# Patient Record
Sex: Female | Born: 1975 | Race: Black or African American | Hispanic: No | Marital: Married | Smoking: Never smoker
Health system: Southern US, Community
[De-identification: ages and names within clinical notes are randomized; demographics above are authoritative.]

## PROBLEM LIST (undated history)

## (undated) DIAGNOSIS — G609 Hereditary and idiopathic neuropathy, unspecified: Secondary | ICD-10-CM

## (undated) DIAGNOSIS — M2241 Chondromalacia patellae, right knee: Secondary | ICD-10-CM

## (undated) DIAGNOSIS — L299 Pruritus, unspecified: Secondary | ICD-10-CM

## (undated) DIAGNOSIS — M2240 Chondromalacia patellae, unspecified knee: Secondary | ICD-10-CM

## (undated) DIAGNOSIS — R600 Localized edema: Secondary | ICD-10-CM

## (undated) DIAGNOSIS — F329 Major depressive disorder, single episode, unspecified: Secondary | ICD-10-CM

## (undated) DIAGNOSIS — R112 Nausea with vomiting, unspecified: Secondary | ICD-10-CM

## (undated) DIAGNOSIS — R252 Cramp and spasm: Secondary | ICD-10-CM

## (undated) DIAGNOSIS — K219 Gastro-esophageal reflux disease without esophagitis: Secondary | ICD-10-CM

## (undated) DIAGNOSIS — D509 Iron deficiency anemia, unspecified: Secondary | ICD-10-CM

## (undated) DIAGNOSIS — D649 Anemia, unspecified: Secondary | ICD-10-CM

## (undated) DIAGNOSIS — M1990 Unspecified osteoarthritis, unspecified site: Secondary | ICD-10-CM

## (undated) DIAGNOSIS — R200 Anesthesia of skin: Secondary | ICD-10-CM

## (undated) DIAGNOSIS — G4733 Obstructive sleep apnea (adult) (pediatric): Secondary | ICD-10-CM

## (undated) DIAGNOSIS — J309 Allergic rhinitis, unspecified: Secondary | ICD-10-CM

## (undated) DIAGNOSIS — IMO0001 Reserved for inherently not codable concepts without codable children: Secondary | ICD-10-CM

## (undated) DIAGNOSIS — R109 Unspecified abdominal pain: Secondary | ICD-10-CM

## (undated) DIAGNOSIS — L2089 Other atopic dermatitis: Secondary | ICD-10-CM

## (undated) DIAGNOSIS — F4321 Adjustment disorder with depressed mood: Secondary | ICD-10-CM

## (undated) DIAGNOSIS — M25569 Pain in unspecified knee: Secondary | ICD-10-CM

## (undated) DIAGNOSIS — R202 Paresthesia of skin: Secondary | ICD-10-CM

## (undated) DIAGNOSIS — F502 Bulimia nervosa: Secondary | ICD-10-CM

## (undated) DIAGNOSIS — Z8719 Personal history of other diseases of the digestive system: Secondary | ICD-10-CM

## (undated) DIAGNOSIS — M722 Plantar fascial fibromatosis: Secondary | ICD-10-CM

## (undated) DIAGNOSIS — R011 Cardiac murmur, unspecified: Secondary | ICD-10-CM

## (undated) DIAGNOSIS — R231 Pallor: Secondary | ICD-10-CM

## (undated) DIAGNOSIS — Z9889 Other specified postprocedural states: Secondary | ICD-10-CM

## (undated) DIAGNOSIS — L281 Prurigo nodularis: Secondary | ICD-10-CM

## (undated) DIAGNOSIS — M75100 Unspecified rotator cuff tear or rupture of unspecified shoulder, not specified as traumatic: Secondary | ICD-10-CM

## (undated) HISTORY — DX: Unspecified rotator cuff tear or rupture of unspecified shoulder, not specified as traumatic: M75.100

## (undated) HISTORY — DX: Pallor: R23.1

## (undated) HISTORY — DX: Anemia, unspecified: D64.9

## (undated) HISTORY — DX: Hereditary and idiopathic neuropathy, unspecified: G60.9

## (undated) HISTORY — DX: Pruritus, unspecified: L29.9

## (undated) HISTORY — DX: Cramp and spasm: R25.2

## (undated) HISTORY — DX: Allergic rhinitis, unspecified: J30.9

## (undated) HISTORY — DX: Bulimia nervosa: F50.2

## (undated) HISTORY — DX: Paresthesia of skin: R20.2

## (undated) HISTORY — DX: Other atopic dermatitis: L20.89

## (undated) HISTORY — DX: Pain in unspecified knee: M25.569

## (undated) HISTORY — DX: Prurigo nodularis: L28.1

## (undated) HISTORY — DX: Major depressive disorder, single episode, unspecified: F32.9

## (undated) HISTORY — DX: Reserved for inherently not codable concepts without codable children: IMO0001

## (undated) HISTORY — DX: Unspecified abdominal pain: R10.9

## (undated) HISTORY — DX: Adjustment disorder with depressed mood: F43.21

## (undated) HISTORY — DX: Plantar fascial fibromatosis: M72.2

---

## 1997-05-03 ENCOUNTER — Inpatient Hospital Stay (HOSPITAL_COMMUNITY): Admission: AD | Admit: 1997-05-03 | Discharge: 1997-05-03 | Payer: Self-pay | Admitting: Obstetrics & Gynecology

## 1997-05-09 ENCOUNTER — Encounter (HOSPITAL_COMMUNITY): Admission: RE | Admit: 1997-05-09 | Discharge: 1997-05-12 | Payer: Self-pay | Admitting: Obstetrics & Gynecology

## 1997-05-12 ENCOUNTER — Inpatient Hospital Stay (HOSPITAL_COMMUNITY): Admission: AD | Admit: 1997-05-12 | Discharge: 1997-05-15 | Payer: Self-pay | Admitting: *Deleted

## 1997-07-20 ENCOUNTER — Encounter: Admission: RE | Admit: 1997-07-20 | Discharge: 1997-07-20 | Payer: Self-pay | Admitting: Family Medicine

## 1997-10-02 ENCOUNTER — Emergency Department (HOSPITAL_COMMUNITY): Admission: EM | Admit: 1997-10-02 | Discharge: 1997-10-02 | Payer: Self-pay | Admitting: Emergency Medicine

## 1998-01-30 ENCOUNTER — Encounter: Admission: RE | Admit: 1998-01-30 | Discharge: 1998-01-30 | Payer: Self-pay | Admitting: Family Medicine

## 1998-02-20 ENCOUNTER — Encounter: Payer: Self-pay | Admitting: Emergency Medicine

## 1998-02-20 ENCOUNTER — Emergency Department (HOSPITAL_COMMUNITY): Admission: EM | Admit: 1998-02-20 | Discharge: 1998-02-20 | Payer: Self-pay | Admitting: Emergency Medicine

## 1998-02-24 ENCOUNTER — Encounter: Admission: RE | Admit: 1998-02-24 | Discharge: 1998-02-24 | Payer: Self-pay | Admitting: Family Medicine

## 1998-04-17 ENCOUNTER — Encounter: Admission: RE | Admit: 1998-04-17 | Discharge: 1998-04-17 | Payer: Self-pay | Admitting: Family Medicine

## 1999-02-06 ENCOUNTER — Emergency Department (HOSPITAL_COMMUNITY): Admission: EM | Admit: 1999-02-06 | Discharge: 1999-02-06 | Payer: Self-pay | Admitting: Emergency Medicine

## 1999-03-02 ENCOUNTER — Emergency Department (HOSPITAL_COMMUNITY): Admission: EM | Admit: 1999-03-02 | Discharge: 1999-03-02 | Payer: Self-pay | Admitting: Emergency Medicine

## 1999-03-02 ENCOUNTER — Encounter: Payer: Self-pay | Admitting: Emergency Medicine

## 1999-03-14 ENCOUNTER — Encounter: Admission: RE | Admit: 1999-03-14 | Discharge: 1999-03-14 | Payer: Self-pay | Admitting: Family Medicine

## 1999-07-20 ENCOUNTER — Emergency Department (HOSPITAL_COMMUNITY): Admission: EM | Admit: 1999-07-20 | Discharge: 1999-07-20 | Payer: Self-pay | Admitting: Emergency Medicine

## 1999-09-10 ENCOUNTER — Encounter: Admission: RE | Admit: 1999-09-10 | Discharge: 1999-09-10 | Payer: Self-pay | Admitting: Family Medicine

## 2001-02-03 ENCOUNTER — Encounter: Admission: RE | Admit: 2001-02-03 | Discharge: 2001-02-03 | Payer: Self-pay | Admitting: Family Medicine

## 2001-05-21 ENCOUNTER — Encounter: Admission: RE | Admit: 2001-05-21 | Discharge: 2001-05-21 | Payer: Self-pay | Admitting: Family Medicine

## 2001-08-17 ENCOUNTER — Encounter: Admission: RE | Admit: 2001-08-17 | Discharge: 2001-08-17 | Payer: Self-pay | Admitting: Sports Medicine

## 2001-08-26 ENCOUNTER — Encounter: Admission: RE | Admit: 2001-08-26 | Discharge: 2001-08-26 | Payer: Self-pay | Admitting: Family Medicine

## 2001-09-23 ENCOUNTER — Encounter: Admission: RE | Admit: 2001-09-23 | Discharge: 2001-09-23 | Payer: Self-pay | Admitting: Family Medicine

## 2002-02-03 ENCOUNTER — Encounter: Admission: RE | Admit: 2002-02-03 | Discharge: 2002-02-03 | Payer: Self-pay | Admitting: Family Medicine

## 2002-03-24 ENCOUNTER — Emergency Department (HOSPITAL_COMMUNITY): Admission: EM | Admit: 2002-03-24 | Discharge: 2002-03-24 | Payer: Self-pay | Admitting: Emergency Medicine

## 2002-06-23 ENCOUNTER — Encounter: Admission: RE | Admit: 2002-06-23 | Discharge: 2002-06-23 | Payer: Self-pay | Admitting: Family Medicine

## 2002-06-24 ENCOUNTER — Encounter: Admission: RE | Admit: 2002-06-24 | Discharge: 2002-06-24 | Payer: Self-pay | Admitting: Family Medicine

## 2003-01-31 ENCOUNTER — Encounter: Admission: RE | Admit: 2003-01-31 | Discharge: 2003-01-31 | Payer: Self-pay | Admitting: Family Medicine

## 2003-02-09 ENCOUNTER — Inpatient Hospital Stay (HOSPITAL_COMMUNITY): Admission: AD | Admit: 2003-02-09 | Discharge: 2003-02-09 | Payer: Self-pay | Admitting: Obstetrics and Gynecology

## 2003-02-16 ENCOUNTER — Other Ambulatory Visit: Admission: RE | Admit: 2003-02-16 | Discharge: 2003-02-16 | Payer: Self-pay | Admitting: Family Medicine

## 2003-02-16 ENCOUNTER — Encounter: Admission: RE | Admit: 2003-02-16 | Discharge: 2003-02-16 | Payer: Self-pay | Admitting: Family Medicine

## 2003-03-22 ENCOUNTER — Encounter: Admission: RE | Admit: 2003-03-22 | Discharge: 2003-03-22 | Payer: Self-pay | Admitting: Family Medicine

## 2003-04-15 ENCOUNTER — Inpatient Hospital Stay (HOSPITAL_COMMUNITY): Admission: AD | Admit: 2003-04-15 | Discharge: 2003-04-15 | Payer: Self-pay | Admitting: *Deleted

## 2003-04-22 ENCOUNTER — Encounter: Admission: RE | Admit: 2003-04-22 | Discharge: 2003-04-22 | Payer: Self-pay | Admitting: Family Medicine

## 2003-04-28 ENCOUNTER — Ambulatory Visit (HOSPITAL_COMMUNITY): Admission: RE | Admit: 2003-04-28 | Discharge: 2003-04-28 | Payer: Self-pay | Admitting: Sports Medicine

## 2003-05-23 ENCOUNTER — Encounter: Admission: RE | Admit: 2003-05-23 | Discharge: 2003-05-23 | Payer: Self-pay | Admitting: Family Medicine

## 2003-06-06 ENCOUNTER — Encounter: Admission: RE | Admit: 2003-06-06 | Discharge: 2003-06-06 | Payer: Self-pay | Admitting: Family Medicine

## 2003-07-04 ENCOUNTER — Encounter: Admission: RE | Admit: 2003-07-04 | Discharge: 2003-07-04 | Payer: Self-pay | Admitting: Family Medicine

## 2003-07-18 ENCOUNTER — Encounter: Admission: RE | Admit: 2003-07-18 | Discharge: 2003-07-18 | Payer: Self-pay | Admitting: Family Medicine

## 2003-07-22 ENCOUNTER — Inpatient Hospital Stay (HOSPITAL_COMMUNITY): Admission: AD | Admit: 2003-07-22 | Discharge: 2003-07-22 | Payer: Self-pay | Admitting: Obstetrics and Gynecology

## 2003-08-03 ENCOUNTER — Encounter: Admission: RE | Admit: 2003-08-03 | Discharge: 2003-08-03 | Payer: Self-pay | Admitting: Family Medicine

## 2003-08-15 ENCOUNTER — Encounter: Admission: RE | Admit: 2003-08-15 | Discharge: 2003-08-15 | Payer: Self-pay | Admitting: Family Medicine

## 2003-08-23 ENCOUNTER — Encounter: Admission: RE | Admit: 2003-08-23 | Discharge: 2003-08-23 | Payer: Self-pay | Admitting: Family Medicine

## 2003-08-23 ENCOUNTER — Inpatient Hospital Stay (HOSPITAL_COMMUNITY): Admission: AD | Admit: 2003-08-23 | Discharge: 2003-08-24 | Payer: Self-pay | Admitting: *Deleted

## 2003-08-25 ENCOUNTER — Encounter: Admission: RE | Admit: 2003-08-25 | Discharge: 2003-08-25 | Payer: Self-pay | Admitting: Family Medicine

## 2003-08-29 ENCOUNTER — Encounter: Admission: RE | Admit: 2003-08-29 | Discharge: 2003-08-29 | Payer: Self-pay | Admitting: *Deleted

## 2003-09-01 ENCOUNTER — Encounter: Admission: RE | Admit: 2003-09-01 | Discharge: 2003-09-01 | Payer: Self-pay | Admitting: Family Medicine

## 2003-09-01 ENCOUNTER — Inpatient Hospital Stay (HOSPITAL_COMMUNITY): Admission: AD | Admit: 2003-09-01 | Discharge: 2003-09-01 | Payer: Self-pay | Admitting: Family Medicine

## 2003-09-05 ENCOUNTER — Encounter: Admission: RE | Admit: 2003-09-05 | Discharge: 2003-09-05 | Payer: Self-pay | Admitting: Obstetrics & Gynecology

## 2003-09-08 ENCOUNTER — Encounter: Admission: RE | Admit: 2003-09-08 | Discharge: 2003-09-08 | Payer: Self-pay | Admitting: Family Medicine

## 2003-09-12 ENCOUNTER — Inpatient Hospital Stay (HOSPITAL_COMMUNITY): Admission: AD | Admit: 2003-09-12 | Discharge: 2003-09-19 | Payer: Self-pay | Admitting: Family Medicine

## 2003-09-12 ENCOUNTER — Encounter: Admission: RE | Admit: 2003-09-12 | Discharge: 2003-09-12 | Payer: Self-pay | Admitting: *Deleted

## 2003-09-16 ENCOUNTER — Encounter (INDEPENDENT_AMBULATORY_CARE_PROVIDER_SITE_OTHER): Payer: Self-pay | Admitting: Specialist

## 2003-09-16 HISTORY — PX: TUBAL LIGATION: SHX77

## 2003-09-22 ENCOUNTER — Encounter: Admission: RE | Admit: 2003-09-22 | Discharge: 2003-09-22 | Payer: Self-pay | Admitting: Family Medicine

## 2003-10-21 ENCOUNTER — Encounter: Admission: RE | Admit: 2003-10-21 | Discharge: 2003-10-21 | Payer: Self-pay | Admitting: Family Medicine

## 2006-05-08 ENCOUNTER — Emergency Department (HOSPITAL_COMMUNITY): Admission: EM | Admit: 2006-05-08 | Discharge: 2006-05-08 | Payer: Self-pay | Admitting: Family Medicine

## 2006-05-13 ENCOUNTER — Ambulatory Visit: Payer: Self-pay | Admitting: Family Medicine

## 2006-06-09 ENCOUNTER — Emergency Department (HOSPITAL_COMMUNITY): Admission: EM | Admit: 2006-06-09 | Discharge: 2006-06-09 | Payer: Self-pay | Admitting: Emergency Medicine

## 2006-06-24 ENCOUNTER — Emergency Department (HOSPITAL_COMMUNITY): Admission: EM | Admit: 2006-06-24 | Discharge: 2006-06-25 | Payer: Self-pay | Admitting: Emergency Medicine

## 2007-03-11 ENCOUNTER — Emergency Department (HOSPITAL_COMMUNITY): Admission: EM | Admit: 2007-03-11 | Discharge: 2007-03-11 | Payer: Self-pay | Admitting: Family Medicine

## 2007-06-24 ENCOUNTER — Emergency Department (HOSPITAL_COMMUNITY): Admission: EM | Admit: 2007-06-24 | Discharge: 2007-06-24 | Payer: Self-pay | Admitting: Emergency Medicine

## 2008-01-17 ENCOUNTER — Emergency Department (HOSPITAL_COMMUNITY): Admission: EM | Admit: 2008-01-17 | Discharge: 2008-01-17 | Payer: Self-pay | Admitting: Family Medicine

## 2008-07-06 ENCOUNTER — Ambulatory Visit: Payer: Self-pay | Admitting: Family Medicine

## 2008-07-06 DIAGNOSIS — J309 Allergic rhinitis, unspecified: Secondary | ICD-10-CM

## 2008-07-06 DIAGNOSIS — L299 Pruritus, unspecified: Secondary | ICD-10-CM

## 2008-07-06 HISTORY — DX: Allergic rhinitis, unspecified: J30.9

## 2008-07-06 HISTORY — DX: Morbid (severe) obesity due to excess calories: E66.01

## 2008-07-06 HISTORY — DX: Pruritus, unspecified: L29.9

## 2008-08-04 ENCOUNTER — Ambulatory Visit: Payer: Self-pay | Admitting: Family Medicine

## 2008-10-31 ENCOUNTER — Emergency Department (HOSPITAL_COMMUNITY): Admission: EM | Admit: 2008-10-31 | Discharge: 2008-10-31 | Payer: Self-pay | Admitting: Emergency Medicine

## 2008-11-01 ENCOUNTER — Encounter (INDEPENDENT_AMBULATORY_CARE_PROVIDER_SITE_OTHER): Payer: Self-pay | Admitting: *Deleted

## 2008-11-01 ENCOUNTER — Ambulatory Visit: Payer: Self-pay | Admitting: Sports Medicine

## 2008-11-01 DIAGNOSIS — M25569 Pain in unspecified knee: Secondary | ICD-10-CM | POA: Insufficient documentation

## 2008-11-01 HISTORY — DX: Pain in unspecified knee: M25.569

## 2009-06-08 ENCOUNTER — Emergency Department (HOSPITAL_COMMUNITY): Admission: EM | Admit: 2009-06-08 | Discharge: 2009-06-08 | Payer: Self-pay | Admitting: Family Medicine

## 2009-07-04 ENCOUNTER — Telehealth: Payer: Self-pay | Admitting: Family Medicine

## 2009-07-04 ENCOUNTER — Ambulatory Visit: Payer: Self-pay | Admitting: Family Medicine

## 2009-07-04 DIAGNOSIS — IMO0002 Reserved for concepts with insufficient information to code with codable children: Secondary | ICD-10-CM | POA: Insufficient documentation

## 2009-07-04 DIAGNOSIS — F4321 Adjustment disorder with depressed mood: Secondary | ICD-10-CM

## 2009-07-04 DIAGNOSIS — R252 Cramp and spasm: Secondary | ICD-10-CM

## 2009-07-04 DIAGNOSIS — R109 Unspecified abdominal pain: Secondary | ICD-10-CM

## 2009-07-04 DIAGNOSIS — M75100 Unspecified rotator cuff tear or rupture of unspecified shoulder, not specified as traumatic: Secondary | ICD-10-CM

## 2009-07-04 HISTORY — DX: Cramp and spasm: R25.2

## 2009-07-04 HISTORY — DX: Unspecified rotator cuff tear or rupture of unspecified shoulder, not specified as traumatic: M75.100

## 2009-07-04 HISTORY — DX: Unspecified abdominal pain: R10.9

## 2009-07-04 HISTORY — DX: Adjustment disorder with depressed mood: F43.21

## 2009-07-04 HISTORY — DX: Reserved for concepts with insufficient information to code with codable children: IMO0002

## 2009-07-05 ENCOUNTER — Encounter: Payer: Self-pay | Admitting: Family Medicine

## 2009-07-05 ENCOUNTER — Ambulatory Visit: Payer: Self-pay | Admitting: Family Medicine

## 2009-07-05 LAB — CONVERTED CEMR LAB
BUN: 15 mg/dL (ref 6–23)
Chloride: 103 meq/L (ref 96–112)
Potassium: 4.1 meq/L (ref 3.5–5.3)

## 2009-07-06 ENCOUNTER — Encounter: Payer: Self-pay | Admitting: Family Medicine

## 2009-07-07 ENCOUNTER — Telehealth: Payer: Self-pay | Admitting: Psychology

## 2009-07-08 ENCOUNTER — Encounter: Payer: Self-pay | Admitting: Family Medicine

## 2009-07-17 ENCOUNTER — Ambulatory Visit: Payer: Self-pay | Admitting: Family Medicine

## 2009-07-17 DIAGNOSIS — R231 Pallor: Secondary | ICD-10-CM | POA: Insufficient documentation

## 2009-07-17 HISTORY — DX: Pallor: R23.1

## 2009-07-17 LAB — CONVERTED CEMR LAB
CO2: 24 meq/L (ref 19–32)
Creatinine, Ser: 1.16 mg/dL (ref 0.40–1.20)
Glucose, Bld: 97 mg/dL (ref 70–99)
HCT: 35.4 % — ABNORMAL LOW (ref 36.0–46.0)
MCV: 73.8 fL — ABNORMAL LOW (ref 78.0–100.0)
Platelets: 280 10*3/uL (ref 150–400)
RBC: 4.8 M/uL (ref 3.87–5.11)
TSH: 0.747 microintl units/mL (ref 0.350–4.500)
Total Bilirubin: 0.5 mg/dL (ref 0.3–1.2)
WBC: 7 10*3/uL (ref 4.0–10.5)

## 2009-07-20 ENCOUNTER — Encounter: Admission: RE | Admit: 2009-07-20 | Discharge: 2009-07-20 | Payer: Self-pay | Admitting: Family Medicine

## 2009-08-09 ENCOUNTER — Encounter (INDEPENDENT_AMBULATORY_CARE_PROVIDER_SITE_OTHER): Payer: Self-pay | Admitting: *Deleted

## 2009-08-09 ENCOUNTER — Ambulatory Visit: Payer: Self-pay | Admitting: Family Medicine

## 2009-08-09 DIAGNOSIS — F3289 Other specified depressive episodes: Secondary | ICD-10-CM

## 2009-08-09 DIAGNOSIS — F329 Major depressive disorder, single episode, unspecified: Secondary | ICD-10-CM | POA: Insufficient documentation

## 2009-08-09 HISTORY — DX: Major depressive disorder, single episode, unspecified: F32.9

## 2009-08-09 HISTORY — DX: Other specified depressive episodes: F32.89

## 2009-08-14 ENCOUNTER — Encounter: Payer: Self-pay | Admitting: Family Medicine

## 2009-08-17 ENCOUNTER — Ambulatory Visit: Payer: Self-pay | Admitting: Family Medicine

## 2009-08-22 ENCOUNTER — Ambulatory Visit: Payer: Self-pay | Admitting: Family Medicine

## 2009-08-22 DIAGNOSIS — F502 Bulimia nervosa, unspecified: Secondary | ICD-10-CM

## 2009-08-22 HISTORY — DX: Bulimia nervosa: F50.2

## 2009-08-22 HISTORY — DX: Bulimia nervosa, unspecified: F50.20

## 2009-08-25 ENCOUNTER — Telehealth: Payer: Self-pay | Admitting: Family Medicine

## 2009-08-29 ENCOUNTER — Telehealth: Payer: Self-pay | Admitting: Family Medicine

## 2009-08-30 ENCOUNTER — Telehealth: Payer: Self-pay | Admitting: *Deleted

## 2009-08-30 ENCOUNTER — Ambulatory Visit: Payer: Self-pay | Admitting: Family Medicine

## 2009-08-30 DIAGNOSIS — L2089 Other atopic dermatitis: Secondary | ICD-10-CM | POA: Insufficient documentation

## 2009-08-30 HISTORY — DX: Other atopic dermatitis: L20.89

## 2009-08-30 LAB — CONVERTED CEMR LAB: Vitamin B-12: 350 pg/mL (ref 211–911)

## 2009-08-31 ENCOUNTER — Telehealth: Payer: Self-pay | Admitting: *Deleted

## 2009-09-08 ENCOUNTER — Telehealth: Payer: Self-pay | Admitting: Family Medicine

## 2009-09-13 ENCOUNTER — Telehealth: Payer: Self-pay | Admitting: Family Medicine

## 2009-11-06 ENCOUNTER — Telehealth: Payer: Self-pay | Admitting: Family Medicine

## 2009-11-14 ENCOUNTER — Telehealth: Payer: Self-pay | Admitting: Family Medicine

## 2009-11-29 ENCOUNTER — Telehealth: Payer: Self-pay | Admitting: Family Medicine

## 2010-04-24 NOTE — Progress Notes (Signed)
Summary: called to check on pt  Phone Note Outgoing Call   Call placed by: Kraven Calk Swaziland MD,  November 14, 2009 1:48 PM Summary of Call: Called pt to follow up on call last week.  LM to call us if we can help with anything.

## 2010-04-24 NOTE — Progress Notes (Signed)
Summary: wants shoulder injection, other issues  Phone Note Call from Patient   Caller: Patient Call For: Nia Nathaniel [place] MD Summary of Call: Please call at your earliest convenience.  Called HR dept and they informed me that I have to be out within 7 consecutive days and was given approval for my medical leave.  They will fax papers for you to sign and fax back.  I need you to give me a call for further questions that I have regarding bilat leg spasms.  You can reach me at [phone].    Initial call taken by: Britta Mccreedy mcgregor  Follow-up for Phone Call        she is aware of appt this pm with Dr. Rexene Alberts for a shoulder injection per Dr. Swaziland. she will keep the appt Follow-up by: Golden Circle RN,  July 05, 2009 9:02 AM  Additional Follow-up for Phone Call Additional follow up Details #1::        Pt calls with several issues: 1)Wants to have shoulder injected today. Pain was worse last night.  Will schedule appt as work in today. 2) In addition to abd. pain, having very foul smelling gas. Would like GI referral.   No diarrhea, constipation, vomiting.  + frequent stools.  No blood or melena.  Gas started a few days after she started colon cleanse, which she stopped due to pain and gas.  No change with eating.  I think this may be related to colon cleanse, perhaps brush border disrupted.  Try yogurt with active cultures for several days and let us know if no relief.   3) Muscle spasms.  Has cramping in legs and feet.  Occurs several times a week, no exacerbating factors.  Improved with warm rags on legs or massage.  Occurs B.  Very painful.  Pt wonders if her potassium levels are off.  Will check BMP today when she comes in.   4)She  discussed her situation with HR at work.  She needs to be off for 7 days before leave can start.  THey will fax paperwork to [place].  I am ok with doing some FMLA for this pt to give her time to get established with therapy and to grieve as her grief is affecting her  ability to function.  Additional Follow-up by: Arrianna Catala [place] MD,  July 05, 2009 10:13 AM  New Problems: MUSCLE CRAMPS (ICD-729.82)   New Problems: MUSCLE CRAMPS (ICD-729.82)

## 2010-04-24 NOTE — Letter (Signed)
Summary: normal lab  [facility]  [address]   [place], [place] [address]   Phone: [phone]  Fax: [phone]    07/06/2009  [facility] [address] [place], [place]  [address]  Dear Ms. KANG, I am happy to tell you that your labs were normal, including your potassium.  For the leg pain, you can try the stretches that I have included here.  You should also make sure you are drinking plenty of water.  If that doesn't help, you might want to try Vitamin B6, 30 mg a day to see if that improves your pain. Please let us know if you need anything.     Sincerely,   Malik Paar [place] MD

## 2010-04-24 NOTE — Progress Notes (Signed)
Summary: left message for pt.  Phone Note Outgoing Call   Summary of Call: Attempted to call pt.  LM on home VM.  I tried the other number that she left with Phillis Haggis, but it went to someone else's VM at work, so did not leave a message. Initial call taken by: Lilie Vezina Swaz[place] MD,  November 29, 2009 12:10 PM

## 2010-04-24 NOTE — Progress Notes (Signed)
Summary: Grief counseling  Phone Note Call from Patient   Caller: Patient Call For: Spero Geralds, Psy.D. Summary of Call: Patient called to schedule appt per Dr. Elvis Coil request.  Explained that I don't see family members of employees because of boundary issues.  She said she understood this completely.  Recommended Hospice because of the great work they do with grief however patient had a bad experience with them related to a family friend.  Discussed other options.  She elected to call her insurance company for a list of providers.  She will either contact some by phone or call me for a recommendation.  Will alert Dr. Swaziland. Initial call taken by: Spero Geralds PsyD,  July 07, 2009 11:28 AM

## 2010-04-24 NOTE — Progress Notes (Signed)
Summary: phn msg  Phone Note Call from Patient Call back at [phone]   Caller: Patient Summary of Call: Just needs to talk to Dr. Swaziland about something. Initial call taken by: Clydell Hakim,  August 25, 2009 11:29 AM  Follow-up for Phone Call        Called pt.  No answer.  Left message to call clinic. Pt does have other appts today. Follow-up by: Oksana Deberry [place] MD,  August 28, 2009 10:52 AM    forward to pcp.Loralee Pacas CMA  August 25, 2009 12:19 PM'

## 2010-04-24 NOTE — Assessment & Plan Note (Signed)
Summary: F/U VISIT/[facility]   Vital Signs:  Patient profile:   35 year old female Height:      66.5 inches Weight:      281 pounds BMI:     44.84 Pulse rate:   80 / minute BP sitting:   120 / 80  (left arm)  Vitals Entered By: Arlyss Repress CMA, (Aug 17, 2009 10:19 AM) CC: f/up shoulder pain and FMLA form Is Patient Diabetic? No Pain Assessment Patient in pain? yes     Location: shoulder Intensity: 8 Onset of pain  Chronic   Primary Care Provider:  Cyndia Bent MD  CC:  f/up shoulder pain and FMLA form.  History of Present Illness: PT has not started the medicine for financial reasons.  Lots of co-pays coming up.  Has appt with ortho in June.  Still with lots of pain in shoulder. Still seeing Ms. Leff.  Has chart of activities and charting moods.  Reports her mood hit severe category on lots of parameters from Ms. Leff.  Still voices, this am had them at 3 am.  Tries to write when she has episodes.  Voices positive and tell her to snap out of this. Some days are really tough, and some are ok.  Still with crying spells.  + spending spree at home.  No SI/HI.    + angry feeling at times.     Habits & Providers  Alcohol-Tobacco-Diet     Tobacco Status: never  Allergies: No Known Drug Allergies  Review of Systems       see HPI  Physical Exam  General:  Well-developed,obese,in no acute distress; alert,appropriate and cooperative throughout examination Psych:  Pt reports she made an effort this morning, and she appears more dressed up (hair done, make-up) than in previous visits.  Still with some prolonged eye-contact and somewhat flat affect.  Non-labile.  Nl TC and TP without FOI or LOA.  Denies SI/HI   Impression & Recommendations:  Problem # 1:  DEPRESSION, SEVERE (ICD-311)  Pt still with psychotic features given her auditory hallucinations.  She does not want to take meds, but is willing to try them.  Has fears that she will be on them forever and of weight gain.  I  emphasized that the meds are very important.  Recent spendind sprees concerning for mania.  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  Also with eating disorder, but that has not been addressed yet in therapy.  Pt contracts for safety.  Orders: [facility]- Est Level  3 ([address])  Problem # 2:  SUBACROMIAL BURSITIS, RIGHT (ICD-726.19) F/u ortho.  Complete Medication List: 1)  Fluticasone Propionate 50 Mcg/act Susp (Fluticasone propionate) .... 2 sprays each nostril once daily 2)  Zyrtec Allergy 10 Mg Tabs (Cetirizine hcl) .Marland Kitchen.. 1 once daily prn 3)  Abilify 15 Mg Tabs (Aripiprazole) .... Take 1 by mouth daily  Patient Instructions: 1)  Please come back and see me in 1 week.   2)  It is important to get started on the meds.  3)  Let us know if you are having any problems.

## 2010-04-24 NOTE — Letter (Signed)
Summary: Generic Letter  [facility]  [address]   [place], [place] [address]   Phone: [phone]  Fax: [phone]    08/09/2009  [facility] [place] [address] [place], [place]  [address]  Dear Ms. SIGMUND,  Dr Swaziland wanted you to have an appointment with an Orthopedic doctor.  I have set you up an appointment on June 7th at [facility] with [facility].  Their phone number is [phone], you can call them for directions to their office or if you need to change this appointment.  I was not able to reach you by phone due to your mailbox being full.  Sincerely,   Gladstone Pih

## 2010-04-24 NOTE — Assessment & Plan Note (Signed)
Summary: follow up   Vital Signs:  Patient profile:   35 year old female Height:      66.5 inches Weight:      275.8 pounds BMI:     44.01 Temp:     98.2 degrees F oral Pulse rate:   90 / minute BP sitting:   112 / 80  (left arm) Cuff size:   large  Vitals Entered By: Gladstone Pih (Aug 22, 2009 9:14 AM) CC: F/U Is Patient Diabetic? No   Primary Care Provider:  Sarah Swaziland MD  CC:  F/U.  History of Present Illness: 35 yo female here to follow up.  Still feeling down, especally angry.  +Anhedonia, sleep problems, difficulty concentrating, no guilt, decreased energy.  Denies SI/HI.  Did not get meds because her disability payment from work was not processed yet.  Still binging/purging.  Vomits to prevent weight gain.   Husband has taken credit card away because she spent lots of money in her account.  Pt also reports episodes of cooking lots of food and then throwing it away so she won't eat it.    Reports feeling really angry - at co-workers for not sending cards - "I know that means they don't care", husband supportive, but she feels angry at him.  This is distressing to her because she used to be very comforted by him, but now does not want to be around him - it makes her angry that his is happy at times. He is home from work today because he worked this weekend.  Had intercourse recently and he did not ejaculate - wonders if this means he is cheating on her.    Pt still enjoys being with her daughter.  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  Daughter was home yesterday for holiday and pt reports they laughed all day, and she really cheers her up.  Daughter did stay home from school one day recently to be with pt.    Pt very concerned about weight gain and appearance.  "I feel like people are looking at me"  "I am really embarrassed because I don't look right".  Pt states that after her last visit  (when she dressed up), she got home and was very embarrassed because her eye shadow was not right and that she really felt like people were looking at her.    Pt did go walking with her mother last week, and felt good about that.  Pt asking if B12 shots could help get the weight off, if surgery could help.   Pt does not want to talk about voices.  Reports she is having images of her niece on the floor when she took her son to a doctors appt.    Continued:  Pt denies SI/HI, but states that when she gets the meds, she may take extra to get better quicker.  She intially states she doesn't know how many she would take, but then states she would not take more than 2. Repeatedly denies any thoughts of hurting self.  Pt reports a friend went to [employer], and she never wants to go there.  "They lock the doors and don't let your family visit".     Habits & Providers  Alcohol-Tobacco-Diet     Tobacco Status: never  Allergies: No Known Drug Allergies  Physical Exam  Psych:  Appropriate grooming and dress, but  not as dressed up as last visit (no make-up and hair not styled as she had done last visit).  See HPI for TC.  TP appropriate without FOI/LOA.  Speech not pressured.  +fidgeting during visit.  Does not appearing to be responding to internal stimuli.  Flat affect.  Guarded today.   Impression & Recommendations:  Problem # 1:  DEPRESSION, SEVERE (ICD-311)  I still think this is depression or perhaps BPAD with psychotic features.  Pt with some worsening of psychotic features, but not willing to discuss much today.  Has not started meds - will try to get today.  Voices understanding that taking more meds at once will be harmful and not helpful and agrees to take no more than 2.  Pt frustrated at the idea that it will likely take weeks to months for depression to improve.  Pt with poor insight into disease.  She does not like me to use the term "depression".   Pt would likely benefit from  hospitalization to stabilize and start and titrate meds, but she declined this.  At this point, she denies any SI/HI.  She contracts with safety and reports she will call Arletta Bale or our practice of go to ED if needed.  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  Pt interested in having this fixed, mostly in losing weight.  Asking about injections and surgery.  Surgery would not be an option for Ms. Colon until her binging/purging under control and her underlying psych condition improved.    Orders: [facility]- Est Level  3 ([address])  Problem # 3:  OBESITY, UNSPECIFIED (ICD-278.00) Pt asking what to do about weight.  Advised that we need to get depression under control and then work on this.  Encouraged daily walking.    Complete Medication List: 1)  Fluticasone Propionate 50 Mcg/act Susp (Fluticasone propionate) .... 2 sprays each nostril once daily 2)  Zyrtec Allergy 10 Mg Tabs (Cetirizine hcl) .Marland Kitchen.. 1 once daily prn 3)  Abilify 15 Mg Tabs (Aripiprazole) .... Take 1 by mouth daily  Patient Instructions: 1)  Please let us know if you can't get the medicine in the next 2 days. 2)  If you feel any worse, call Clerance Lav or [place] and let us know. 3)  Please call Clerance Lav and try to get to see her this week.   4)  Please come back and see me next week.

## 2010-04-24 NOTE — Progress Notes (Signed)
Summary: phn msg  Phone Note Call from Patient Call back at [phone]   Caller: Patient Summary of Call: pt is requesting to talk to Dr Swaziland Initial call taken by: De Nurse,  November 06, 2009 11:12 AM  Follow-up for Phone Call        Left messages at the 2 numbers, explaining that I am going out of town, but if there is anything urgent, someone else here could help her.  Pt is hooked in to resources through Child psychotherapist and psychiatrist, so if she has urgent mental health needs, she does have resources. Follow-up by: Sarah Swazil[place] MD,  November 06, 2009 2:00 PM

## 2010-04-24 NOTE — Assessment & Plan Note (Signed)
Summary: shoulder pain, grief rxn   Vital Signs:  Patient profile:   35 year old female Height:      66.5 inches Weight:      264.2 pounds BMI:     42.16 Temp:     98.3 degrees F oral Pulse rate:   90 / minute BP sitting:   122 / 81  (left arm) Cuff size:   large  Vitals Entered By: Gladstone Pih (July 04, 2009 10:13 AM) CC: NP, C/O abd pains, Right shoulder pain, anxiety Is Patient Diabetic? No Pain Assessment Patient in pain? no      Comments Abd pain started after she was taking diet pills (forgot to bring them with her) and doing a 7 day cleansing tX, Shoulder pain has been chronic for a long time, Family loss of Neice in Feb 2011-she was very close to her and was Godmother   Primary Care Provider:  Cyndia Bent MD  CC:  NP, C/O abd pains, Right shoulder pain, and anxiety.  History of Present Illness: 35 yo new to me pt presents with several issues:  Grief rxn: Pt's 63 yo niece died in May 14, 2022 during surgery for burst appendix.  Pt reports that she tries to be strong for everyone else, but that she is realizing she needs time to grieve herself.  This was her goddaughter, and she was very close to her.  Reports feeling really sad, crying, feeling short tempered with her husband, has images of her in the casket that she can't get out of her head.  Trouble sleeping.  Feels anxious and as though she is "at breaking point"  Affecting her function at work - actually considering quitting because she is having so much trouble.  No SI/HI.  Pt's mother works here and she is concerned about keeping this confidential from her mother.  Abd pain: Has pain in epigastric area, no change with eating, no radiation, no constipation or diarrhea.  +frequent BM.  No melena or hemoptysis.  Pain started a few days into 7 day colon cleanse.  She stopped the cleanse.  Also was taking diet pills from [employer], but stopped those, too.  In past, lost 8 pounds and now has gained 10-15 back.  Thinks  this is from eating sweets, which she has done since she lost her niece.  Shoulder pain: Has been going on for months. Worse when lying in bed.  Trying tramadol, heating pad, pain patches.  No relief.  Husband with rotator cuff problems; he told her this sounds like his symptoms and he had relief with injections prior to surgery, but the injections really hurt and she is nervous about this.    Habits & Providers  Alcohol-Tobacco-Diet     Tobacco Status: never  Allergies: No Known Drug Allergies  Social History: Daughter of Britta Mccreedy (front office).  No tobacco. Works full time. Is personal banker a Human resources officer.  Married.  Review of Systems       see HPI  Physical Exam  General:  Obese.  No distress.  Vitals noted. Lungs:  Normal respiratory effort, chest expands symmetrically. Lungs are clear to auscultation, no crackles or wheezes. Heart:  Normal rate and regular rhythm. S1 and S2 normal without gallop, murmur, click, rub or other extra sounds. Msk:  B shoulders without erythema, warmth, or bony deformities.  L shoulder TTP over greater tuberosity and subacromial bursa.  R without pain.  Limited abduction L shoulder to 90 degress.  LImited internal rotations, normal external  rotation.  +empty can test. R shoulder FROM Psych:  Normal grooming and dress.  Somewhat flat affect with good eye contact.  TC includes many references to being the person others in the family and community lean on for support. Nl TP without FOI or LOA.  Denies SI/HI    Impression & Recommendations:  Problem # 1:  SUBACROMIAL BURSITIS, LEFT (ICD-726.19)  Pt has tried meds, exercises and other attempts at pain management without relief.  Offered injection today, but pt declined due to her fears of it being very painful.  Continue medical management.  Pt to consider injection.  Would like to see Sports Medicine for this.  Orders: [facility]- Est Level  3 ([address])  Problem # 2:  GRIEF REACTION, ACUTE (ICD-309.0)  Pt's  symptoms seem to be normal grief rxn.  No SI/HI.  Will give trazadone for sleep, but not long term antidepressants for now or anti-anxiety meds now.  Pt would benefit from therapy and is interested in talking to someone outside of the family.  Willing to contact Dr. Pascal Lux for appt.  Pt concered about confidientialtiy since her mother works at family practice.  Reassurance given re: confidientiality.  Pt contracts for safety.    Orders: [facility]- Est Level  3 ([address])  Problem # 3:  ABDOMINAL PAIN (ICD-789.00)  No red flags.  Can try TUMS, ranitidine.  If no relief, consider H. Pylori testing.   Orders: [facility]- Est Level  3 ([address])  Complete Medication List: 1)  Fluticasone Propionate 50 Mcg/act Susp (Fluticasone propionate) .... 2 sprays each nostril once daily 2)  Zyrtec Allergy 10 Mg Tabs (Cetirizine hcl) .Marland Kitchen.. 1 once daily prn 3)  Trazodone Hcl 50 Mg Tabs (Trazodone hcl) .... Take 1 -2 by mouth at night for insomnia  Patient Instructions: 1)  Please make an appt in 2-3 weeks to come see me. 2)  Come back sooner if you need anything.   3)  We will set up an appt with Sports Medicine. Prescriptions: TRAZODONE HCL 50 MG TABS (TRAZODONE HCL) Take 1 -2 by mouth at night for insomnia  #30 x 1   Entered and Authorized by:   Sarah Swaziland MD   Signed by:   Sarah Swaziland MD on 07/04/2009   Method used:   Electron[facility] to        [facility]  [address] (retail)       [address]       [place], [place]  [address]       Ph: [phone] or [phone]       Fax: [phone]   RxID:   [id number]

## 2010-04-24 NOTE — Progress Notes (Signed)
Summary: lft msg about [facility] x 2  Phone Note Outgoing Call   Call placed by: Abigaelle Verley [place] MD,  September 08, 2009 3:11 PM Summary of Call: Pt DNKA's appt on 09/06/09.  I spoke with pt that day and she said she was doing ok, but she forgot the appt.  We rescheduled her for 11:15 the following day (09/07/09).  She again no-showed the appt.  Placed phone call today and left a message for her to please call the clinic.  Even though I am not in the office today, she can certainly leave a  message about how she is doing.

## 2010-04-24 NOTE — Letter (Signed)
Summary: Short term disability info  [facility]  [address]   [place], [place] [address]   Phone: [phone]  Fax: [phone]    08/30/2009  [facility] [place] [address] [place], [place]  [address]  To Whom it May Concern: Ms Duma is a patient under my care.  She came today for follow up (see notes).  In summary, I believe Ms. Boshers is still significantly disabled by her mental condition.  She has complied with all scheduled visits, and has started medicines for this. I am hopeful that the medicines will take effect soon and that she will eventually be able to return to work in her previous capacity.  Please feel free to contact me with any questions.   Sincerely,   Sarah [place]azil[place] MD  Appended Document: Short term disability info called pt to find out additional information for claim left vm for her to call back

## 2010-04-24 NOTE — Miscellaneous (Signed)
Summary: re: FMLA/TS  Clinical Lists Changes pt needs to submit information to [facility]. she is out on 'leave of absence'...started 07-03-09 and is still not able to go back to work. is under the care of psychologist. does not have a date to go back to work at this time. please update [employer] asap, so pt does not loose her job. also, please call the  pt at [phone]. i told the pt to call [employer] and have them fax a form to your attention. pt will call [employer] today. thank you, Arlyss Repress CMA,  Aug 14, 2009 11:25 AM   Patient called back and wanted to find out if form could be faxed back to company before Thursday because they will not issue her her paycheck if they don't have the necessary documents completed and signed by physician.  Pt is dependent on salary for payment of her mortgage and utilities.  Please complete and return to front office. Abundio Miu  Aug 14, 2009 4:11 PM  I will be happy to compete the form when it arrives.  Shafiq Larch [place] MD  Aug 16, 2009 10:39 PM  Spoke with Baxter Hire today.  We reviewed office notes and she said she could extend disability to 6/5.  Suzzette Gasparro [place] MD  Aug 17, 2009 9:11 PM

## 2010-04-24 NOTE — Progress Notes (Signed)
Summary: phn msg  Phone Note Call from Patient Call back at Home Phone [phone]   Caller: Patient Summary of Call: needs to talk to [facility] Initial call taken by: De Nurse,  August 30, 2009 2:48 PM  Follow-up for Phone Call        spoke with pt will fax letter that dr Swaziland wrote for her claim Follow-up by: Loralee Pacas CMA,  August 31, 2009 8:57 AM

## 2010-04-24 NOTE — Letter (Signed)
Summary: Generic Letter  [facility]  [address]   [place], [place] [address]   Phone: [phone]  Fax: [phone]    07/06/2009  [facility] [place] [address] [place], [place]  [address]  Dear [name],           Sincerely,   Aritzel Krusemark [place] MD

## 2010-04-24 NOTE — Progress Notes (Signed)
  Phone Note Outgoing Call   Call placed by: Celise Bazar [place] MD,  September 13, 2009 3:53 PM Call placed to: Patient Summary of Call: Pt reports she saw psychiatrist, who diagnosed Bipolar and really explained things well,  He prescribed an additional med, which she is hesitant to take, but felt very good about her appt with him.  Denies SI/HI.  Another niece died, so [name] is feeling sad about that and plans to go to the funeral next week. Reports she missed appts last week because she forgot the first one and did not feel like coming to the second one.   Pt also interested in being seen for bee sting that is not healing well. Reports it is getting expensive seeing Arletta Bale, me and the psychiatrist.  We discussed seeing the psychiatrist and Clerance Lav for mental health and me for other things.  She will consider this, but definitely plans to continue with her psychiatrist.  Will make an appt with me for after she returns from her neice's funeral in [place].

## 2010-04-24 NOTE — Progress Notes (Signed)
Summary: discussion with therapist  Phone Note Other Incoming   Summary of Call: Talked with Arletta Bale, LCSW, who saw Sara [name] yesterday.  She is concerned about Sara [name], feeling she is more psychotic. Sara [name] presented to that appt with her husband.  Has not started meds.  Has not received disability payments yet. + anger, but denies SI/HI. Ms Charlyne Mom mentioned hospitalization as an option, Sara [name] was very much against it.  Sara [name] encouraged to start meds.  Has follow up tomorrow with me. Called to check in with Sara [name].  No answer.  LM that I called. Initial call taken by: Hargun Spurling [place] MD,  August 29, 2009 10:44 AM

## 2010-04-24 NOTE — Assessment & Plan Note (Signed)
Summary: shoulder injection/Sara [name]/jordan   Vital Signs:  Patient profile:   35 year old female Height:      66.5 inches Weight:      267 pounds BMI:     42.60 BSA:     2.28 Temp:     99.1 degrees F Pulse rate:   78 / minute BP sitting:   112 / 76  Vitals Entered By: Jone Baseman CMA (July 05, 2009 3:19 PM) CC: SHOULDER INJECTION Is Patient Diabetic? No Pain Assessment Patient in pain? yes     Location: RIGHT SHOULDER Intensity: 4   Primary Care Provider:  Cyndia Bent MD  CC:  SHOULDER INJECTION.  History of Present Illness: Pt here for steroid injection into R shoulder for subacromial bursitis. All questions answered.   Habits & Providers  Alcohol-Tobacco-Diet     Tobacco Status: never  Allergies: No Known Drug Allergies  Physical Exam  Additional Exam:  Patient given informed consent for injection. Appropriate verbal time out taken. Area cleaned and prepped in usual sterile fashion. 1 cc kennalog plus 4 -cc 1% lidocaine without epinephrine was injected into the R shoulder joint and posterior bursa. Patient tolerated procedure well without any apparent complications.    Impression & Recommendations:  Problem # 1:  SUBACROMIAL BURSITIS, RIGHT (ICD-726.19) Assessment Unchanged  injection performed. Rotator cuff exercises given to help preserve strength and ROM.   Orders: Injection, large joint- [facility] (20610)  Complete Medication List: 1)  Fluticasone Propionate 50 Mcg/act Susp (Fluticasone propionate) .... 2 sprays each nostril once daily 2)  Zyrtec Allergy 10 Mg Tabs (Cetirizine hcl) .Marland Kitchen.. 1 once daily prn 3)  Trazodone Hcl 50 Mg Tabs (Trazodone hcl) .... Take 1 -2 by mouth at night for insomnia  Patient Instructions: 1)  keep exercising/moving your shoulder 2)  ice you shoulder at home tonight 3)  call for questions or concerns 4)  nice to meet you

## 2010-04-24 NOTE — Assessment & Plan Note (Signed)
Summary: F/U/KH   Vital Signs:  Patient profile:   35 year old female Height:      66.5 inches Weight:      277 pounds BMI:     44.20 Temp:     98.5 degrees F oral Pulse rate:   78 / minute BP sitting:   115 / 76  Vitals Entered ByCorene Cornea (Aug 09, 2009 10:13 AM) CC: F/U right arm pain Is Patient Diabetic? No Pain Assessment Patient in pain? yes     Location: right arm Intensity: 5   Primary Care Provider:  Cyndia Bent MD  CC:  F/U right arm pain.  History of Present Illness: Still with a lot of shoulder pain.  Tramadol not helping.  Pain anterior.  Tried exercises without relief.  INterested in ortho.  Wonders if it is in the bone.   Also with significant mental health concerns.  Pt states she is not "going crazy", but then states she is worried that is what is happening.  Thinks she may be depressed, but also seeing images of people and hearing voices.  Describes incident where she is sure someone said her name when she was home alone, and she locked the doors and left the house.  Sees images of people, but not their faces.  AH/VH not threatening, in fact, they tell her she should get help. Unable to discuss with family members.   Pt reports feeling so nervous that she had her 85 yo daughter stay home from school one day to be with her.  She does report feeling safe atthome without thoughts of suicide ore homicide.  Does care for children, but husband is often around.  He gets home at 2:30.   Pt also can go to father's house if she needs some support.   Pt feels her weight gain contributes to her depressed mood. Reports eating when she is stressed.     Habits & Providers  Alcohol-Tobacco-Diet     Tobacco Status: never  Allergies: No Known Drug Allergies  Social History: Daughter of Britta Mccreedy (front office).  No tobacco. Works full time. Is personal banker a Human resources officer.  Married. Cousin committed suicide 5/11.    Review of Systems       see HPI  Physical  Exam  General:  Obese.  no distress.  Vitals noted Psych:  Normal grooming and dress.  Prolonged eye contact.  TC includes description of not wanting family to know what's going on and wanteding to be better.  No FOI/LOA.     Impression & Recommendations:  Problem # 1:  DEPRESSION, SEVERE (ICD-311)  This may be depression with psychotic featutes.  Pt reports she is safe at home and caring for children.  Is willing to take a medicine, but is very concerned about weight gain. Contracts for safety.  Will start Abilligy 15 mg, may nwed to increase.  F/u with me in 1 week.  Pt discussed with Arletta Bale, LCSW yesterday.  She was very concerned about the psychotic features.  Pt has apt for Monday, May 23 with Ms. Charlyne Mom, and her husband might come , too.  SHe has follow upwith me  in 1 week and will contact us sooner if needed  Orders: [facility]- Est Level  3 ([address]40)  Problem # 2:  SUBACROMIAL BURSITIS, RIGHT (ICD-726.19) Pt wihtout relief from injection or pain meds.  Refer to ortho Orders: Orthopedic Referral (Ortho) [facility]- Est Level  3 ([id number])  Complete Medication List: 1)  Fluticasone  Propionate 50 Mcg/act Susp (Fluticasone propionate) .... 2 sprays each nostril once daily 2)  Zyrtec Allergy 10 Mg Tabs (Cetirizine hcl) .Marland Kitchen.. 1 once daily prn 3)  Abilify 15 Mg Tabs (Aripiprazole) .... Take 1 by mouth daily  Patient Instructions: 1)  Please call us if you feel any worse. 2)  Please schedule an appt with me next week.   3)  We will arrange for an appointment with the orthopedic doctors for your shoulder. 4)  Call us if you feel any worse. Prescriptions: ABILIFY 15 MG TABS (ARIPIPRAZOLE) Take 1 by mouth daily  #30 x 1   Entered and Authorized by:   Bobbye Petti [place] MD   Signed by:   Freddie Dymek [place] MD on 08/09/2009   Method used:   [facility]        [facility] [facility]       [address]       [place], [place]  [address]       Ph: [phone] or [phone]       Fax:  [phone]   RxID:   [id number]

## 2010-04-24 NOTE — Assessment & Plan Note (Signed)
Summary: f/u eo   Vital Signs:  Patient profile:   35 year old female Height:      66.5 inches Weight:      273 pounds BMI:     43.56 Temp:     98.2 degrees F oral Pulse rate:   89 / minute BP sitting:   114 / 74  (left arm) Cuff size:   large  Vitals Entered By: Tessie Fass CMA (July 17, 2009 10:02 AM) CC: F/U Is Patient Diabetic? No Pain Assessment Patient in pain? yes     Location: head Intensity: 5   Primary Care Provider:  Cyndia Bent MD  CC:  F/U.  History of Present Illness: 35 yo female here to follow up depression and wants to discuss several other issues:  Pt still with grief, but is looking for ways to process.  Put collage together of niece's pictures, not having flashbacks of her in the coffin.  Still with sig anxiety.  Contacted Dr. Pascal Lux for counseling, and has not looked for other resources ("I don't feel comfortable just looking in the phone book").Would like some specific names from [place].  Denies SI/HI.  Did not take trazadone, rests a lot during the day, sleeps well at night.   +crying spells.  Pt also feels her weight gain a is a contributor to her depressed mood.  She reports binging and purging about daily.  Concerned that this may contribute to her abdominal pain - perhaps she injured something.  Abd pain continues. Tried TUMS without relief.   Epigastric, sharp, no change with eating. No hematemesis, no melena, no diarrhea.  Flatulence has improved.  Did not try yogurt as suggested after her colon cleanse.  Pain does not occur with vomiting.    Pt had much improvement for 1 week after her shoulder injection.  Now with symptoms returning.  Feels she should get an MRI to evaluate.    Felt sick (sore throat) over the weekend.  Tried Chloraseptic. Thinks she may need ENT eval because she often gets white balls stuck in her throat that she coughs up and they smell bad.  Also, she thinks her tonsils are really large. No trouble breathing or swallowing.   Pt does endorse allergic symptoms.    Habits & Providers  Alcohol-Tobacco-Diet     Tobacco Status: never  Current Medications (verified): 1)  Fluticasone Propionate 50 Mcg/act  Susp (Fluticasone Propionate) .... 2 Sprays Each Nostril Once Daily 2)  Zyrtec Allergy 10 Mg  Tabs (Cetirizine Hcl) .Marland Kitchen.. 1 Once Daily Prn  Allergies: No Known Drug Allergies  Review of Systems       see HPI  Physical Exam  General:  Obese,in no acute distress; alert,appropriate and cooperative throughout examination Eyes:  No corneal or conjunctival inflammation noted.+ pale conjunctiva Mouth:  Oral mucosa and oropharynx without lesions or exudates.  Teeth in good repair.  Tonsils large with pits, but not occluding airway.  No erythema.   Lungs:  Normal respiratory effort, chest expands symmetrically. Lungs are clear to auscultation, no crackles or wheezes. Heart:  Normal rate and regular rhythm. S1 and S2 normal without gallop, murmur, click, rub or other extra sounds. Abdomen:  Bowel sounds positive,abdomen soft  without masses, organomegaly or hernias noted.  +TTP epigastrum   Impression & Recommendations:  Problem # 1:  ABDOMINAL PAIN (ICD-789.00) Pt with epigastric pain.  Unclear etiology, but h/o binge/purge concerning.  No hematemesis.  Check H.pylori, abd ultrasound to r/o gallbladder pathology.  If no  source identified, consider GI referral to consider EGD. Orders: Comp Met-[facility] [id number]) H pylori-[facility] [id number]) TSH-[facility] [id number]) [facility]- Est Level  3 (40102)  Problem # 2:  SUBACROMIAL BURSITIS, RIGHT (ICD-726.19) Discussed that MRI will not help [place], that I would rather have her see PT first or SM or ortho if she feels she needs a second opinion.  She will consider.  Problem # 3:  OBESITY, UNSPECIFIED (ICD-278.00) Pt interested in healthy weight loss.  Refer to Dr. Gerilyn Pilgrim.  Orders: TSH-[facility] ([id number]) [facility]- Est Level  3 (47425)  Problem # 4:  EMESIS (ICD-787.03)  Pt with self-induced  vomiting after binging.  Interested in therapy for this.  Will look into resources in the area and call pt with some names.  No concerns of SI/HI.  Pt trying to lose weight through this.  Check electrolytes today.  Follow up 1-2 weeks  Orders: [facility]- Est Level  3 ([id number])  Problem # 5:  PALLOR (ICD-782.61) NOted on exam.  Check CBC Orders: CBC-[facility] (75643) [facility]- Est Level  3 (32951)  Problem # 6:  GRIEF REACTION, ACUTE (ICD-309.0)  Doing slightly better, but still with symptoms.  No SI/HI, contracts for safety. Wants counseling, but would like to speak with an older woman and to not change therapists frequently.  Gave options of family services and of [employer], will look into more options for her.  Follow up 1-2 weeks.  Paper work completed last week for leave from work.  Orders: [facility]- Est Level  3 ([id number])  Complete Medication List: 1)  Fluticasone Propionate 50 Mcg/act Susp (Fluticasone propionate) .... 2 sprays each nostril once daily 2)  Zyrtec Allergy 10 Mg Tabs (Cetirizine hcl) .Marland Kitchen.. 1 once daily prn  Other Orders: Ultrasound (Ultrasound)  Patient Instructions: 1)  We will check some labs today. 2)  I will look into some resources for you and call you with some names. 3)  Please schedule an appt with Dr. Wyona Almas to talk about nutrition.  4)  Please schedule an appt with me in 1-2 weeks. 5)  You can try the Neti pot for your sinuses. 6)  Let us know if you would like a referral to Sports Medicine. 7)  We will set up an ultrasound for your gall bladder  Laboratory Results   Blood Tests   Date/Time Received: July 17, 2009 10:50 AM  Date/Time Reported: July 17, 2009 11:21 AM    H. pylori: negative Comments: ...............test performed by......Marland KitchenBonnie A. [place], MLS (ASCP)cm

## 2010-04-24 NOTE — Assessment & Plan Note (Signed)
Summary: to see Sara [name],df  Nurse Visit   Vital Signs:  Patient profile:   35 year old female Height:      66.5 inches Weight:      277 pounds BMI:     44.20 Temp:     98.6 degrees F Pulse rate:   86 / minute BP sitting:   127 / 84  (left arm)  Vitals Entered By: Theresia Lo RN (August 30, 2009 9:34 AM)  Physical Exam  General:  Obese, no distress.  Vitals noted.  2 lb weight gain since last visit Skin:  Hyperpigmented rash flexor surface L arm, back of neck.  Dry with minimal scale.   Psych:  Nl grooming and dress.  Flat affect.  Non labile. ?responding to internal stimuli x 1 during visit .  No FOI or LOA. Still somewhat guarded, but less so than than previous visit.  TC: concerns about meds, wants something for weight loss.     Impression & Recommendations:  Problem # 1:  DEPRESSION, SEVERE (ICD-311)  Still with psychotic features.  Have discussed with therapist, who has also encouraged meds.  Pt has psychiatrist appt pending.  Now taking meds, but no improvement yet.  Unwilling to consider hospitalization at this point, says she would like to give meds a chance to work first.  Per pt, husband also not wanting  hospitalization - wants her to lean on him instead.  Pt able to contract for safety; no indications for involuntary committment at this point.  Pt unable to work at this time. I am concerned that pt and daughter identify her (daughter) as main person who helps mother.  This is difficult role for 54 yo.  Daughter and patient and I discussed that if daughter is worried about her mom, she can always call the clnic and let us know. Pt echoed this idea to her daughter.  Follow up 1 week.  Orders: [facility]- Est Level  3 ([address])  Problem # 2:  BULIMIA NERVOSA (ICD-307.51) Pt still very concerned about weight, but we can't really address this until psychosis is under control.  Will check B12 level today. Orders: B12-[facility] ([id number])  Problem # 3:  SUBACROMIAL BURSITIS, RIGHT  (ICD-726.19)  Pt has ortho appt pending.  Will find out name of cream her sister used and call us.   Orders: [facility]- Est Level  3 ([id number])  Problem # 4:  ECZEMA, ATOPIC (ICD-691.8)  Will try triamcinolone.  Pt advised on its use. Orders: [facility]- Est Level  3 ([id number])  Her updated medication list for this problem includes:    Triamcinolone Acetonide 0.1 % Oint (Triamcinolone acetonide) .Marland Kitchen... Apply two times a day to affected area.  Complete Medication List: 1)  Fluticasone Propionate 50 Mcg/act Susp (Fluticasone propionate) .... 2 sprays each nostril once daily 2)  Zyrtec Allergy 10 Mg Tabs (Cetirizine hcl) .Marland Kitchen.. 1 once daily prn 3)  Abilify 15 Mg Tabs (Aripiprazole) .... Take 1 by mouth daily 4)  Triamcinolone Acetonide 0.1 % Oint (Triamcinolone acetonide) .... Apply two times a day to affected area.   Patient Instructions: 1)  Please come back and see me next week. 2)  It was great to see you today.  Please let us know if you feel worse in the meantime. 3)  I will get a letter ready for disability and wait for the name and fax number of the person to send it to. 4)  Let us know the name of the medicine (cream) that you  would like to try for your shoulder.   Chief Complaint:  follow up.  History of Present Illness: 35 yo with psychosis here with her 10 yo daughter for f/u. Main complaint today is feeling tired. Reports she has good days and bad days.  On bad days, she sees things (people, unable to see faces clearly) a few times a day. On good days, no hallucinations.  Feels really tired today because having trouble sleeping due to "things running through her mind"  This gets so bad that she doesn't want to sleep, instead watches TV at night.  Pt does not want to describe specifics of things going through her mind or what she sees at night.  Denies command hallucinations.  Denies SI/HI.  Does feel sad. Did start meds, but reports a struggle with herself about taking them.  States husband  was very supportive of her taking the meds, and she is taking one daily.  No improvement yet, but only started 2 days ago.  Does feel hopeful that meds will make things better.   Support: Husband does seem concerned about her and calls several times a day.  He has taken away her credit card to help prevent spending sprees. Pt states they can talk about things and identifies him as supportive.   Pt here with 25 yo daughter today, and is open about talking in front of her.  Daughter states that she thinks her mom is "doing really well", and that they laugh together and have a good time. Daughter now home from school for the year.    Stressors: still having trouble getting her disability payments from work.  Father is not feeling well, and pt is trying to help him.  Youngest son needs rides to and from school.    Other issues: pt had to reschedule her ortho appt because of her son's end of the year party.  Now has appt for 6/17, and interested in trying a cream that her sister used for pain.  Unable to recall name of cream. Developed itchy rash on her arm and neck.  Treating with rubbing alcohol with relief.  No h/o eczema, but daughter has it.   Concerned about weight.  "I don't understand why you all don't have something that can make you lose weight."  Reports several people she knows lweight wtih b12 shots.  Would like to try those.  Unsure if these people used B12 in addition to exercise and nutrition program.     CC: follow up Is Patient Diabetic? No Pain Assessment Patient in pain? no        Habits & Providers  Alcohol-Tobacco-Diet     Tobacco Status: never  Allergies: No Known Drug Allergies  Orders Added: 1)  B12-[facility] [[id number]] 2)  [facility]- Est Level  3 [[address]] Prescriptions: TRIAMCINOLONE ACETONIDE 0.1 % OINT (TRIAMCINOLONE ACETONIDE) Apply two times a day to affected area.  #30 gm x 0   Entered and Authorized by:   Sarah [place] MD   Signed by:   Sarah [place] MD on  08/30/2009   Method used:   [facility]  [address]       [address]       [place], [place]  [address]       Ph: [phone] or [phone]       Fax: [phone]   RxID:   [id number]    Vital Signs:  Patient profile:   34  year old female Height:      66.5 inches Weight:      277 pounds BMI:     44.20 Temp:     98.6 degrees F Pulse rate:   86 / minute BP sitting:   127 / 84  (left arm)  Vitals Entered By: Theresia Lo RN (August 30, 2009 9:34 AM)

## 2010-04-24 NOTE — Miscellaneous (Signed)
Summary: [facility]  [employer]   Imported By: Clydell Hakim 07/17/2009 12:07:49  _____________________________________________________________________  External Attachment:    Type:   Image     Comment:   External Document

## 2010-06-30 LAB — COMPREHENSIVE METABOLIC PANEL
ALT: 15 U/L (ref 0–35)
AST: 22 U/L (ref 0–37)
Albumin: 3.7 g/dL (ref 3.5–5.2)
CO2: 28 mEq/L (ref 19–32)
Calcium: 9.3 mg/dL (ref 8.4–10.5)
Chloride: 103 mEq/L (ref 96–112)
GFR calc non Af Amer: >60 mL/min (ref >60)
Sodium: 138 mEq/L (ref 135–145)
Total Bilirubin: 0.7 mg/dL (ref 0.3–1.2)

## 2010-06-30 LAB — DIFFERENTIAL
Basophils Absolute: 0 10*3/uL (ref 0.0–0.1)
Eosinophils Relative: 1 % (ref 0–5)
Lymphocytes Relative: 46 % (ref 12–46)
Monocytes Absolute: 0.3 10*3/uL (ref 0.1–1.0)
Monocytes Relative: 5 % (ref 3–12)

## 2010-06-30 LAB — POCT PREGNANCY, URINE

## 2010-06-30 LAB — URINALYSIS, ROUTINE W REFLEX MICROSCOPIC
Nitrite: NEGATIVE
Specific Gravity, Urine: 1.024 (ref 1.005–1.030)
Urobilinogen, UA: 0.2 mg/dL (ref 0.0–1.0)

## 2010-06-30 LAB — CBC
Platelets: 282 10*3/uL (ref 150–400)
WBC: 6 10*3/uL (ref 4.0–10.5)

## 2010-08-10 NOTE — Op Note (Signed)
Sara [name], Sara [name]                      ACCOUNT NO.:  [id number]   MEDICAL RECORD NO.:  [id number]                   PATIENT TYPE:  INP   LOCATION:  9127                                 FACILITY:  [facility]   PHYSICIAN:  Conni Elliot, M.D.             DATE OF BIRTH:  1975-07-27   DATE OF PROCEDURE:  09/16/2003  DATE OF DISCHARGE:                                 OPERATIVE REPORT   PREOPERATIVE DIAGNOSES:  Desires repeat cesarean delivery and surgical  sterilization.   POSTOPERATIVE DIAGNOSES:  Desires repeat cesarean delivery and surgical  sterilization.   OPERATION:  Repeat low transverse cesarean delivery and modified bilateral  Pomeroy  tubal ligation.   SURGEON:  Conni Elliot, M.D.   ANESTHESIA:  Continue lumbar epidural.   FINDINGS:  Female infant with Apgar of 9 & 9, placenta was sent to pathology.   DESCRIPTION OF PROCEDURE:  After placing the patient under continuous lumbar  epidural anesthetic with the patient supine in left lateral tilt position  receiving oxygen, the abdomen was prepped and draped in sterile fashion. [name]  low transverse Pfannenstiel incision was made, incision made through the  skin and fascia, rectus muscle to the midline. Bladder flap created. [name] low  transverse uterine incision was made.  The infant was delivered in vertex  presentation, cord doubly clamped and cut. Baby handed to neonatology in  attendance.  The placenta was delivered spontaneously. The uterus and  bladder flap were closed in routine fashion.  We then turned to the  sterilization. The right fallopian tube was identified, grasped with Babcock  clamps, followed to the fimbriated ends.  [name] segment of tube was brought onto  the operative field, doubly suture ligated _________  segment was incised.  Hemostasis was adequate. [name] similar procedure done on the opposite side,  hemostasis again adequate.  The anterior peritoneum, fascia, and  subcutaneous skin were closed in an adequate  fashion. Estimated blood loss  less than 800 mL.                                               Conni Elliot, M.D.    ASG/MEDQ  D:  09/16/2003  T:  09/17/2003  Job:  [id number]

## 2010-08-10 NOTE — Discharge Summary (Signed)
Sara [name], [name]                      ACCOUNT NO.:  [id number]   MEDICAL RECORD NO.:  [id number]                   PATIENT TYPE:  INP   LOCATION:  9127                                 FACILITY:  [facility]   PHYSICIAN:  Tanya S. Shawnie Pons, M.D.                DATE OF BIRTH:  1975/11/09   DATE OF ADMISSION:  09/12/2003  DATE OF DISCHARGE:  09/19/2003                                 DISCHARGE SUMMARY   DISCHARGE DIAGNOSES:  1. Repeat low transverse cesarean section with delivery of a viable female,     term.  2. Pregnancy-induced hypertension.  3. Failed trial of labor for vaginal birth after cesarean section.  4. Urinary retention, resolved.  5. Status post bilateral tubal ligation.  6. Probable gestational diabetes.   DISCHARGE MEDICATIONS:  1. Percocet 5/325, 1-2 tablets p.o. q.3-4h. p.r.n. pain.  2. Ibuprofen 600 mg p.o. q.6h. p.r.n. pain.  3. Prenatal vitamins one p.o. daily.  4. Iron 325 mg p.o. daily.   CONSULTS:  None.   PROCEDURES:  1. Low transverse cesarean section as described in OP note.  2. Bilateral tubal ligation as described in OP note.   LABORATORY DATA:  PIH labs revealed WBC of 7.5, hemoglobin 10.1, platelets  232, uric acid 5.3, HDL 130, AST 20.   DISCHARGE INSTRUCTIONS:  1. No heavy lifting.  2. Nothing per vagina x6 weeks.  3. Gradually increase exercise as tolerated.  4. Diabetic diet.  Low carbohydrate diet until we can be sure that blood     sugars do not remain elevated.   FOLLOW UP:  1. In 6 weeks at [facility] with Dr. Linford Arnold.  2. Follow up for staple removal and wound check in the maternal admission     unit in 203 days.   HOSPITAL COURSE:  The patient is a 35 year old female, G3, P3, who was  admitted to [facility] on September 12, 2003 with possible PIH with  headache and mildly elevated blood pressure history.  The patient was at 54  and 3/7ths weeks gestational age intrauterine pregnancy on the day of  admission,  requesting trial of labor for VBAC.  Pitocin was initiated on  September 13, 2003.  The patient's cervix changed from 2 cm dilated to 3 cm  dilated over the next 5 hours.  For the following 24 hours, the patient's  cervix remained unchanged, even though Pitocin was adequate.  Her Pitocin  was causing what appeared to be adequate contractions, although IUPC was not  placed because the patient still had intact membranes.  PIH labs were  checked during her hospital stay, and remained within normal limits.  The  patient's symptoms of headache improved with Tylenol.  Because of the lack  of cervical change, the risks and benefits of further attempts at aggressive  induction with a continually unfavorable cervix not responding to Pitocin  was discussed.  The patient agreed to stop  attempts at induction of labor,  but because of her concern of question of early PIH, the patient was  transferred to mother-baby for blood pressure monitoring.  Throughout this  time, the patient's fetal heart tones remained reassuring.  The patient was  watched on mother and baby until September 15, 2003, when Dr. Penne Lash spoke with  the patient regarding further delivery plans.  At that time, the patient was  having second thoughts about a VBAC.  She had felt that the infant was going  to be large, given that it feels larger than the 4000 gm infant that she  delivered previously.  The patient's blood pressure remained within normal  limits. Cervical exam was still unchanged.  The patient was offered Pitocin  induction, but refused this.  The patient was then scheduled for a repeat  cesarean section at 9 a.m. on September 16, 2003.  Repeat low transverse cesarean  section was done and bilateral tubal ligation was done without  complications.  For the next 3 days postoperatively, the patient did well.  The only issue that arose was that she had some difficulty voiding for about  5 hours after surgery.  In and out catheterization was  performed which  released 1000 cc of urine.  Following that, the patient began to  spontaneously urinate without problems on her own.  No further in and out  caths were needed.  The patient remained afebrile, pain was well controlled  with Percocet.  The patient's hemoglobin was 9.4, and she was placed on iron  once a day.  She breast and bottle fed, primarily breast, with some  assistance from lactation consults.  The patient's infant boy received a  circumcision.  On the day of discharge, the patient was doing well, and will  return for follow up as mentioned previously.     Sara Nora, MD                           Sara [name] Shawnie Pons, M.D.    AB/MEDQ  D:  09/19/2003  T:  09/19/2003  Job:  [id number]

## 2010-08-26 ENCOUNTER — Inpatient Hospital Stay (INDEPENDENT_AMBULATORY_CARE_PROVIDER_SITE_OTHER)
Admission: RE | Admit: 2010-08-26 | Discharge: 2010-08-26 | Disposition: A | Discharge status: Home / Self Care | Payer: Self-pay | Source: Ambulatory Visit | Attending: Emergency Medicine | Admitting: Emergency Medicine

## 2010-08-26 DIAGNOSIS — J029 Acute pharyngitis, unspecified: Secondary | ICD-10-CM

## 2010-08-26 DIAGNOSIS — J069 Acute upper respiratory infection, unspecified: Secondary | ICD-10-CM

## 2011-09-10 ENCOUNTER — Ambulatory Visit (INDEPENDENT_AMBULATORY_CARE_PROVIDER_SITE_OTHER): Payer: BC Managed Care – PPO | Admitting: Sports Medicine

## 2011-09-10 VITALS — BP 136/91

## 2011-09-10 DIAGNOSIS — M722 Plantar fascial fibromatosis: Secondary | ICD-10-CM

## 2011-09-10 HISTORY — DX: Plantar fascial fibromatosis: M72.2

## 2011-09-10 MED ORDER — MELOXICAM 15 MG PO TABS: ORAL_TABLET | ORAL | Status: AC

## 2011-09-10 NOTE — Patient Instructions (Addendum)
Dr. Karie Schwalbe gave you an injection for plantar fasciitis today  meloxicam  Please do suggested home exercises  Please follow up in 6 weeks  Thank you for seeing [place] today!

## 2011-09-10 NOTE — Progress Notes (Signed)
Patient ID: Ok Anis, female   DOB: 1975-05-11, 36 y.o.   MRN: [id number]  Subjective:   CC: left heel pain.  HPI: this patient is a very pleasant 16-year-old female who is the daughter of Lanae Boast at the family practice center.  She comes in with a 3 week history of intense pain that she localizes at the plantar aspect of her left heel. It is particularly bad in the first few steps in the morning, and feels like a knife is stabbing. She cut a football and half, and is using this as a heel cup. She has not tried any anti-inflammatories, and has not had any rehabilitation yet.  Past medical history:mood disorder, obesity, shoulder pain. Surgical history:none. Family history:positive for diabetes, and hypertension. Social history:works as a Psychologist, occupational, denies use of alcohol, tobacco, or drugs. Allergies, and medications reviewed from the medical record and no changes needed.  Review of Systems: No fevers, chills, night sweats, weight loss, chest pain, or shortness of breath.    Objective:  General:  Well Developed, well nourished, and in no acute distress. Neuro:  Alert and oriented x3, extra-ocular muscles intact. Skin: Warm and dry, no rashes noted. Respiratory:  Not using accessory muscles, speaking in full sentences. Musculoskeletal:tender to palpation at the calcaneal insertion of the plantar fascia. Pes planus bilaterally. Good tibialis posterior function.  Real-time Ultrasound Guided Injection of: left plantar fascia origin. Ultrasound guided injection is preferred based studies that show increased duration, increased effect, greater accuracy, decreased procedural pain, increased response rate, and decreased cost with ultrasound guided versus blind injection. Verbal informed consent obtained. Time-out conducted. Noted no overlying erythema, induration, or other signs of local infection. Skin prepped in a sterile fashion. Local anesthesia: Topical Ethyl chloride. With  sterile technique and under real time ultrasound guidance: needle advanced just deep to origin of the plantar fascia into calcaneus. 1 cc Depo-Medrol 40, 1 cc lidocaine injected. Completed without difficulty Pain immediately resolved suggesting accurate placement of the medication. Advised to call if fevers/chills, erythema, induration, drainage, or persistent bleeding. Images saved.   Assessment & Plan:

## 2011-09-10 NOTE — Assessment & Plan Note (Addendum)
Guilded injection as above. HEP. SI with heel cups. Meloxicam. RTC 3 weeks. Can make custom orthotics if no better at that point.

## 2013-07-15 ENCOUNTER — Encounter: Payer: Self-pay | Admitting: Family Medicine

## 2013-07-15 ENCOUNTER — Ambulatory Visit (INDEPENDENT_AMBULATORY_CARE_PROVIDER_SITE_OTHER): Payer: BC Managed Care – PPO | Admitting: Family Medicine

## 2013-07-15 VITALS — BP 134/83 | HR 93 | Temp 98.4°F | Ht 67.0 in | Wt 338.0 lb

## 2013-07-15 DIAGNOSIS — F4321 Adjustment disorder with depressed mood: Secondary | ICD-10-CM

## 2013-07-15 MED ORDER — SERTRALINE HCL 25 MG PO TABS: 25.0000 mg | ORAL_TABLET | Freq: Every day | ORAL | Status: DC

## 2013-07-15 MED ORDER — LORAZEPAM 1 MG PO TABS: 1.0000 mg | ORAL_TABLET | Freq: Two times a day (BID) | ORAL | Status: DC | PRN

## 2013-07-15 NOTE — Patient Instructions (Signed)
Start taking the Zoloft daily. Take the Ativan as needed at nighttime. You can start with 1/2 tablet.  I will see you back in 1 week. Call if you need anything before then.  Milia Warth M. Janda Cargo, M.D.

## 2013-07-15 NOTE — Progress Notes (Signed)
Patient ID: Sara [name], female   DOB: [birth year], 38 y.o.   MRN: [id number]    Subjective: HPI: Patient is a 38 y.o. female presenting to clinic today for new patient appointment. Previously seen here, but has not been here in a long time so needs to be re-established. Concerns today include grief.   Grief reaction- Her grandmother recently passed away. She has a difficult time since then. Has moments of anxiety/panic related to SOB and tightness in chest, especially at bedtime. She is having a difficult time concentrating, especially at work. She is constantly tearful. She has not seen a Veterinary surgeoncounselor. Considering Hospice resources, however she does not like the thought of going there. (Her aunt has volunteered to go with her.) Her family is very supportive of her Patient had a severe grief reaction in the past requiring Abilify due to hallucinations. At present, no SI/HI. No AH/VH.  Past Medical History  Diagnosis Date  . Allergy   . Depression    Past Surgical History  Procedure Laterality Date  . Tubal ligation    . Cesarean section      2005   Health Maintenance:  Pap smear- >5 years, no history of abnormal Last labs- >5 years Flu shot- Did receive TDap- Within last 10 years  ROS: Please see HPI above.  Objective: Office vital signs reviewed. BP 134/83  Pulse 93  Temp(Src) 98.4 F (36.9 C) (Oral)  Ht 5\' 7"  (1.702 m)  Wt 338 lb (153.316 kg)  BMI 52.93 kg/m2  LMP 07/14/2013  Physical Examination:  General: Awake, alert.  HEENT: Atraumatic, normocephalic. MMM. Posterior pharynx erythema Neck: No masses palpated. No LAD Pulm: CTAB, no wheezes.  Cardio: RRR, no murmurs appreciated Abdomen:+BS, soft, nontender, nondistended Extremities: Trace edema Skin: Diffusely dry. Upper extremities covered with hyperpigmented macules with mild excoriation Neuro: Strength and sensation grossly intact Psych: Good eye contact. Hygiene normal. Mood depressed, tearful. Affect normal.  Answers questions appropriately  Assessment: 38 y.o. female new patient with grief reaction  Plan: See Problem List and After Visit Summary

## 2013-07-15 NOTE — Assessment & Plan Note (Signed)
A: Grief reaction after death of grandmother, now with depression and anxiety related to loss. Unable to work due to decreased concentration and tearfulness. Unable to sleep due to anxiety. Has not seen counselor.  P: - Zoloft 25mg  daily, plan to increase to 50mg  in 1 month if helping - Ativan 0.5-1mg  po qhs for sleep and anxiety. Patient aware that this will likely not be a long term medication, but hopefully it will help her for now. - Encouraged to seek grief counseling either through Hospice or another counselor in town - Given work note to return when cleared by physician. I will complete FMLA paperwork if needed - F/u in 1 week with PCP or myself to see how she is doing. Call if need to be seen sooner.

## 2013-07-22 ENCOUNTER — Ambulatory Visit (INDEPENDENT_AMBULATORY_CARE_PROVIDER_SITE_OTHER): Payer: BC Managed Care – PPO | Admitting: Family Medicine

## 2013-07-22 ENCOUNTER — Encounter: Payer: Self-pay | Admitting: Family Medicine

## 2013-07-22 VITALS — BP 132/84 | HR 96 | Temp 98.6°F | Ht 67.0 in | Wt 336.0 lb

## 2013-07-22 DIAGNOSIS — F4321 Adjustment disorder with depressed mood: Secondary | ICD-10-CM

## 2013-07-22 NOTE — Patient Instructions (Signed)
It was good to see you.  - Start taking the Zoloft 25mg  at night time - Use the Lorazepam ONLY if you need it, and try 1/2 pill  For your hand, use Aleve twice daily and get an over the counter hand brace.  I will see you back next week. If you think you can go back to work after you speak with Ms. Lucas MallowGrove next week just call and let me know.  Thanks, Continental Airlinesmber M. Hairford, M.D.

## 2013-07-25 NOTE — Progress Notes (Signed)
Patient ID: Ok Anisaaimah A Mcclees, female   DOB: 09/20/1975, 38 y.o.   MRN: [id number]    Subjective: HPI: Patient is a 38 y.o. female presenting to clinic today for follow up on depression.  Seen last week for grief reaction. Started on Zoloft daily with Ativan qhs prn anxiety and sleep. States she feels no different. She feels more tired and out of it on the medications. She has not seen a grief counselor yet, however, Hospice counselor coming to her house next week. She states she is concerned that she is the only one who has not been able to deal with the death of her grandmother. No SI/HI. No VH/AH. PHQ 9 score 16.   History Reviewed: Non smoker.  ROS: Please see HPI above.  Objective: Office vital signs reviewed. BP 132/84  Pulse 96  Temp(Src) 98.6 F (37 C) (Oral)  Ht 5\' 7"  (1.702 m)  Wt 336 lb (152.409 kg)  BMI 52.61 kg/m2  LMP 07/14/2013  Physical Examination:  General: Awake, alert. NAD, not crying HEENT: Atraumatic, normocephalic MMM Neck: No masses palpated. No LAD Pulm: CTAB, no wheezes Cardio: RRR, no murmurs appreciated Abdomen: Obese, soft, nontender, nondistended Extremities: No edema Neuro: Grossly intact Psych: Good eye contact, nontearful. Answers questions appropriately. Not distracted.  Assessment: 38 y.o. female follow up depression  Plan: See Problem List and After Visit Summary

## 2013-07-25 NOTE — Assessment & Plan Note (Signed)
A: Continues to have a hard time with grief. Not tolerating medications well, I suspect Ativan is causing her to feel more tired. No safety concerns.  P: - Con't Zoloft - D/c Ativan unless really needed for anxiety attacks - Meet with counselor next week - F/u here in one week also

## 2013-07-28 ENCOUNTER — Encounter: Payer: Self-pay | Admitting: Family Medicine

## 2013-07-28 ENCOUNTER — Ambulatory Visit (INDEPENDENT_AMBULATORY_CARE_PROVIDER_SITE_OTHER): Payer: BC Managed Care – PPO | Admitting: Family Medicine

## 2013-07-28 VITALS — HR 104 | Ht 67.0 in | Wt 333.9 lb

## 2013-07-28 DIAGNOSIS — F3289 Other specified depressive episodes: Secondary | ICD-10-CM

## 2013-07-28 DIAGNOSIS — F4321 Adjustment disorder with depressed mood: Secondary | ICD-10-CM

## 2013-07-28 DIAGNOSIS — F329 Major depressive disorder, single episode, unspecified: Secondary | ICD-10-CM

## 2013-07-28 MED ORDER — PROPRANOLOL HCL 20 MG PO TABS: 20.0000 mg | ORAL_TABLET | Freq: Three times a day (TID) | ORAL | Status: DC

## 2013-07-30 NOTE — Progress Notes (Signed)
   Subjective:    Patient ID: Sara [name], female    DOB: September 16, 1975, 38 y.o.   MRN: [id number]  HPI  Followup depression anxiety 30 with grief reaction. She has not been taking the Zoloft regularly. She stopped taking the benzodiazepine because it made her feel too drugs. She continues to have problems with sleep. Denies hallucination. No suicidal or homicidal ideation intent or plan. She continues to have some symptoms of or phobia. Continues to have daily frequent crying spells. Appetite is fine.  Review of Systems See history of present illness. Additionally no fever, sweats, chills, unusual weight change.    Objective:   Physical Exam Vital signs reviewed. GENERAL: Well-developed, well-nourished, no acute distress. CARDIOVASCULAR: Regular rate and rhythm no murmur gallop or rub LUNGS: Clear to auscultation bilaterally, no rales or wheeze. ABDOMEN: Soft positive bowel sounds NEURO: No gross focal neurological deficits. MSK: Movement of extremity x 4.  PSYCHIATRIC: Alert and oriented x4. Affect is interactive. Tearful at times. Speech is normal in content and fluency. 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 Long discussion any greater than 50% of her 40 minute office in counseling education regarding these issues. She agrees to take the Zoloft daily. I'll give her some Inderal for anxiety relief. He may also help her blood pressure which is suboptimally controlled. She'll discontinue the benzodiazepine. I would recommend she continue the counseling. See her back in 2-3 weeks, sooner if problems.

## 2013-08-11 ENCOUNTER — Ambulatory Visit (INDEPENDENT_AMBULATORY_CARE_PROVIDER_SITE_OTHER): Payer: BC Managed Care – PPO | Admitting: Family Medicine

## 2013-08-11 ENCOUNTER — Encounter: Payer: Self-pay | Admitting: Family Medicine

## 2013-08-11 VITALS — BP 144/98 | HR 88

## 2013-08-11 DIAGNOSIS — L281 Prurigo nodularis: Secondary | ICD-10-CM

## 2013-08-11 DIAGNOSIS — F4321 Adjustment disorder with depressed mood: Secondary | ICD-10-CM

## 2013-08-11 DIAGNOSIS — L280 Lichen simplex chronicus: Secondary | ICD-10-CM

## 2013-08-11 HISTORY — DX: Prurigo nodularis: L28.1

## 2013-08-11 MED ORDER — CAPSAICIN 0.15 % EX LIQD: CUTANEOUS | Status: DC

## 2013-08-11 NOTE — Assessment & Plan Note (Signed)
Long discussion with her about absolute need to at least try the end of present medicine. She agrees and we'll try and I'll see her back one week.

## 2013-08-11 NOTE — Progress Notes (Signed)
Patient ID: Sara [name], female   DOB: [birth year], 38 y.o.   MRN: [id number]  Sara [name] - 38 y.o. female MRN [id number]  Date of birth: [birth year]    SUBJECTIVE:     Followup grief reaction. She still did not start the end of present therapy. She continues to have crying episodes but she's feeling a little bit better about them. No suicidal or homicidal ideation. She still pre-much staying at home most of the time.  Second issue is she now has a rash on her arms that is quite itchy. She's been scratching and it is getting worse. ROS:     No fever, sweats, chills.  PERTINENT  PMH / PSH FH / / SH:  Past Medical, Surgical, Social, and Family History Reviewed & Updated per EMR.  Pertinent Historical Findings include:  Grief reaction  OBJECTIVE: BP 144/98  Pulse 88  LMP 07/14/2013  Physical Exam:  Vital signs are reviewed. GENERAL: Well-developed overweight female no acute distress PSYCHIATRIC: Alert and oriented x4. Tearful. Good eye contact. Neatly dressed. Asks and answers questions appropriately. Speech is normal in content and fluency. Skin: Hyperpigmented areas and some excoriated areas on bilateral arms.  ASSESSMENT & PLAN: See problem based charting & AVS for pt instructions.

## 2013-08-11 NOTE — Patient Instructions (Signed)
START your antidepressant medicine!

## 2013-08-18 ENCOUNTER — Encounter: Payer: Self-pay | Admitting: Family Medicine

## 2013-08-18 ENCOUNTER — Telehealth: Payer: Self-pay | Admitting: Family Medicine

## 2013-08-18 ENCOUNTER — Ambulatory Visit (INDEPENDENT_AMBULATORY_CARE_PROVIDER_SITE_OTHER): Payer: BC Managed Care – PPO | Admitting: Family Medicine

## 2013-08-18 VITALS — BP 139/78 | HR 99 | Ht 67.0 in | Wt 338.8 lb

## 2013-08-18 DIAGNOSIS — F4321 Adjustment disorder with depressed mood: Secondary | ICD-10-CM

## 2013-08-18 NOTE — Telephone Encounter (Signed)
Dear Cliffton Asters Team pleqse give her these three names of therpaists---I have left some info up front if she wants it  Loyce Dys [phone] Sh[place] [phone] ext [phone] Drusilla Kanner [phone] She can also look up  On internet if she wants to see more about these [facility]! Nestor Ramp

## 2013-08-18 NOTE — Telephone Encounter (Signed)
Spoke with patient and per her I gave the envelope to [place] to give to her

## 2013-08-20 NOTE — Progress Notes (Signed)
   Subjective:    Patient ID: Ok Anis, female    DOB: [birth year], 38 y.o.   MRN: [id number]  HPI Followup complicated grief, depression. Brings with her a letter she wrote me saying that she's having difficulty telling me everything she needs to when she's here. She has difficulty focusing and she is ashamed by the fact that she's not been able to start her medication. Says she does not want to become dependent on medicine like her onto is bipolar. Worried that the medications make her less of a person. Feels like she should be strong enough to handle all these issues. Continues to be tearful, largely a core phobic only venturing out occasionally, not very her active with her family. Overeating. Sleeping excessively. Most day she does complete her ADLs.   Review of Systems See history of present illness    Objective:   Physical Exam Vital signs reviewed. GENERAL: Well-developed, well-nourished, obese no acute distress. CARDIOVASCULAR: Regular rate and rhythm no murmur gallop or rub LUNGS: Clear to auscultation bilaterally, no rales or wheeze. ABDOMEN: Soft positive bowel sounds NEURO: No gross focal neurological deficits. Alert oriented x4. MSK: Movement of extremity x 4. Psychiatric: Tearful. Moderately good eye contact. Neatly dressed. Mildly agitated, jiggling her leg the entire office visit. Denies hallucination both on the in visual. Asks and answers questions appropriately. Follows commands. Remote and recent memory intact.         Assessment & Plan:

## 2013-08-20 NOTE — Assessment & Plan Note (Signed)
Complicated grief reaction versus underlying depressive disorder with acute grief reaction on top. Spent greater than 50% of our 45 minute office visit in counseling education. We discussed medication. It's unclear whether she will start this or not. I reviewed the letter she wrote me and I placed a copy in the chart. Recommended 3 therapist for her to call and get set up. I'll see her in 2 weeks.

## 2013-09-01 ENCOUNTER — Ambulatory Visit (INDEPENDENT_AMBULATORY_CARE_PROVIDER_SITE_OTHER): Payer: BC Managed Care – PPO | Admitting: Family Medicine

## 2013-09-01 ENCOUNTER — Encounter: Payer: Self-pay | Admitting: Family Medicine

## 2013-09-01 VITALS — BP 123/76 | HR 85 | Temp 99.0°F | Ht 67.0 in | Wt 332.0 lb

## 2013-09-01 DIAGNOSIS — F4321 Adjustment disorder with depressed mood: Secondary | ICD-10-CM

## 2013-09-02 NOTE — Assessment & Plan Note (Signed)
Start have some fairly significant improvement. Think she's closely getting ready to go back to work I would recommend that. Intermittent occasional appropriate use of anti-anxiety medicine. She still has not started the antidepressant medication I don't think she probably would do that. She is interested in doing cognitive behavioral therapy but at this time cannot afford. I'll see her back one month, sooner if problems or

## 2013-09-02 NOTE — Progress Notes (Signed)
   Subjective:    Patient ID: Sara [name], female    DOB: 21-Oct-1975, 38 y.o.   MRN: [id number]  HPI  Patient here for followup of acute complicated grief reaction in the setting of history of depression. She's improved a little bit in the last week or so. She started think about getting back into the swing of life. She still had occasional crying episodes. Denies suicidal or homicidal ideation. Her long time 20 her friend from her childhood just died secondary complications of AIDS. This is made issues a little bit more complex she seems to be improving overall. Started think about going back to work. She does see a she did not take the antidepressants. 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  Review of Systems see history of present illness    Objective:   Physical Exam Vital signs are reviewed GENERAL: Well-developed obese female no acute distress Psychiatric: Alert and oriented x4. Affect is much more stable and less labile today. She asks and answers questions appropriately. Denies hallucination. No psychomotor retardation, no agitation       Assessment & Plan:

## 2013-09-29 ENCOUNTER — Encounter: Payer: Self-pay | Admitting: Family Medicine

## 2013-09-29 ENCOUNTER — Ambulatory Visit (INDEPENDENT_AMBULATORY_CARE_PROVIDER_SITE_OTHER): Payer: BC Managed Care – PPO | Admitting: Family Medicine

## 2013-09-29 VITALS — BP 128/82 | HR 93 | Temp 98.2°F | Ht 67.0 in | Wt 333.9 lb

## 2013-09-29 DIAGNOSIS — F4321 Adjustment disorder with depressed mood: Secondary | ICD-10-CM

## 2013-10-01 NOTE — Assessment & Plan Note (Signed)
Doing much better with [name]. grief reaction. She plans to go back to work next week.

## 2013-10-01 NOTE — Progress Notes (Signed)
   Subjective:    Patient ID: Sara [name], female    DOB: [birth year], 38 y.o.   MRN: [id number]  HPI Grooving in her depressive, anxious symptoms. Feels like she is finally starting to get through the grief process from the death of her paternal grandmother. She has been more active, involved in the family reading and. Feels much more hopeful and like herself. Sleeping okay. No suicidal or homicidal ideation. Still intermittently tearful.   Review of Systems No problems with concentration or focusing.    Objective:   Physical Exam  Bowel sounds are reviewed GENERAL: Well-developed overweight female no acute distress PSYCHIATRIC: Alert and oriented x4. Affect is interactive. Speech is normal and fluids in content. Good eye contact. Neatly dressed.      Assessment & Plan:

## 2014-01-24 ENCOUNTER — Encounter: Payer: Self-pay | Admitting: Family Medicine

## 2014-12-05 ENCOUNTER — Encounter (HOSPITAL_BASED_OUTPATIENT_CLINIC_OR_DEPARTMENT_OTHER): Payer: Self-pay | Admitting: *Deleted

## 2014-12-05 ENCOUNTER — Emergency Department (HOSPITAL_BASED_OUTPATIENT_CLINIC_OR_DEPARTMENT_OTHER)
Admission: EM | Admit: 2014-12-05 | Discharge: 2014-12-05 | Disposition: A | Discharge status: Home / Self Care | Payer: BLUE CROSS/BLUE SHIELD | Attending: Emergency Medicine | Admitting: Emergency Medicine

## 2014-12-05 ENCOUNTER — Emergency Department (HOSPITAL_BASED_OUTPATIENT_CLINIC_OR_DEPARTMENT_OTHER): Payer: BLUE CROSS/BLUE SHIELD

## 2014-12-05 DIAGNOSIS — Z79899 Other long term (current) drug therapy: Secondary | ICD-10-CM | POA: Diagnosis not present

## 2014-12-05 DIAGNOSIS — Z8659 Personal history of other mental and behavioral disorders: Secondary | ICD-10-CM | POA: Insufficient documentation

## 2014-12-05 DIAGNOSIS — J069 Acute upper respiratory infection, unspecified: Secondary | ICD-10-CM | POA: Diagnosis not present

## 2014-12-05 DIAGNOSIS — R05 Cough: Secondary | ICD-10-CM | POA: Diagnosis present

## 2014-12-05 NOTE — Discharge Instructions (Signed)
Knee Pain °The knee is the complex joint between your thigh and your lower leg. It is made up of bones, tendons, ligaments, and cartilage. The bones that make up the knee are: °· The femur in the thigh. °· The tibia and fibula in the lower leg. °· The patella or kneecap riding in the groove on the lower femur. °CAUSES  °Knee pain is a common complaint with many causes. A few of these causes are: °· Injury, such as: °¨ A ruptured ligament or tendon injury. °¨ Torn cartilage. °· Medical conditions, such as: °¨ Gout °¨ Arthritis °¨ Infections °· Overuse, over training, or overdoing a physical activity. °Knee pain can be minor or severe. Knee pain can accompany debilitating injury. Minor knee problems often respond well to self-care measures or get well on their own. More serious injuries may need medical intervention or even surgery. °SYMPTOMS °The knee is complex. Symptoms of knee problems can vary widely. Some of the problems are: °· Pain with movement and weight bearing. °· Swelling and tenderness. °· Buckling of the knee. °· Inability to straighten or extend your knee. °· Your knee locks and you cannot straighten it. °· Warmth and redness with pain and fever. °· Deformity or dislocation of the kneecap. °DIAGNOSIS  °Determining what is wrong may be very straight forward such as when there is an injury. It can also be challenging because of the complexity of the knee. Tests to make a diagnosis may include: °· Your caregiver taking a history and doing a physical exam. °· Routine X-rays can be used to rule out other problems. X-rays will not reveal a cartilage tear. Some injuries of the knee can be diagnosed by: °¨ Arthroscopy a surgical technique by which a small video camera is inserted through tiny incisions on the sides of the knee. This procedure is used to examine and repair internal knee joint problems. Tiny instruments can be used during arthroscopy to repair the torn knee cartilage (meniscus). °¨ Arthrography  is a radiology technique. A contrast liquid is directly injected into the knee joint. Internal structures of the knee joint then become visible on X-ray film. °¨ An MRI scan is a non X-ray radiology procedure in which magnetic fields and a computer produce two- or three-dimensional images of the inside of the knee. Cartilage tears are often visible using an MRI scanner. MRI scans have largely replaced arthrography in diagnosing cartilage tears of the knee. °· Blood work. °· Examination of the fluid that helps to lubricate the knee joint (synovial fluid). This is done by taking a sample out using a needle and a syringe. °TREATMENT °The treatment of knee problems depends on the cause. Some of these treatments are: °· Depending on the injury, proper casting, splinting, surgery, or physical therapy care will be needed. °· Give yourself adequate recovery time. Do not overuse your joints. If you begin to get sore during workout routines, back off. Slow down or do fewer repetitions. °· For repetitive activities such as cycling or running, maintain your strength and nutrition. °· Alternate muscle groups. For example, if you are a weight lifter, work the upper body on one day and the lower body the next. °· Either tight or weak muscles do not give the proper support for your knee. Tight or weak muscles do not absorb the stress placed on the knee joint. Keep the muscles surrounding the knee strong. °· Take care of mechanical problems. °¨ If you have flat feet, orthotics or special shoes may help.   See your caregiver if you need help. °¨ Arch supports, sometimes with wedges on the inner or outer aspect of the heel, can help. These can shift pressure away from the side of the knee most bothered by osteoarthritis. °¨ A brace called an "unloader" brace also may be used to help ease the pressure on the most arthritic side of the knee. °· If your caregiver has prescribed crutches, braces, wraps or ice, use as directed. The acronym  for this is PRICE. This means protection, rest, ice, compression, and elevation. °· Nonsteroidal anti-inflammatory drugs (NSAIDs), can help relieve pain. But if taken immediately after an injury, they may actually increase swelling. Take NSAIDs with food in your stomach. Stop them if you develop stomach problems. Do not take these if you have a history of ulcers, stomach pain, or bleeding from the bowel. Do not take without your caregiver's approval if you have problems with fluid retention, heart failure, or kidney problems. °· For ongoing knee problems, physical therapy may be helpful. °· Glucosamine and chondroitin are over-the-counter dietary supplements. Both may help relieve the pain of osteoarthritis in the knee. These medicines are different from the usual anti-inflammatory drugs. Glucosamine may decrease the rate of cartilage destruction. °· Injections of a corticosteroid drug into your knee joint may help reduce the symptoms of an arthritis flare-up. They may provide pain relief that lasts a few months. You may have to wait a few months between injections. The injections do have a small increased risk of infection, water retention, and elevated blood sugar levels. °· Hyaluronic acid injected into damaged joints may ease pain and provide lubrication. These injections may work by reducing inflammation. A series of shots may give relief for as long as 6 months. °· Topical painkillers. Applying certain ointments to your skin may help relieve the pain and stiffness of osteoarthritis. Ask your pharmacist for suggestions. Many over the-counter products are approved for temporary relief of arthritis pain. °· In some countries, doctors often prescribe topical NSAIDs for relief of chronic conditions such as arthritis and tendinitis. A review of treatment with NSAID creams found that they worked as well as oral medications but without the serious side effects. °PREVENTION °· Maintain a healthy weight. Extra pounds  put more strain on your joints. °· Get strong, stay limber. Weak muscles are a common cause of knee injuries. Stretching is important. Include flexibility exercises in your workouts. °· Be smart about exercise. If you have osteoarthritis, chronic knee pain or recurring injuries, you may need to change the way you exercise. This does not mean you have to stop being active. If your knees ache after jogging or playing basketball, consider switching to swimming, water aerobics, or other low-impact activities, at least for a few days a week. Sometimes limiting high-impact activities will provide relief. °· Make sure your shoes fit well. Choose footwear that is right for your sport. °· Protect your knees. Use the proper gear for knee-sensitive activities. Use kneepads when playing volleyball or laying carpet. Buckle your seat belt every time you drive. Most shattered kneecaps occur in car accidents. °· Rest when you are tired. °SEEK MEDICAL CARE IF:  °You have knee pain that is continual and does not seem to be getting better.  °SEEK IMMEDIATE MEDICAL CARE IF:  °Your knee joint feels hot to the touch and you have a high fever. °MAKE SURE YOU:  °· Understand these instructions. °· Will watch your condition. °· Will get help right away if you are not   doing well or get worse. Document Released: 01/06/2007 Document Revised: 06/03/2011 Document Reviewed: 01/06/2007 [facility] Patient Information 2015 [place], [place]. This information is not intended to replace advice given to you by your health care provider. Make sure you discuss any questions you have with your health care provider. Upper Respiratory Infection, Adult An upper respiratory infection (URI) is also sometimes known as the common cold. The upper respiratory tract includes the nose, sinuses, throat, trachea, and bronchi. Bronchi are the airways leading to the lungs. Most people improve within 1 week, but symptoms can last up to 2 weeks. A residual cough may last  even longer.  CAUSES Many different viruses can infect the tissues lining the upper respiratory tract. The tissues become irritated and inflamed and often become very moist. Mucus production is also common. A cold is contagious. You can easily spread the virus to others by oral contact. This includes kissing, sharing a glass, coughing, or sneezing. Touching your mouth or nose and then touching a surface, which is then touched by another person, can also spread the virus. SYMPTOMS  Symptoms typically develop 1 to 3 days after you come in contact with a cold virus. Symptoms vary from person to person. They may include:  Runny nose.  Sneezing.  Nasal congestion.  Sinus irritation.  Sore throat.  Loss of voice (laryngitis).  Cough.  Fatigue.  Muscle aches.  Loss of appetite.  Headache.  Low-grade fever. DIAGNOSIS  You might diagnose your own cold based on familiar symptoms, since most people get a cold 2 to 3 times a year. Your caregiver can confirm this based on your exam. Most importantly, your caregiver can check that your symptoms are not due to another disease such as strep throat, sinusitis, pneumonia, asthma, or epiglottitis. Blood tests, throat tests, and X-rays are not necessary to diagnose a common cold, but they may sometimes be helpful in excluding other more serious diseases. Your caregiver will decide if any further tests are required. RISKS AND COMPLICATIONS  You may be at risk for a more severe case of the common cold if you smoke cigarettes, have chronic heart disease (such as heart failure) or lung disease (such as asthma), or if you have a weakened immune system. The very young and very old are also at risk for more serious infections. Bacterial sinusitis, middle ear infections, and bacterial pneumonia can complicate the common cold. The common cold can worsen asthma and chronic obstructive pulmonary disease (COPD). Sometimes, these complications can require emergency  medical care and may be life-threatening. PREVENTION  The best way to protect against getting a cold is to practice good hygiene. Avoid oral or hand contact with people with cold symptoms. Wash your hands often if contact occurs. There is no clear evidence that vitamin C, vitamin E, echinacea, or exercise reduces the chance of developing a cold. However, it is always recommended to get plenty of rest and practice good nutrition. TREATMENT  Treatment is directed at relieving symptoms. There is no cure. Antibiotics are not effective, because the infection is caused by a virus, not by bacteria. Treatment may include:  Increased fluid intake. Sports drinks offer valuable electrolytes, sugars, and fluids.  Breathing heated mist or steam (vaporizer or shower).  Eating chicken soup or other clear broths, and maintaining good nutrition.  Getting plenty of rest.  Using gargles or lozenges for comfort.  Controlling fevers with ibuprofen or acetaminophen as directed by your caregiver.  Increasing usage of your inhaler if you have asthma. Zinc  gel and zinc lozenges, taken in the first 24 hours of the common cold, can shorten the duration and lessen the severity of symptoms. Pain medicines may help with fever, muscle aches, and throat pain. A variety of non-prescription medicines are available to treat congestion and runny nose. Your caregiver can make recommendations and may suggest nasal or lung inhalers for other symptoms.  HOME CARE INSTRUCTIONS   Only take over-the-counter or prescription medicines for pain, discomfort, or fever as directed by your caregiver.  Use a warm mist humidifier or inhale steam from a shower to increase air moisture. This may keep secretions moist and make it easier to breathe.  Drink enough water and fluids to keep your urine clear or pale yellow.  Rest as needed.  Return to work when your temperature has returned to normal or as your caregiver advises. You may need to  stay home longer to avoid infecting others. You can also use a face mask and careful hand washing to prevent spread of the virus. SEEK MEDICAL CARE IF:   After the first few days, you feel you are getting worse rather than better.  You need your caregiver's advice about medicines to control symptoms.  You develop chills, worsening shortness of breath, or brown or red sputum. These may be signs of pneumonia.  You develop yellow or brown nasal discharge or pain in the face, especially when you bend forward. These may be signs of sinusitis.  You develop a fever, swollen neck glands, pain with swallowing, or white areas in the back of your throat. These may be signs of strep throat. SEEK IMMEDIATE MEDICAL CARE IF:   You have a fever.  You develop severe or persistent headache, ear pain, sinus pain, or chest pain.  You develop wheezing, a prolonged cough, cough up blood, or have a change in your usual mucus (if you have chronic lung disease).  You develop sore muscles or a stiff neck. Document Released: 09/04/2000 Document Revised: 06/03/2011 Document Reviewed: 06/16/2013 [facility] Patient Information 2015 [place], [place]. This information is not intended to replace advice given to you by your health care provider. Make sure you discuss any questions you have with your health care provider.

## 2014-12-05 NOTE — ED Provider Notes (Signed)
CSN: [id number]     Arrival date & time 12/05/14  2030 History   First MD Initiated Contact with Patient 12/05/14 2123     Chief Complaint  Patient presents with  . URI     (Consider location/radiation/quality/duration/timing/severity/associated sxs/prior Treatment) HPI Comments: Patient presents to the emergency department with chief complaints of cough, sore throat, rhinorrhea, and some chest discomfort from coughing. She states that her symptoms started several days ago. She denies any fevers or chills. She denies any nausea or vomiting. She has not tried taking anything to alleviate her symptoms.   Additionally, patient complains of right knee pain. She states that she has had some cracking and popping in her right knee. She denies any known mechanism of injury. She states the symptoms have been ongoing for a while now.  The history is provided by the patient. No language interpreter was used.    Past Medical History  Diagnosis Date  . Allergy   . Depression    Past Surgical History  Procedure Laterality Date  . Tubal ligation    . Cesarean section      2005   Family History  Problem Relation Age of Onset  . Asthma Mother   . Cancer Father   . Diabetes Father   . Hypertension Father   . Kidney disease Father   . Depression Sister   . Kidney disease Maternal Grandmother   . Cancer Paternal Grandmother   . Hyperlipidemia Paternal Grandmother    Social History  Substance Use Topics  . Smoking status: Never Smoker   . Smokeless tobacco: None  . Alcohol Use: No   OB History    Gravida Para Term Preterm AB TAB SAB Ectopic Multiple Living   Review of Systems  Constitutional: Positive for chills. Negative for fever.  HENT: Positive for postnasal drip, rhinorrhea, sinus pressure, sneezing and sore throat.   Respiratory: Positive for cough. Negative for shortness of breath.   Cardiovascular: Negative for chest pain.  Gastrointestinal: Negative for  nausea, vomiting, abdominal pain, diarrhea and constipation.  Genitourinary: Negative for dysuria.  Musculoskeletal: Positive for arthralgias.  All other systems reviewed and are negative.     Allergies  Review of patient's allergies indicates no known allergies.  Home Medications   Prior to Admission medications   Medication Sig Start Date End Date Taking? Authorizing Provider  Capsaicin 0.15 % LIQD Apply to arms three times a day wash hands after use may cause mild heat sensation initially do not get in eyes 08/11/13   Nestor Ramp, MD  propranolol (INDERAL) 20 MG tablet Take 1 tablet (20 mg total) by mouth 3 (three) times daily. 07/28/13   Nestor Ramp, MD   BP 114/72 mmHg  Pulse 88  Temp(Src) 98.8 F (37.1 C) (Oral)  Resp 20  Ht  (1.702 m)  Wt 323 lb (146.512 kg)  BMI 50.58 kg/m2  SpO2 99%  LMP 12/05/2014 Physical Exam  Constitutional: She appears well-developed and well-nourished. No distress.  HENT:  Head: Normocephalic.  Right Ear: External ear normal.  Left Ear: External ear normal.  Mildly erythematous, no tonsillar exudate, no abscess, no stridor, uvula is midline  TMs clear bilaterally  Eyes: Conjunctivae and EOM are normal. Pupils are equal, round, and reactive to light.  Neck: Normal range of motion. Neck supple.  Cardiovascular: Normal rate, regular rhythm and normal heart sounds.  Exam reveals no gallop  and no friction rub.   No murmur heard. Pulmonary/Chest: Effort normal and breath sounds normal. No stridor. No respiratory distress. She has no wheezes. She has no rales. She exhibits no tenderness.  CTAB  Abdominal: Soft. Bowel sounds are normal. She exhibits no distension. There is no tenderness.  Musculoskeletal: Normal range of motion. She exhibits no tenderness.  Right knee moderately tender to palpation over the inferior medial joint line, no bony abnormality or deformity, range of motion strength 5/5, joint stability testing deferred secondary to  body habitus  Neurological: She is alert.  Skin: Skin is warm and dry. No rash noted. She is not diaphoretic.  Psychiatric: She has a normal mood and affect. Her behavior is normal. Judgment and thought content normal.  Nursing note and vitals reviewed.   ED Course  Procedures (including critical care time) Results for orders placed or performed during the hospital encounter of 08/26/10  POCT rapid strep A (device)  Result Value Ref Range   Streptococcus, Group A Screen (Direct) NEGATIVE NEGATIVE   Dg Chest 2 View  12/05/2014   CLINICAL DATA:  Cough and sore throat beginning yesterday, chest pain radiating to the back.  EXAM: CHEST  2 VIEW  COMPARISON:  Chest radiograph June 25, 2006  FINDINGS: Cardiomediastinal silhouette is normal. The lungs are clear without pleural effusions or focal consolidations. Mild peribronchial wall thickening. Trachea projects midline and there is no pneumothorax. Soft tissue planes and included osseous structures are non-suspicious.  IMPRESSION: Mild bronchitic changes without focal consolidation.   Electronically Signed   By: Awilda Metro M.D.   On: 12/05/2014 22:08   Dg Knee Complete 4 Views Right  12/05/2014   CLINICAL DATA:  RIGHT knee pain for 2 weeks, worse today. Unable to bear weight or walk.  EXAM: RIGHT KNEE - COMPLETE 4+ VIEW  COMPARISON:  RIGHT knee radiographs October 31, 2008  FINDINGS: No acute fracture deformity or dislocation. Mild progressed patellofemoral compartment narrowing consistent with early osteoarthrosis. Mild medial compartment marginal spurring. Mild tibial spine peaking. No destructive bony lesions. Large body habitus.  IMPRESSION: Mild degenerative change of the knee without acute fracture deformity or dislocation.   Electronically Signed   By: Awilda Metro M.D.   On: 12/05/2014 22:10      EKG Interpretation   Date/Time:  Monday December 05 2014 20:49:00 EDT Ventricular Rate:  89 PR Interval:  166 QRS Duration: 70 QT  Interval:  348 QTC Calculation: 423 R Axis:   27 Text Interpretation:  Normal sinus rhythm Normal ECG No previous tracing  Confirmed by Anitra Lauth  MD, Alphonzo Lemmings ([id number]) on 12/05/2014 8:50:34 PM      MDM   Final diagnoses:  URI (upper respiratory infection)    Pt CXR negative for acute infiltrate. Patients symptoms are consistent with URI, likely viral etiology. Discussed that antibiotics are not indicated for viral infections. Pt will be discharged with symptomatic treatment.  Verbalizes understanding and is agreeable with plan. Pt is hemodynamically stable & in NAD prior to dc.   Regarding right knee pain, will recommend orthopedic follow-up.    Roxy Horseman, PA-C 12/05/14 2300  Gwyneth Sprout, MD 12/06/14 0030

## 2014-12-05 NOTE — ED Notes (Signed)
Sore throat, cough, pain in her anterior chest into her back. Pain in her right knee. Popping.

## 2014-12-07 ENCOUNTER — Encounter: Payer: Self-pay | Admitting: Family Medicine

## 2014-12-07 ENCOUNTER — Ambulatory Visit (INDEPENDENT_AMBULATORY_CARE_PROVIDER_SITE_OTHER): Payer: BLUE CROSS/BLUE SHIELD | Admitting: Family Medicine

## 2014-12-07 VITALS — BP 125/74 | HR 81 | Temp 97.9°F | Ht 67.0 in | Wt 319.3 lb

## 2014-12-07 DIAGNOSIS — J0100 Acute maxillary sinusitis, unspecified: Secondary | ICD-10-CM

## 2014-12-07 DIAGNOSIS — J029 Acute pharyngitis, unspecified: Secondary | ICD-10-CM | POA: Diagnosis not present

## 2014-12-07 LAB — POCT RAPID STREP A (OFFICE): Rapid Strep A Screen: NEGATIVE

## 2014-12-07 MED ORDER — AMOXICILLIN 500 MG PO CAPS: 500.0000 mg | ORAL_CAPSULE | Freq: Three times a day (TID) | ORAL | Status: DC

## 2014-12-07 NOTE — Patient Instructions (Signed)
I have called in an antibiotic. Please also take 800 mg of ibuprofen every 8 hours for the next 2-3 days. It is for of the over-the-counter tablets every 8 hours. Drink plenty of fluids.. Not getting better in about a week, let me know.

## 2014-12-09 NOTE — Progress Notes (Signed)
   Subjective:    Patient ID: Sara [name], female    DOB: March 20, 1976, 39 y.o.   MRN: [id number]  HPI Close to 10 days of nasal congestion, sore throat. Headache. Was told it was a viral upper rest or infection but seems to be getting worse. She think she's had some fever at home but no chills. She's had some hoarseness. No cough.  Pertinent past medical history: She is a nonsmoker, no diabetes.  Review of Systems See history of present illness.    Objective:   Physical Exam GEN.: Well-developed female no acute distress HEENT: Oropharynx is erythematous. There is no exudate. Neck is without lymphadenopathy. TMs bilaterally are retracted with poor landmarks. Nasal mucosa is extremely swollen and boggy. Tender to palpation over the left maxillary sinus. LUNGS: Clear to auscultation bilaterally Laboratory: Rapid strep negative.       Assessment & Plan:  I think this started as a viral URI and is now progressing into acute maxillary sinusitis so we'll treat her with anabiotic therapy. If not improving, she'll let me know.

## 2015-04-20 ENCOUNTER — Encounter: Payer: Self-pay | Admitting: Sports Medicine

## 2015-04-20 ENCOUNTER — Ambulatory Visit (INDEPENDENT_AMBULATORY_CARE_PROVIDER_SITE_OTHER): Payer: BLUE CROSS/BLUE SHIELD | Admitting: Sports Medicine

## 2015-04-20 ENCOUNTER — Ambulatory Visit
Admission: RE | Admit: 2015-04-20 | Discharge: 2015-04-20 | Disposition: A | Discharge status: Home / Self Care | Payer: BLUE CROSS/BLUE SHIELD | Source: Ambulatory Visit | Attending: Sports Medicine | Admitting: Sports Medicine

## 2015-04-20 VITALS — BP 126/92 | Ht 67.0 in | Wt 303.0 lb

## 2015-04-20 DIAGNOSIS — M25561 Pain in right knee: Secondary | ICD-10-CM

## 2015-04-21 ENCOUNTER — Ambulatory Visit: Payer: BLUE CROSS/BLUE SHIELD | Admitting: Family Medicine

## 2015-04-21 NOTE — Progress Notes (Signed)
   Subjective:    Patient ID: Sara [name], female    DOB: [birth year], 40 y.o.   MRN: [id number]  HPI chief complaint: Right knee pain  Very pleasant 40 year old female comes in today complaining of 3 weeks of right knee pain. She injured the knee while visiting [place]. She slipped in some water and landed directly on her right patella. She had immediate pain and swelling. Since then she has noticed intermittent swelling and stiffness particularly with activity. She is also getting some painful popping with twisting and turning. She has had some issues with this same knee in the past. In fact, she had an x-ray done in September 2016 that showed some mild degenerative changes. However, her pain that she is currently experiencing is different in nature than what she experienced a few months ago. She has been taking Advil and Aleve but neither one seem to be working. She denies any prior knee surgeries. No associated numbness or tingling. No locking of the knee.  Past medical history reviewed Medications reviewed Allergies reviewed    Review of Systems    as above  Objective:   Physical Exam  Obese. No acute distress. Awake alert and oriented 3. Vital signs reviewed  Right knee: Examination of the right knee is difficult due to body habitus and pain. Range of motion is 0-90. It is difficult to appreciate any sort of an effusion due to her body habitus. She is diffusely tender to palpation along the medial aspect of the knee. Knee is grossly stable to valgus and varus stressing but anterior and posterior drawer are difficult to evaluate. She does have pain with McMurray's. Extensor mechanism is intact. I do not appreciate any significant pre- patellar swelling. Neurovascularly intact distally. Walking with a limp.  X-rays of the right knee are obtained and compared x-rays from 12/05/2014. Overall there has been no change. She has some mild degenerative changes but nothing acute.     Assessment & Plan:  Right knee pain-rule out internal derangement  MRI of the right knee specifically to rule out any sort of cruciate or meniscal injury. Phone follow-up after those results are reviewed. We will delineate definitive treatment based on those findings. In the meantime she will use an Ace wrap for compression and comfort.

## 2015-04-23 ENCOUNTER — Ambulatory Visit
Admission: RE | Admit: 2015-04-23 | Discharge: 2015-04-23 | Disposition: A | Discharge status: Home / Self Care | Payer: BLUE CROSS/BLUE SHIELD | Source: Ambulatory Visit | Attending: Sports Medicine | Admitting: Sports Medicine

## 2015-04-23 DIAGNOSIS — M25561 Pain in right knee: Secondary | ICD-10-CM

## 2015-04-26 ENCOUNTER — Telehealth: Payer: Self-pay | Admitting: Sports Medicine

## 2015-04-26 DIAGNOSIS — M2241 Chondromalacia patellae, right knee: Secondary | ICD-10-CM

## 2015-04-26 DIAGNOSIS — M25561 Pain in right knee: Secondary | ICD-10-CM

## 2015-04-26 HISTORY — DX: Chondromalacia patellae, right knee: M22.41

## 2015-04-26 NOTE — Telephone Encounter (Signed)
I spoke with the patient on the phone today after reviewing the MRI of her right knee. She has some linear grade 2 signal to the posterior horn and mid body of the medial meniscus but it does not appear to extend to the surface. She also has a small 7 mm OCD in the lateral femoral condyle which may be posttraumatic. This is all in the setting of some medial compartmental osteoarthritis. Her knee pain is severe. She is having difficulty performing her duties at work. I've recommended surgical consultation with one of the orthopedic surgeons at [facility] orthopedics. They may decide to treat this conservatively, but given the possibility of a small traumatic OCD in the lateral femoral condyle, I would like their input and treatment from this point forward. I will keep her out of work until her appointment with the orthopedist. She will follow-up with me as needed.

## 2015-04-26 NOTE — Addendum Note (Signed)
Addended by: Annita Brod on: 04/26/2015 02:54 PM   Modules accepted: Orders

## 2015-04-28 ENCOUNTER — Telehealth: Payer: Self-pay | Admitting: *Deleted

## 2015-04-28 DIAGNOSIS — M25561 Pain in right knee: Secondary | ICD-10-CM

## 2015-04-28 NOTE — Telephone Encounter (Signed)
[facility] Dr Teryl Lucy Monday 05/01/15 at 11am [address] [place] [address] [phone]

## 2015-05-01 ENCOUNTER — Encounter (HOSPITAL_BASED_OUTPATIENT_CLINIC_OR_DEPARTMENT_OTHER): Payer: Self-pay | Admitting: *Deleted

## 2015-05-02 ENCOUNTER — Encounter (HOSPITAL_BASED_OUTPATIENT_CLINIC_OR_DEPARTMENT_OTHER): Payer: Self-pay | Admitting: *Deleted

## 2015-05-02 NOTE — Pre-Procedure Instructions (Signed)
To come for anesthesia evaluation of airway.

## 2015-05-04 ENCOUNTER — Other Ambulatory Visit: Payer: Self-pay | Admitting: Orthopedic Surgery

## 2015-05-04 MED ORDER — LACTATED RINGERS IV SOLN: INTRAVENOUS | Status: DC

## 2015-05-04 MED ORDER — SCOPOLAMINE 1 MG/3DAYS TD PT72: 1.0000 | MEDICATED_PATCH | Freq: Once | TRANSDERMAL | Status: DC | PRN

## 2015-05-04 MED ORDER — MIDAZOLAM HCL 2 MG/2ML IJ SOLN: 1.0000 mg | INTRAMUSCULAR | Status: DC | PRN

## 2015-05-04 MED ORDER — FENTANYL CITRATE (PF) 100 MCG/2ML IJ SOLN: 50.0000 ug | INTRAMUSCULAR | Status: DC | PRN

## 2015-05-04 MED ORDER — GLYCOPYRROLATE 0.2 MG/ML IJ SOLN: 0.2000 mg | Freq: Once | INTRAMUSCULAR | Status: DC | PRN

## 2015-05-04 NOTE — Pre-Procedure Instructions (Signed)
Here for anesthesia evaluation; actual weight 331.4, which puts BMI at 52.  Dr. Okey Dupre notified - is above guideline limit of BMI 50, will need to be done at Main OR.  Sherry at Dr. Shelba Flake office notified.

## 2015-05-04 NOTE — Progress Notes (Signed)
I was unable to reach patient by phone.  I left  A message on voice mail.  I instructed the patient to arrive at [facility] Main entrance at 1115   , nothing to eat or drink after midnight.   I instructed the patient to take no  medications in the am.  I asked patient to not wear any lotions, powders, cologne, jewelry, piercing, make-up or nail polish.  I asked the patient to call [phone], in the am if there were any questions or problems.

## 2015-05-05 ENCOUNTER — Ambulatory Visit (HOSPITAL_COMMUNITY): Payer: BLUE CROSS/BLUE SHIELD | Admitting: Anesthesiology

## 2015-05-05 ENCOUNTER — Encounter (HOSPITAL_COMMUNITY): Payer: Self-pay | Admitting: *Deleted

## 2015-05-05 ENCOUNTER — Ambulatory Visit (HOSPITAL_BASED_OUTPATIENT_CLINIC_OR_DEPARTMENT_OTHER)
Admission: RE | Admit: 2015-05-05 | Discharge: 2015-05-05 | Disposition: A | Discharge status: Home / Self Care | Payer: BLUE CROSS/BLUE SHIELD | Source: Ambulatory Visit | Attending: Orthopedic Surgery | Admitting: Orthopedic Surgery

## 2015-05-05 ENCOUNTER — Encounter (HOSPITAL_COMMUNITY)
Admission: RE | Disposition: A | Discharge status: Home / Self Care | Payer: Self-pay | Source: Ambulatory Visit | Attending: Orthopedic Surgery

## 2015-05-05 DIAGNOSIS — Z6841 Body Mass Index (BMI) 40.0 and over, adult: Secondary | ICD-10-CM | POA: Diagnosis not present

## 2015-05-05 DIAGNOSIS — M1990 Unspecified osteoarthritis, unspecified site: Secondary | ICD-10-CM | POA: Insufficient documentation

## 2015-05-05 DIAGNOSIS — M2241 Chondromalacia patellae, right knee: Secondary | ICD-10-CM | POA: Insufficient documentation

## 2015-05-05 DIAGNOSIS — M2240 Chondromalacia patellae, unspecified knee: Secondary | ICD-10-CM | POA: Diagnosis present

## 2015-05-05 HISTORY — DX: Morbid (severe) obesity due to excess calories: E66.01

## 2015-05-05 HISTORY — DX: Unspecified osteoarthritis, unspecified site: M19.90

## 2015-05-05 HISTORY — PX: KNEE ARTHROSCOPY: SHX127

## 2015-05-05 HISTORY — DX: Chondromalacia patellae, right knee: M22.41

## 2015-05-05 HISTORY — DX: Chondromalacia patellae, unspecified knee: M22.40

## 2015-05-05 LAB — CBC
HEMATOCRIT: 34 % — AB (ref 36.0–46.0)
Hemoglobin: 11.2 g/dL — ABNORMAL LOW (ref 12.0–15.0)
MCH: 22.6 pg — ABNORMAL LOW (ref 26.0–34.0)
MCHC: 32.9 g/dL (ref 30.0–36.0)
MCV: 68.5 fL — AB (ref 78.0–100.0)
Platelets: 311 10*3/uL (ref 150–400)
RBC: 4.96 MIL/uL (ref 3.87–5.11)
RDW: 15.5 % (ref 11.5–15.5)
WBC: 6.8 10*3/uL (ref 4.0–10.5)

## 2015-05-05 LAB — HCG, SERUM, QUALITATIVE: Preg, Serum: NEGATIVE

## 2015-05-05 SURGERY — ARTHROSCOPY, KNEE, WITH MENISCUS REPAIR
Anesthesia: Choice | Site: Knee | Laterality: Right

## 2015-05-05 SURGERY — ARTHROSCOPY, KNEE
Anesthesia: General | Site: Knee | Laterality: Right

## 2015-05-05 MED ORDER — PROMETHAZINE HCL 25 MG/ML IJ SOLN: 6.2500 mg | INTRAMUSCULAR | Status: DC | PRN

## 2015-05-05 MED ORDER — KETOROLAC TROMETHAMINE 10 MG PO TABS: 10.0000 mg | ORAL_TABLET | Freq: Three times a day (TID) | ORAL | Status: DC

## 2015-05-05 MED ORDER — MIDAZOLAM HCL 5 MG/5ML IJ SOLN
INTRAMUSCULAR | Status: DC | PRN
  Administered 2015-05-05: 2 mg via INTRAVENOUS

## 2015-05-05 MED ORDER — BUPIVACAINE HCL 0.5 % IJ SOLN
INTRAMUSCULAR | Status: DC | PRN
  Administered 2015-05-05: 30 mL

## 2015-05-05 MED ORDER — SODIUM CHLORIDE 0.9 % IR SOLN
Status: DC | PRN
  Administered 2015-05-05: 3000 mL

## 2015-05-05 MED ORDER — FENTANYL CITRATE (PF) 100 MCG/2ML IJ SOLN
25.0000 ug | INTRAMUSCULAR | Status: DC | PRN
  Administered 2015-05-05: 50 ug via INTRAVENOUS

## 2015-05-05 MED ORDER — PROPOFOL 10 MG/ML IV BOLUS
INTRAVENOUS | Status: DC | PRN
  Administered 2015-05-05: 200 mg via INTRAVENOUS

## 2015-05-05 MED ORDER — FENTANYL CITRATE (PF) 100 MCG/2ML IJ SOLN
INTRAMUSCULAR | Status: DC | PRN
  Administered 2015-05-05: 25 ug via INTRAVENOUS
  Administered 2015-05-05: 50 ug via INTRAVENOUS
  Administered 2015-05-05: 25 ug via INTRAVENOUS
  Administered 2015-05-05: 50 ug via INTRAVENOUS

## 2015-05-05 MED ORDER — SENNA-DOCUSATE SODIUM 8.6-50 MG PO TABS: 2.0000 | ORAL_TABLET | Freq: Every day | ORAL | Status: DC

## 2015-05-05 MED ORDER — MEPERIDINE HCL 25 MG/ML IJ SOLN: 6.2500 mg | INTRAMUSCULAR | Status: DC | PRN

## 2015-05-05 MED ORDER — LACTATED RINGERS IV SOLN: INTRAVENOUS | Status: DC

## 2015-05-05 MED ORDER — LIDOCAINE HCL (CARDIAC) 20 MG/ML IV SOLN
INTRAVENOUS | Status: DC | PRN
  Administered 2015-05-05: 50 mg via INTRAVENOUS

## 2015-05-05 MED ORDER — OXYCODONE-ACETAMINOPHEN 5-325 MG PO TABS: 1.0000 | ORAL_TABLET | Freq: Four times a day (QID) | ORAL | Status: DC | PRN

## 2015-05-05 MED ORDER — BUPIVACAINE HCL (PF) 0.5 % IJ SOLN
INTRAMUSCULAR | Status: AC
Start: 2015-05-05 — End: 2015-05-05
  Filled 2015-05-05: qty 30

## 2015-05-05 MED ORDER — MIDAZOLAM HCL 2 MG/2ML IJ SOLN
INTRAMUSCULAR | Status: AC
  Filled 2015-05-05: qty 2

## 2015-05-05 MED ORDER — ONDANSETRON HCL 4 MG PO TABS: 4.0000 mg | ORAL_TABLET | Freq: Three times a day (TID) | ORAL | Status: DC | PRN

## 2015-05-05 MED ORDER — FENTANYL CITRATE (PF) 250 MCG/5ML IJ SOLN
INTRAMUSCULAR | Status: AC
  Filled 2015-05-05: qty 5

## 2015-05-05 MED ORDER — DEXTROSE 5 % IV SOLN
3.0000 g | INTRAVENOUS | Status: AC
  Administered 2015-05-05: 3 g via INTRAVENOUS
  Filled 2015-05-05: qty 3000

## 2015-05-05 MED ORDER — LACTATED RINGERS IV SOLN
INTRAVENOUS | Status: DC | PRN
  Administered 2015-05-05: 13:00:00 via INTRAVENOUS

## 2015-05-05 MED ORDER — FENTANYL CITRATE (PF) 100 MCG/2ML IJ SOLN
INTRAMUSCULAR | Status: AC
  Filled 2015-05-05: qty 2

## 2015-05-05 SURGICAL SUPPLY — 54 items
BANDAGE ELASTIC 6 VELCRO ST LF (GAUZE/BANDAGES/DRESSINGS) ×1 IMPLANT
BANDAGE ESMARK 6X9 LF (GAUZE/BANDAGES/DRESSINGS) IMPLANT
BLADE CUTTER GATOR 3.5 (BLADE) ×1 IMPLANT
BLADE SURG 11 STRL SS (BLADE) ×1 IMPLANT
BLADE SURG ROTATE 9660 (MISCELLANEOUS) IMPLANT
BNDG CMPR 9X6 STRL LF SNTH (GAUZE/BANDAGES/DRESSINGS)
BNDG ESMARK 6X9 LF (GAUZE/BANDAGES/DRESSINGS)
CLSR STERI-STRIP ANTIMIC 1/2X4 (GAUZE/BANDAGES/DRESSINGS) ×1 IMPLANT
COVER SURGICAL LIGHT HANDLE (MISCELLANEOUS) ×2 IMPLANT
CUFF TOURNIQUET SINGLE 34IN LL (TOURNIQUET CUFF) IMPLANT
CUTTER MENISCUS 3.5MM 6/BX (BLADE) IMPLANT
DRAPE ARTHROSCOPY W/POUCH 114 (DRAPES) ×2 IMPLANT
DRAPE PROXIMA HALF (DRAPES) ×1 IMPLANT
DRAPE U-SHAPE 47X51 STRL (DRAPES) ×2 IMPLANT
DRAPE U-SHAPE 76X120 STRL (DRAPES) ×1 IMPLANT
DRSG PAD ABDOMINAL 8X10 ST (GAUZE/BANDAGES/DRESSINGS) ×1 IMPLANT
GAUZE SPONGE 4X4 12PLY STRL (GAUZE/BANDAGES/DRESSINGS) IMPLANT
GAUZE XEROFORM 1X8 LF (GAUZE/BANDAGES/DRESSINGS) IMPLANT
GLOVE BIO SURGEON STRL SZ 6 (GLOVE) ×1 IMPLANT
GLOVE BIO SURGEON STRL SZ7 (GLOVE) ×1 IMPLANT
GLOVE BIO SURGEON STRL SZ7.5 (GLOVE) ×2 IMPLANT
GLOVE BIOGEL PI IND STRL 8 (GLOVE) ×1 IMPLANT
GLOVE BIOGEL PI INDICATOR 8 (GLOVE) ×1
GLOVE BIOGEL PI ORTHO PRO SZ8 (GLOVE) ×2
GLOVE PI ORTHO PRO STRL SZ8 (GLOVE) ×1 IMPLANT
GLOVE SURG ORTHO 8.0 STRL STRW (GLOVE) ×4 IMPLANT
GOWN STRL REUS W/ TWL LRG LVL3 (GOWN DISPOSABLE) ×2 IMPLANT
GOWN STRL REUS W/ TWL XL LVL3 (GOWN DISPOSABLE) ×2 IMPLANT
GOWN STRL REUS W/TWL LRG LVL3 (GOWN DISPOSABLE) ×6
GOWN STRL REUS W/TWL XL LVL3 (GOWN DISPOSABLE) ×6
KIT ROOM TURNOVER OR (KITS) ×2 IMPLANT
MANIFOLD NEPTUNE II (INSTRUMENTS) ×1 IMPLANT
NDL 18GX1X1/2 (RX/OR ONLY) (NEEDLE) IMPLANT
NDL SPNL 18GX3.5 QUINCKE PK (NEEDLE) IMPLANT
NEEDLE 18GX1X1/2 (RX/OR ONLY) (NEEDLE) ×2 IMPLANT
NEEDLE 22X1 1/2 (OR ONLY) (NEEDLE) ×3 IMPLANT
NEEDLE SPNL 18GX3.5 QUINCKE PK (NEEDLE) IMPLANT
NS IRRIG 1000ML POUR BTL (IV SOLUTION) IMPLANT
PACK ARTHROSCOPY DSU (CUSTOM PROCEDURE TRAY) ×2 IMPLANT
PAD ARMBOARD 7.5X6 YLW CONV (MISCELLANEOUS) ×4 IMPLANT
PAD CAST 4YDX4 CTTN HI CHSV (CAST SUPPLIES) IMPLANT
PADDING CAST COTTON 4X4 STRL (CAST SUPPLIES) ×2
PADDING CAST COTTON 6X4 STRL (CAST SUPPLIES) IMPLANT
SET ARTHROSCOPY TUBING (MISCELLANEOUS) ×2
SET ARTHROSCOPY TUBING LN (MISCELLANEOUS) ×1 IMPLANT
SPONGE LAP 4X18 X RAY DECT (DISPOSABLE) ×2 IMPLANT
SUT MNCRL AB 4-0 PS2 18 (SUTURE) ×2 IMPLANT
SYR 20ML ECCENTRIC (SYRINGE) IMPLANT
SYR CONTROL 10ML LL (SYRINGE) ×1 IMPLANT
TOWEL OR 17X24 6PK STRL BLUE (TOWEL DISPOSABLE) ×2 IMPLANT
TOWEL OR 17X26 10 PK STRL BLUE (TOWEL DISPOSABLE) ×2 IMPLANT
TUBE CONNECTING 12X1/4 (SUCTIONS) ×2 IMPLANT
WAND HAND CNTRL MULTIVAC 90 (MISCELLANEOUS) IMPLANT
WATER STERILE IRR 1000ML POUR (IV SOLUTION) ×2 IMPLANT

## 2015-05-05 NOTE — Anesthesia Preprocedure Evaluation (Addendum)
Anesthesia Evaluation  Patient identified by MRN, date of birth, ID band Patient awake    Reviewed: Allergy & Precautions, NPO status , Patient's Chart, lab work & pertinent test results  Airway Mallampati: II  TM Distance: >3 FB Neck ROM: Full    Dental no notable dental hx.    Pulmonary neg pulmonary ROS,    Pulmonary exam normal breath sounds clear to auscultation       Cardiovascular negative cardio ROS Normal cardiovascular exam Rhythm:Regular Rate:Normal     Neuro/Psych negative neurological ROS  negative psych ROS   GI/Hepatic negative GI ROS, Neg liver ROS,   Endo/Other  Morbid obesity  Renal/GU negative Renal ROS  negative genitourinary   Musculoskeletal negative musculoskeletal ROS (+)   Abdominal   Peds negative pediatric ROS (+)  Hematology negative hematology ROS (+)   Anesthesia Other Findings   Reproductive/Obstetrics negative OB ROS                             Anesthesia Physical Anesthesia Plan  ASA: II  Anesthesia Plan: General   Post-op Pain Management:    Induction: Intravenous  Airway Management Planned: LMA  Additional Equipment:   Intra-op Plan:   Post-operative Plan: Extubation in OR  Informed Consent: I have reviewed the patients History and Physical, chart, labs and discussed the procedure including the risks, benefits and alternatives for the proposed anesthesia with the patient or authorized representative who has indicated his/her understanding and acceptance.   Dental advisory given  Plan Discussed with: CRNA  Anesthesia Plan Comments:         Anesthesia Quick Evaluation  

## 2015-05-05 NOTE — Op Note (Signed)
05/05/2015  3:21 PM  PATIENT:  Sara [name]    PRE-OPERATIVE DIAGNOSIS:  CHONDROMALACIA PATELLA  POST-OPERATIVE DIAGNOSIS:  Same  PROCEDURE:  ARTHROSCOPY KNEE CHONDROPLASTY  SURGEON:  Eulas Post, MD  PHYSICIAN ASSISTANT: Janace Litten, OPA-C, present and scrubbed throughout the case, critical for completion in a timely fashion, and for retraction, instrumentation, and closure.  ANESTHESIA:   General  PREOPERATIVE INDICATIONS:  Sara [name] is a  40 y.o. female who fell and had acute severe pain over the patella and elected for surgical management.  She declined injections, and had tried anti-inflammatories and rest and ice, and had an MRI that demonstrated chondromalacia patella. I had a long discussion with her counseling her that I did not have great confidence that arthroscopic intervention would be successful, particularly given her obesity and the fact that some of this may be simply degenerative changes due to her morbid obesity and pressure on the kneecap. Nonetheless she wished to proceed.  The risks benefits and alternatives were discussed with the patient preoperatively including but not limited to the risks of infection, bleeding, nerve injury, cardiopulmonary complications, the need for revision surgery, among others, and the patient was willing to proceed.  OPERATIVE IMPLANTS: None  OPERATIVE FINDINGS: The anterior cruciate ligament and PCL were intact. The femoral trochlea was in good condition. The patella however had grade 2 and grade 3 changes that were fairly extensive. The lateral facet had some grade 3 changes, there was no exposed bone, there was also a small amount of grade 2 changes on the medial femoral condyle that was uncontained, and microfracture was not indicated. The lateral compartment was essentially normal. The medial meniscus and lateral meniscus were normal.  OPERATIVE PROCEDURE: The patient is brought to the operating room and placed in the supine  position. Gen. anesthesia was administered. IV antibiotics were given. The right lower extremity was prepped and draped in usual sterile fashion. Time out performed. Diagnostic arthroscopy carried out the above-named findings. The medial and lateral gutters were inspected and were normal. The patellar tracking was intact and reasonably good. I performed a chondroplasty both from the medial and lateral portals, switching portals to gain better access to each side of the patella. There was moderate wear on both the medial and lateral facets.  The femoral trochlea as indicated was in remarkably good condition, after completion of the arthroscopic intervention with chondroplasty I removed the instruments and injected the knee with Marcaine as well as the portals and closed the portals with Monocryl followed by Steri-Strips and sterile gauze. She was awakened and returned to PACU in stable and satisfactory condition. There were no complications and she tolerated the procedure well.

## 2015-05-05 NOTE — Transfer of Care (Signed)
Immediate Anesthesia Transfer of Care Note  Patient: Sara [name]  Procedure(s) Performed: Procedure(s): ARTHROSCOPY KNEE CHONDROPLASTY (Right)  Patient Location: PACU  Anesthesia Type:General  Level of Consciousness: awake, alert  and oriented  Airway & Oxygen Therapy: Patient Spontanous Breathing and Patient connected to nasal cannula oxygen  Post-op Assessment: Report given to RN and Post -op Vital signs reviewed and stable  Post vital signs: Reviewed and stable  Last Vitals:  Filed Vitals:   05/05/15 1216 05/05/15 1534  BP: 142/82   Pulse: 90   Temp: 36.9 C 36.4 C  Resp: 18     Complications: No apparent anesthesia complications

## 2015-05-05 NOTE — Anesthesia Procedure Notes (Signed)
Procedure Name: LMA Insertion Date/Time: 05/05/2015 2:36 PM Performed by: Gwenyth Allegra Pre-anesthesia Checklist: Emergency Drugs available, Timeout performed, Patient identified, Patient being monitored and Suction available Patient Re-evaluated:Patient Re-evaluated prior to inductionOxygen Delivery Method: Circle system utilized Preoxygenation: Pre-oxygenation with 100% oxygen Intubation Type: IV induction LMA: LMA inserted LMA Size: 4.0 Number of attempts: 1 Placement Confirmation: breath sounds checked- equal and bilateral and positive ETCO2 Dental Injury: Teeth and Oropharynx as per pre-operative assessment

## 2015-05-05 NOTE — Progress Notes (Signed)
Patient had, after Dr. Acey Lav left told me she was having some midsternal "chest pressure".   Non radiating, not nauseated, not diaphoretic. Patient thinks its mainly nerves.   I called Dr. Acey Lav, and he did come visit the patient.  12 Lead done (NSR).  To proceed with surgery.

## 2015-05-05 NOTE — Discharge Instructions (Signed)

## 2015-05-05 NOTE — H&P (Signed)
PREOPERATIVE H&P  Chief Complaint: CHONDROMALACIA PATELLA  HPI: Sara [name] is a 40 y.o. female who presents for preoperative history and physical with a diagnosis of CHONDROMALACIA PATELLA. Symptoms are rated as moderate to severe, and have been worsening.  This is significantly impairing activities of daily living.  She has elected for surgical management. She reports the severe pain after a fall January 22, and reports catching and locking. She has tried ibuprofen. This was a slip on ice. She has had an MRI that demonstrates cartilage damage and a small osteochondral lesion on the lateral patella facet. She is miserable and wants something done. She has declined injections because she says she is deathly afraid of needles.  Past Medical History  Diagnosis Date  . 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 Arthritis            Past Surgical History  Procedure Laterality Date  . Cesarean section      X 2  . Tubal ligation  09/16/2003   Social History   Social History  . Marital Status: Married    Spouse Name: N/A  . Number of Children: N/A  . Years of Education: N/A   Occupational History  .      [employer]   Social History Main Topics  . Smoking status: Never Smoker   . Smokeless tobacco: Never Used  . Alcohol Use: No  . Drug Use: No  . Sexual Activity: Yes    Birth Control/ Protection: Surgical   Other Topics Concern  . None   Social History Narrative   Family History  Problem Relation Age of Onset  . Asthma Mother   . Cancer Father   . Diabetes Father   . Hypertension Father   . Kidney disease Father   . Depression Sister   . Kidney disease Maternal Grandmother   . Cancer Paternal Grandmother   . Hyperlipidemia Paternal Grandmother    No Known Allergies Prior to Admission medications   Medication Sig Start Date End Date Taking? Authorizing Provider  ibuprofen (ADVIL,MOTRIN) 200 MG tablet Take 200 mg by mouth every 6 (six) hours as needed for moderate  pain. Reported on 05/04/2015   Yes Historical Provider, MD     Positive ROS: All other systems have been reviewed and were otherwise negative with the exception of those mentioned in the HPI and as above.  Physical Exam: General: Alert, no acute distress Cardiovascular: No pedal edema Respiratory: No cyanosis, no use of accessory musculature GI: No organomegaly, abdomen is soft and non-tender Skin: No lesions in the area of chief complaint Neurologic: Sensation intact distally Psychiatric: Patient is competent for consent with normal mood and affect Lymphatic: No axillary or cervical lymphadenopathy  MUSCULOSKELETAL: Right knee has diffuse pain to palpation, pain over the medial aspect, range of motion 0-80, stable to ligamentous testing.  Assessment: CHONDROMALACIA PATELLA with some degenerative changes in the medial compartment as well.   Plan: Plan for Procedure(s): ARTHROSCOPY KNEE CHONDROPLASTY  The risks benefits and alternatives were discussed with the patient including but not limited to the risks of nonoperative treatment, versus surgical intervention including infection, bleeding, nerve injury,  blood clots, cardiopulmonary complications, morbidity, mortality, among others, and they were willing to proceed.   Eulas Post, MD Cell [phone]   05/05/2015 2:06 PM

## 2015-05-07 NOTE — Anesthesia Postprocedure Evaluation (Signed)
Anesthesia Post Note  Patient: Sara [name]  Procedure(s) Performed: Procedure(s) (LRB): ARTHROSCOPY KNEE CHONDROPLASTY (Right)  Patient location during evaluation: PACU Anesthesia Type: General Level of consciousness: awake and alert Pain management: pain level controlled Vital Signs Assessment: post-procedure vital signs reviewed and stable Respiratory status: spontaneous breathing, nonlabored ventilation, respiratory function stable and patient connected to nasal cannula oxygen Cardiovascular status: blood pressure returned to baseline and stable Postop Assessment: no signs of nausea or vomiting Anesthetic complications: no    Last Vitals:  Filed Vitals:   05/05/15 1645 05/05/15 1654  BP:  140/86  Pulse: 82 83  Temp:    Resp: 19 20    Last Pain:  Filed Vitals:   05/05/15 1658  PainSc: 2                  Phillips Grout

## 2015-05-08 ENCOUNTER — Encounter (HOSPITAL_COMMUNITY): Payer: Self-pay | Admitting: Orthopedic Surgery

## 2015-07-31 DIAGNOSIS — M25562 Pain in left knee: Secondary | ICD-10-CM | POA: Diagnosis not present

## 2015-08-28 DIAGNOSIS — M25562 Pain in left knee: Secondary | ICD-10-CM | POA: Diagnosis not present

## 2015-08-28 DIAGNOSIS — M2241 Chondromalacia patellae, right knee: Secondary | ICD-10-CM | POA: Diagnosis not present

## 2015-09-29 DIAGNOSIS — M2241 Chondromalacia patellae, right knee: Secondary | ICD-10-CM | POA: Diagnosis not present

## 2015-11-01 DIAGNOSIS — M2241 Chondromalacia patellae, right knee: Secondary | ICD-10-CM | POA: Diagnosis not present

## 2016-07-20 ENCOUNTER — Emergency Department (HOSPITAL_BASED_OUTPATIENT_CLINIC_OR_DEPARTMENT_OTHER)
Admission: EM | Admit: 2016-07-20 | Discharge: 2016-07-20 | Disposition: A | Discharge status: Home / Self Care | Payer: BLUE CROSS/BLUE SHIELD | Attending: Emergency Medicine | Admitting: Emergency Medicine

## 2016-07-20 ENCOUNTER — Encounter (HOSPITAL_BASED_OUTPATIENT_CLINIC_OR_DEPARTMENT_OTHER): Payer: Self-pay | Admitting: Emergency Medicine

## 2016-07-20 ENCOUNTER — Emergency Department (HOSPITAL_BASED_OUTPATIENT_CLINIC_OR_DEPARTMENT_OTHER): Payer: BLUE CROSS/BLUE SHIELD

## 2016-07-20 DIAGNOSIS — Y929 Unspecified place or not applicable: Secondary | ICD-10-CM | POA: Insufficient documentation

## 2016-07-20 DIAGNOSIS — Y999 Unspecified external cause status: Secondary | ICD-10-CM | POA: Insufficient documentation

## 2016-07-20 DIAGNOSIS — Y9301 Activity, walking, marching and hiking: Secondary | ICD-10-CM | POA: Insufficient documentation

## 2016-07-20 DIAGNOSIS — M5442 Lumbago with sciatica, left side: Secondary | ICD-10-CM | POA: Insufficient documentation

## 2016-07-20 DIAGNOSIS — M5441 Lumbago with sciatica, right side: Secondary | ICD-10-CM | POA: Insufficient documentation

## 2016-07-20 DIAGNOSIS — X58XXXA Exposure to other specified factors, initial encounter: Secondary | ICD-10-CM | POA: Diagnosis not present

## 2016-07-20 DIAGNOSIS — M5431 Sciatica, right side: Secondary | ICD-10-CM

## 2016-07-20 DIAGNOSIS — R200 Anesthesia of skin: Secondary | ICD-10-CM | POA: Diagnosis not present

## 2016-07-20 DIAGNOSIS — Z79899 Other long term (current) drug therapy: Secondary | ICD-10-CM | POA: Diagnosis not present

## 2016-07-20 DIAGNOSIS — S39012A Strain of muscle, fascia and tendon of lower back, initial encounter: Secondary | ICD-10-CM | POA: Diagnosis not present

## 2016-07-20 DIAGNOSIS — S3992XA Unspecified injury of lower back, initial encounter: Secondary | ICD-10-CM | POA: Diagnosis present

## 2016-07-20 DIAGNOSIS — Z0389 Encounter for observation for other suspected diseases and conditions ruled out: Secondary | ICD-10-CM | POA: Diagnosis not present

## 2016-07-20 DIAGNOSIS — M5432 Sciatica, left side: Secondary | ICD-10-CM

## 2016-07-20 LAB — BASIC METABOLIC PANEL
ANION GAP: 5 (ref 5–15)
BUN: 13 mg/dL (ref 6–20)
CO2: 27 mmol/L (ref 22–32)
Calcium: 9 mg/dL (ref 8.9–10.3)
Chloride: 105 mmol/L (ref 101–111)
Creatinine, Ser: 0.84 mg/dL (ref 0.44–1.00)
GFR calc Af Amer: >60 mL/min (ref >60)
GLUCOSE: 110 mg/dL — AB (ref 65–99)
POTASSIUM: 4.8 mmol/L (ref 3.5–5.1)
Sodium: 137 mmol/L (ref 135–145)

## 2016-07-20 LAB — CBC
HEMATOCRIT: 31.2 % — AB (ref 36.0–46.0)
Hemoglobin: 11.1 g/dL — ABNORMAL LOW (ref 12.0–15.0)
MCH: 22.9 pg — ABNORMAL LOW (ref 26.0–34.0)
MCHC: 35.6 g/dL (ref 30.0–36.0)
MCV: 64.3 fL — ABNORMAL LOW (ref 78.0–100.0)
PLATELETS: 310 10*3/uL (ref 150–400)
RBC: 4.85 MIL/uL (ref 3.87–5.11)
RDW: 16.3 % — ABNORMAL HIGH (ref 11.5–15.5)
WBC: 7.2 10*3/uL (ref 4.0–10.5)

## 2016-07-20 MED ORDER — CYCLOBENZAPRINE HCL 10 MG PO TABS: 10.0000 mg | ORAL_TABLET | Freq: Two times a day (BID) | ORAL | 0 refills | Status: DC | PRN

## 2016-07-20 MED ORDER — NAPROXEN 500 MG PO TABS: 500.0000 mg | ORAL_TABLET | Freq: Two times a day (BID) | ORAL | 0 refills | Status: DC

## 2016-07-20 NOTE — ED Notes (Signed)
Pt on cardiac monitor and auto VS 

## 2016-07-20 NOTE — ED Notes (Signed)
Patient transported to X-ray 

## 2016-07-20 NOTE — ED Triage Notes (Signed)
Lower leg numbness and lower back pain at times since walking in heels about 5 days ago and has a slight throbbing to left chest today.

## 2016-07-20 NOTE — ED Notes (Signed)
ED Provider at bedside. 

## 2016-07-20 NOTE — Discharge Instructions (Signed)
Take medications as prescribed, follow-up with your doctor later this week or early next week for further evaluation as we discussed if the symptoms have not resolved

## 2016-07-20 NOTE — ED Provider Notes (Signed)
[facility] Provider Note   CSN: [id number] Arrival date & time: 07/20/16  1040     History   Chief Complaint Chief Complaint  Patient presents with  . Back Pain    HPI Sara [name] is a 41 y.o. female.  HPI Pt has been having a numbness sensation in her legs bilaterally for the last week.  This all started after a lot of walking one [place] ago [place] a [place].  She has noticed some low back pain in the mornings as well that started a couple of days ago.  No falls.  She is able to walk and her legs do not feel weak.  No difficulty with speech.   Past Medical History:  Diagnosis Date  . Arthritis   . 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 Complication of anesthesia    has never really had any anesthesia  . Severe obesity (BMI >= 40) (HCC) 07/06/2008   Qualifier: Diagnosis of  By: Clelia Croft MD, Kimberlee      Patient Active Problem List   Diagnosis Date Noted  . 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 ECZEMA, ATOPIC 08/30/2009  . DEPRESSION, SEVERE 08/09/2009  . GRIEF REACTION, ACUTE 07/04/2009  . Severe obesity (BMI >= 40) (HCC) 07/06/2008  . ALLERGIC RHINITIS 07/06/2008    Past Surgical History:  Procedure Laterality Date  . CESAREAN SECTION     X 2  . KNEE ARTHROSCOPY Right 05/05/2015   Procedure: ARTHROSCOPY KNEE CHONDROPLASTY;  Surgeon: Teryl Lucy, MD;  Location: [facility] OR;  Service: Orthopedics;  Laterality: Right;  . TUBAL LIGATION  09/16/2003    OB History    Gravida Para Term Preterm AB Living   SAB TAB Ectopic Multiple Live Births     1             Home Medications    Prior to Admission medications   Medication Sig Start Date End Date Taking? Authorizing Provider  cyclobenzaprine (FLEXERIL) 10 MG tablet Take 1 tablet (10 mg total) by mouth 2 (two) times daily as needed for muscle spasms. 07/20/16   Linwood Dibbles, MD  ketorolac (TORADOL) 10 MG tablet Take 1 tablet (10  mg total) by mouth every 8 (eight) hours. 05/05/15   Teryl Lucy, MD  naproxen (NAPROSYN) 500 MG tablet Take 1 tablet (500 mg total) by mouth 2 (two) times daily. 07/20/16   Linwood Dibbles, MD  ondansetron (ZOFRAN) 4 MG tablet Take 1 tablet (4 mg total) by mouth every 8 (eight) hours as needed for nausea or vomiting. 05/05/15   Teryl Lucy, MD  oxyCODONE-acetaminophen (ROXICET) 5-325 MG tablet Take 1-2 tablets by mouth every 6 (six) hours as needed for severe pain. 05/05/15   Teryl Lucy, MD  sennosides-docusate sodium (SENOKOT-S) 8.6-50 MG tablet Take 2 tablets by mouth daily. 05/05/15   Teryl Lucy, MD    Family History Family History  Problem Relation Age of Onset  . Cancer Paternal Grandmother   . Hyperlipidemia Paternal Grandmother   . Asthma Mother   . Cancer Father   . Diabetes Father   . Hypertension Father   . Kidney disease Father   . Depression Sister   . Kidney disease Maternal Grandmother     Social History Social History  Substance Use Topics  . Smoking status: Never Smoker  . Smokeless tobacco: Never Used  . Alcohol use No  Allergies   Patient has no known allergies.   Review of Systems Review of Systems  All other systems reviewed and are negative.    Physical Exam Updated Vital Signs BP 131/80 (BP Location: Right Arm)   Pulse 80   Temp 98.4 F (36.9 C) (Oral)   Resp 18   Ht  (1.702 m)   Wt (!) 149.7 kg   LMP 06/23/2016 (Exact Date)   SpO2 100%   BMI 51.69 kg/m   Physical Exam  Constitutional: She appears well-developed and well-nourished. No distress.  Obese   HENT:  Head: Normocephalic and atraumatic.  Right Ear: External ear normal.  Left Ear: External ear normal.  Eyes: Conjunctivae are normal. Right eye exhibits no discharge. Left eye exhibits no discharge. No scleral icterus.  Neck: Neck supple. No tracheal deviation present.  Cardiovascular: Normal rate, regular rhythm and intact distal pulses.   Pulmonary/Chest: Effort  normal and breath sounds normal. No stridor. No respiratory distress. She has no wheezes. She has no rales.  Abdominal: Soft. Bowel sounds are normal. She exhibits no distension. There is no tenderness. There is no rebound and no guarding.  Musculoskeletal: She exhibits no edema or tenderness.  Neurological: She is alert. She has normal strength. No cranial nerve deficit (no facial droop, extraocular movements intact, no slurred speech) or sensory deficit. She exhibits normal muscle tone. She displays no seizure activity. Coordination normal.  5/5 strength, sensation to light touch intact   Skin: Skin is warm and dry. No rash noted.  Psychiatric: She has a normal mood and affect.  Nursing note and vitals reviewed.    ED Treatments / Results  Labs (all labs ordered are listed, but only abnormal results are displayed) Labs Reviewed  CBC - Abnormal; Notable for the following:       Result Value   Hemoglobin 11.1 (*)    HCT 31.2 (*)    MCV 64.3 (*)    MCH 22.9 (*)    RDW 16.3 (*)    All other components within normal limits  BASIC METABOLIC PANEL - Abnormal; Notable for the following:    Glucose, Bld 110 (*)    All other components within normal limits    EKG  EKG Interpretation  Date/Time:  Saturday July 20 2016 10:57:36 EDT Ventricular Rate:  81 PR Interval:    QRS Duration: 81 QT Interval:  363 QTC Calculation: 422 R Axis:   50 Text Interpretation:  Sinus rhythm Consider left atrial enlargement No significant change since last tracing Confirmed by Terran Hollenkamp  MD-J, Vartan Kerins ([id number]) on 07/20/2016 11:00:07 AM       Radiology Dg Lumbar Spine Complete  Result Date: 07/20/2016 CLINICAL DATA:  Pt with bilateral lower extremity numbness x 5 days, c/o bilateral calf numbness and inner thigh, states this started after walking for a long distance in shoes with a slight heel EXAM: LUMBAR SPINE - COMPLETE 4+ VIEW COMPARISON:  None. FINDINGS: There is no evidence of lumbar spine fracture.  Alignment is normal. Intervertebral disc spaces are maintained. IMPRESSION: Negative. Electronically Signed   By: Elige Ko   On: 07/20/2016 11:43    Procedures Procedures (including critical care time)  Medications Ordered in ED Medications - No data to display   Initial Impression / Assessment and Plan / ED Course  I have reviewed the triage vital signs and the nursing notes.  Pertinent labs & imaging results that were available during my care of the patient were reviewed by me and  considered in my medical decision making (see chart for details).   discussed the findings with the patient. I suspect her symptoms are related to lumbar radiculopathy. She describes some lower back pain and a sensation of numbness in her lower extremities although she does on physical exam have normal strength and sensation to light touch.  Doubt guillian barre or other neurologic emergency based on her exam.  Patient was also concerned about the possibility of a DVT because her father was diagnosed with DVT in the past. Patient has no swelling, no calf tenderness no edema. She does not have any symptoms suggestive DVT. She is also low risk and PERC negative.  We will discharge home with a prescription for Flexeril and Naprosyn. We discussed follow-up with her primary doctor for further evaluation of her symptoms persist  Final Clinical Impressions(s) / ED Diagnoses   Final diagnoses:  Strain of lumbar region, initial encounter  Bilateral sciatica    New Prescriptions New Prescriptions   CYCLOBENZAPRINE (FLEXERIL) 10 MG TABLET    Take 1 tablet (10 mg total) by mouth 2 (two) times daily as needed for muscle spasms.   NAPROXEN (NAPROSYN) 500 MG TABLET    Take 1 tablet (500 mg total) by mouth 2 (two) times daily.     Linwood Dibbles, MD 07/20/16 1224

## 2016-07-20 NOTE — ED Notes (Signed)
Pt refused wheelchair.  

## 2016-07-23 ENCOUNTER — Ambulatory Visit (INDEPENDENT_AMBULATORY_CARE_PROVIDER_SITE_OTHER): Payer: BLUE CROSS/BLUE SHIELD | Admitting: Family Medicine

## 2016-07-23 VITALS — BP 128/64 | HR 80 | Temp 97.8°F | Wt 329.8 lb

## 2016-07-23 DIAGNOSIS — M79605 Pain in left leg: Secondary | ICD-10-CM | POA: Diagnosis not present

## 2016-07-23 DIAGNOSIS — G609 Hereditary and idiopathic neuropathy, unspecified: Secondary | ICD-10-CM | POA: Diagnosis not present

## 2016-07-23 DIAGNOSIS — M79604 Pain in right leg: Secondary | ICD-10-CM

## 2016-07-23 DIAGNOSIS — R200 Anesthesia of skin: Secondary | ICD-10-CM

## 2016-07-23 DIAGNOSIS — D509 Iron deficiency anemia, unspecified: Secondary | ICD-10-CM

## 2016-07-23 HISTORY — DX: Iron deficiency anemia, unspecified: D50.9

## 2016-07-23 HISTORY — DX: Hereditary and idiopathic neuropathy, unspecified: G60.9

## 2016-07-23 HISTORY — DX: Anesthesia of skin: R20.0

## 2016-07-23 LAB — POCT GLYCOSYLATED HEMOGLOBIN (HGB A1C): HEMOGLOBIN A1C: 5

## 2016-07-23 MED ORDER — GABAPENTIN 300 MG PO CAPS: 300.0000 mg | ORAL_CAPSULE | Freq: Every day | ORAL | 3 refills | Status: DC

## 2016-07-23 MED ORDER — FERROUS SULFATE 325 (65 FE) MG PO TABS: 325.0000 mg | ORAL_TABLET | Freq: Every day | ORAL | 3 refills | Status: DC

## 2016-07-23 NOTE — Assessment & Plan Note (Signed)
Symptoms most consistent with peripheral neuropathy (bilateral distribution, neuropathic-type pain). Doubt radicular pathology given her bilateral distribution. Will check TSH, B12, and A1c. Patient also with microcytic anemia on her recent CBC - she may have iron deficiency playing a role. Will start her on iron supplementation today and check iron studies. Will check lower extremity doppler to rule out DVT. Will start gabapentin at night for neuropathic pain. Follow up in 3 weeks with PCP. If above negative, and symptoms not improving, would consider advanced imaging and/or other blood work including ESR/CRP, CK, ANA, RF, etc.

## 2016-07-23 NOTE — Progress Notes (Signed)
   Subjective:  Sara [name] is a 41 y.o. female who presents to the [facility] today with a chief complaint of bilateral leg pain.   HPI:  Bilateral leg pain Symptoms started about 2 weeks ago without any obvious precipitating events or trauma. Patient is concerned because both her father and sister had similar symptoms in the past and were diagnosed with DVTs. Patient went to the ED about a week ago and was diagnosed with a lumbar strain. She has tried using epsom salt soaks which do not help. Pain is there all the time and is described as a tingling and burning sensation. Pain is nonexertional. No back pain. No fevers or chills.   ROS: Per HPI  PMH: Smoking history reviewed.   Objective:  Physical Exam: BP 128/64   Pulse 80   Temp 97.8 F (36.6 C) (Oral)   Wt (!) 329 lb 12.8 oz (149.6 kg)   LMP 06/23/2016 (Exact Date)   SpO2 98%   BMI 51.65 kg/m   Gen: NAD, resting comfortably MSK: -LLE: No deformities. FROM in all fields. Strength 5/5 in all fields. Straight leg raise negative. -RLE: No deformities. FROM in all fields. Strength 5/5 in all fields. Straight leg raise negative. -Back: No deformities. FROM. Nontender to palpation. Skin: warm, dry Neuro: grossly normal, moves all extremities Psych: Normal affect and thought content  Assessment/Plan:  Hereditary and idiopathic peripheral neuropathy Symptoms most consistent with peripheral neuropathy (bilateral distribution, neuropathic-type pain). Doubt radicular pathology given her bilateral distribution. Will check TSH, B12, and A1c. Patient also with microcytic anemia on her recent CBC - she may have iron deficiency playing a role. Will start her on iron supplementation today and check iron studies. Will check lower extremity doppler to rule out DVT. Will start gabapentin at night for neuropathic pain. Follow up in 3 weeks with PCP. If above negative, and symptoms not improving, would consider advanced imaging and/or other blood work  including ESR/CRP, CK, ANA, RF, etc.   Sara [name] M. Jerline Pain, [facility] Resident PGY-3 07/23/2016 5:08 PM

## 2016-07-23 NOTE — Patient Instructions (Signed)
We will check an ultrasound to make sure you dont have a blood clot.  We will check blood work.  I would like for you to start an iron pill.  If your blood work and ultrasound are negative, you may need an MRI.  Come back to see me or Dr Jennette Kettle in a few weeks.  Take care,  Dr Jimmey Ralph

## 2016-07-24 ENCOUNTER — Telehealth: Payer: Self-pay | Admitting: Family Medicine

## 2016-07-24 ENCOUNTER — Telehealth: Payer: Self-pay

## 2016-07-24 ENCOUNTER — Ambulatory Visit (HOSPITAL_COMMUNITY)
Admission: RE | Admit: 2016-07-24 | Discharge: 2016-07-24 | Disposition: A | Discharge status: Home / Self Care | Payer: BLUE CROSS/BLUE SHIELD | Source: Ambulatory Visit | Attending: Family Medicine | Admitting: Family Medicine

## 2016-07-24 DIAGNOSIS — M79604 Pain in right leg: Secondary | ICD-10-CM

## 2016-07-24 DIAGNOSIS — M79605 Pain in left leg: Secondary | ICD-10-CM | POA: Diagnosis not present

## 2016-07-24 LAB — TSH: TSH: 1.19 u[IU]/mL (ref 0.450–4.500)

## 2016-07-24 LAB — FERRITIN: FERRITIN: 66 ng/mL (ref 15–150)

## 2016-07-24 LAB — IRON AND TIBC
IRON: 22 ug/dL — AB (ref 27–159)
Iron Saturation: 6 % — CL (ref 15–55)
TIBC: 353 ug/dL (ref 250–450)
UIBC: 331 ug/dL (ref 131–425)

## 2016-07-24 LAB — VITAMIN B12: Vitamin B-12: 474 pg/mL (ref 232–1245)

## 2016-07-24 NOTE — Telephone Encounter (Signed)
Pt would like someone to call her with lab results. ep °

## 2016-07-24 NOTE — Telephone Encounter (Signed)
[facility] with [facility], Venous study neg.  Britta Mccreedy called, wanted to know if order for MRI could be placed. Dr. Jennette Kettle was consulted, pt told MRI will not be done today. Sunday Spillers, CMA

## 2016-07-24 NOTE — Progress Notes (Signed)
VASCULAR LAB PRELIMINARY  PRELIMINARY  PRELIMINARY  PRELIMINARY  Bilateral lower extremity arterial duplex completed.    Preliminary report:  Bilateral:  No evidence of DVT, superficial thrombosis, or Baker's Cyst.   Sara [name], RVS 07/24/2016, 10:43 AM

## 2016-07-25 ENCOUNTER — Emergency Department (HOSPITAL_COMMUNITY): Payer: BLUE CROSS/BLUE SHIELD

## 2016-07-25 ENCOUNTER — Emergency Department (HOSPITAL_COMMUNITY)
Admission: EM | Admit: 2016-07-25 | Discharge: 2016-07-25 | Disposition: A | Discharge status: Home / Self Care | Payer: BLUE CROSS/BLUE SHIELD | Attending: Emergency Medicine | Admitting: Emergency Medicine

## 2016-07-25 ENCOUNTER — Encounter (HOSPITAL_COMMUNITY): Payer: Self-pay | Admitting: Emergency Medicine

## 2016-07-25 DIAGNOSIS — R0602 Shortness of breath: Secondary | ICD-10-CM | POA: Insufficient documentation

## 2016-07-25 DIAGNOSIS — M79605 Pain in left leg: Secondary | ICD-10-CM | POA: Diagnosis not present

## 2016-07-25 DIAGNOSIS — M79662 Pain in left lower leg: Secondary | ICD-10-CM | POA: Diagnosis not present

## 2016-07-25 DIAGNOSIS — M79661 Pain in right lower leg: Secondary | ICD-10-CM | POA: Diagnosis not present

## 2016-07-25 DIAGNOSIS — R200 Anesthesia of skin: Secondary | ICD-10-CM | POA: Diagnosis not present

## 2016-07-25 DIAGNOSIS — M79604 Pain in right leg: Secondary | ICD-10-CM | POA: Insufficient documentation

## 2016-07-25 LAB — COMPREHENSIVE METABOLIC PANEL
ALT: 24 U/L (ref 14–54)
AST: 20 U/L (ref 15–41)
Albumin: 3.8 g/dL (ref 3.5–5.0)
Alkaline Phosphatase: 79 U/L (ref 38–126)
Anion gap: 8 (ref 5–15)
BILIRUBIN TOTAL: 0.6 mg/dL (ref 0.3–1.2)
BUN: 14 mg/dL (ref 6–20)
CALCIUM: 9.3 mg/dL (ref 8.9–10.3)
CO2: 26 mmol/L (ref 22–32)
CREATININE: 0.99 mg/dL (ref 0.44–1.00)
Chloride: 104 mmol/L (ref 101–111)
GFR calc Af Amer: >60 mL/min (ref >60)
GFR calc non Af Amer: >60 mL/min (ref >60)
Glucose, Bld: 132 mg/dL — ABNORMAL HIGH (ref 65–99)
Potassium: 4.2 mmol/L (ref 3.5–5.1)
Sodium: 138 mmol/L (ref 135–145)
TOTAL PROTEIN: 7.6 g/dL (ref 6.5–8.1)

## 2016-07-25 LAB — CBC WITH DIFFERENTIAL/PLATELET
BASOS ABS: 0 10*3/uL (ref 0.0–0.1)
BASOS PCT: 0 %
EOS ABS: 0.2 10*3/uL (ref 0.0–0.7)
EOS PCT: 2 %
HEMATOCRIT: 33.7 % — AB (ref 36.0–46.0)
HEMOGLOBIN: 11.4 g/dL — AB (ref 12.0–15.0)
Lymphocytes Relative: 33 %
Lymphs Abs: 2.8 10*3/uL (ref 0.7–4.0)
MCH: 22.4 pg — ABNORMAL LOW (ref 26.0–34.0)
MCHC: 33.8 g/dL (ref 30.0–36.0)
MCV: 66.2 fL — AB (ref 78.0–100.0)
MONO ABS: 0.5 10*3/uL (ref 0.1–1.0)
Monocytes Relative: 6 %
Neutro Abs: 5.1 10*3/uL (ref 1.7–7.7)
Neutrophils Relative %: 59 %
Platelets: 354 10*3/uL (ref 150–400)
RBC: 5.09 MIL/uL (ref 3.87–5.11)
RDW: 16.2 % — ABNORMAL HIGH (ref 11.5–15.5)
WBC: 8.6 10*3/uL (ref 4.0–10.5)

## 2016-07-25 LAB — BRAIN NATRIURETIC PEPTIDE: B Natriuretic Peptide: 11 pg/mL (ref 0.0–100.0)

## 2016-07-25 LAB — I-STAT TROPONIN, ED: Troponin i, poc: 0 ng/mL (ref 0.00–0.08)

## 2016-07-25 NOTE — Telephone Encounter (Signed)
LVM for pt to call back to inform her of below. Zimmerman Rumple, Jorma Tassinari D, CMA  

## 2016-07-25 NOTE — Telephone Encounter (Signed)
I looked at her labs and they look OK. She may need a little iron supplementation but we can discuss when I get back in town.. Please let her know the are OK and if she has any further questions, Dr Leveda AnnaHensel is covering my box while I am gone I will be back Monday THANKS! Denny LevySara Zeke Aker

## 2016-07-25 NOTE — Telephone Encounter (Signed)
Will forward to MD. Kinesha Auten,CMA  

## 2016-07-25 NOTE — Telephone Encounter (Signed)
Results have already been released.  Katina Degreealeb M. Jimmey RalphParker, MD [facility] Resident PGY-3 07/25/2016 2:59 PM

## 2016-07-25 NOTE — ED Triage Notes (Signed)
Pt now st's she is having mild pain in chest.  EOG ordered

## 2016-07-25 NOTE — Telephone Encounter (Signed)
Will forward to Dr. Jimmey RalphParker to release patient's results. Glendine Swetz,CMA

## 2016-07-25 NOTE — ED Notes (Signed)
Lab will process BNP as an add on

## 2016-07-25 NOTE — ED Provider Notes (Signed)
[facility] Provider Note   CSN: [id number] Arrival date & time: 07/25/16  1927     History   Chief Complaint Chief Complaint  Patient presents with  . Leg Pain    HPI Sara [name] is a 41 y.o. female.  41 yo F with a chief complaint of bilateral leg pain and swelling. Going on for the past week or so. Patient is very concerned about this and seen her family physician a couple days ago. She had negative DVT study done yesterday. She returns today because her symptoms are not better and she wants to know exactly what's going on with her legs. She also has been had some mild shortness of breath. She was complaining of paresthesias throughout her entire body. Denies fevers or chills. Denies low back pain. Denies loss of bowel or bladder. Has a subjective lower extremity weakness.   The history is provided by the patient.  Leg Pain   This is a new problem. The current episode started 2 days ago. The problem occurs constantly. The problem has not changed since onset.The quality of the pain is described as aching. The pain is at a severity of 7/10. The pain is moderate. She has tried nothing for the symptoms. The treatment provided no relief.    Past Medical History:  Diagnosis Date  . Arthritis   . 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 Complication of anesthesia    has never really had any anesthesia  . Severe obesity (BMI >= 40) (HCC) 07/06/2008   Qualifier: Diagnosis of  By: Clelia CroftShaw MD, Kimberlee      Patient Active Problem List   Diagnosis Date Noted  . 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 ECZEMA, ATOPIC 08/30/2009  . DEPRESSION, SEVERE 08/09/2009  . GRIEF REACTION, ACUTE 07/04/2009  . Severe obesity (BMI >= 40) (HCC) 07/06/2008  . ALLERGIC RHINITIS 07/06/2008    Past Surgical History:  Procedure Laterality Date  . CESAREAN  SECTION     X 2  . KNEE ARTHROSCOPY Right 05/05/2015   Procedure: ARTHROSCOPY KNEE CHONDROPLASTY;  Surgeon: Teryl LucyJoshua Landau, MD;  [facility];  Service: Orthopedics;  Laterality: Right;  . TUBAL LIGATION  09/16/2003    OB History    Gravida Para Term Preterm AB Living   4 3 3   1 3    SAB TAB Ectopic Multiple Live Births     1             Home Medications    Prior to Admission medications   Medication Sig Start Date End Date Taking? Authorizing Provider  cyclobenzaprine (FLEXERIL) 10 MG tablet Take 1 tablet (10 mg total) by mouth 2 (two) times daily as needed for muscle spasms. 07/20/16   Linwood DibblesJon Knapp, MD  ferrous sulfate 325 (65 FE) MG tablet Take 1 tablet (325 mg total) by mouth daily with breakfast. 07/23/16   Ardith Darkaleb M Parker, MD  gabapentin (NEURONTIN) 300 MG capsule Take 1 capsule (300 mg total) by mouth at bedtime. 07/23/16   Ardith Darkaleb M Parker, MD  ketorolac (TORADOL) 10 MG tablet Take 1 tablet (10 mg total) by mouth every 8 (eight) hours. 05/05/15   Teryl LucyJoshua Landau, MD  naproxen (NAPROSYN) 500 MG tablet Take 1 tablet (500 mg total) by mouth 2 (two) times daily. 07/20/16   Linwood DibblesJon Knapp, MD  ondansetron (ZOFRAN) 4 MG tablet Take 1 tablet (4 mg total) by  mouth every 8 (eight) hours as needed for nausea or vomiting. 05/05/15   Teryl Lucy, MD  oxyCODONE-acetaminophen (ROXICET) 5-325 MG tablet Take 1-2 tablets by mouth every 6 (six) hours as needed for severe pain. 05/05/15   Teryl Lucy, MD  sennosides-docusate sodium (SENOKOT-S) 8.6-50 MG tablet Take 2 tablets by mouth daily. 05/05/15   Teryl Lucy, MD    Family History Family History  Problem Relation Age of Onset  . Cancer Paternal Grandmother   . Hyperlipidemia Paternal Grandmother   . Asthma Mother   . Cancer Father   . Diabetes Father   . Hypertension Father   . Kidney disease Father   . Depression Sister   . Kidney disease Maternal Grandmother     Social History Social History  Substance Use Topics  . Smoking status: Never  Smoker  . Smokeless tobacco: Never Used  . Alcohol use No     Allergies   Patient has no known allergies.   Review of Systems Review of Systems  Constitutional: Negative for chills and fever.  HENT: Negative for congestion and rhinorrhea.   Eyes: Negative for redness and visual disturbance.  Respiratory: Positive for shortness of breath. Negative for wheezing.   Cardiovascular: Positive for leg swelling. Negative for chest pain and palpitations.  Gastrointestinal: Negative for nausea and vomiting.  Genitourinary: Negative for dysuria and urgency.  Musculoskeletal: Negative for arthralgias and myalgias.  Skin: Negative for pallor and wound.  Neurological: Negative for dizziness and headaches.     Physical Exam Updated Vital Signs BP 134/89   Pulse 91   Temp 98 F (36.7 C) (Oral)   Resp 19   Ht 5\' 7"  (1.702 m)   Wt (!) 327 lb (148.3 kg)   LMP 07/25/2016 (Exact Date)   SpO2 99%   BMI 51.22 kg/m   Physical Exam  Constitutional: She is oriented to person, place, and time. She appears well-developed and well-nourished. No distress.  HENT:  Head: Normocephalic and atraumatic.  Eyes: EOM are normal. Pupils are equal, round, and reactive to light.  Neck: Normal range of motion. Neck supple.  Cardiovascular: Normal rate and regular rhythm.  Exam reveals no gallop and no friction rub.   No murmur heard. Pulmonary/Chest: Effort normal. She has no wheezes. She has no rales.  Abdominal: Soft. She exhibits no distension and no mass. There is no tenderness. There is no guarding.  Musculoskeletal: She exhibits edema and tenderness.  Subjective swelling to BLE, hyperpigmentation about bilateral legs, chronic per patient.   Reflexes are normal. No clonus. Negative Babinski. Intact pulse motor and sensation bilaterally.  Neurological: She is alert and oriented to person, place, and time.  Skin: Skin is warm and dry. She is not diaphoretic.  Psychiatric: She has a normal mood and  affect. Her behavior is normal.  Nursing note and vitals reviewed.    ED Treatments / Results  Labs (all labs ordered are listed, but only abnormal results are displayed) Labs Reviewed  CBC WITH DIFFERENTIAL/PLATELET - Abnormal; Notable for the following:       Result Value   Hemoglobin 11.4 (*)    HCT 33.7 (*)    MCV 66.2 (*)    MCH 22.4 (*)    RDW 16.2 (*)    All other components within normal limits  COMPREHENSIVE METABOLIC PANEL - Abnormal; Notable for the following:    Glucose, Bld 132 (*)    All other components within normal limits  BRAIN NATRIURETIC PEPTIDE  I-STAT TROPOININ, ED  EKG  EKG Interpretation None       Radiology Dg Chest 2 View  Result Date: 07/25/2016 CLINICAL DATA:  Numbness and tightness in the chest and legs with leg swelling. EXAM: CHEST  2 VIEW COMPARISON:  12/05/2014. FINDINGS: Normal sized heart. Clear lungs. Minimal diffuse peribronchial thickening with mild improvement. Unremarkable bones. IMPRESSION: Minimal bronchitic changes, improved. Electronically Signed   By: Beckie Salts M.D.   On: 07/25/2016 21:21    Procedures Procedures (including critical care time)  Medications Ordered in ED Medications - No data to display   Initial Impression / Assessment and Plan / ED Course  I have reviewed the triage vital signs and the nursing notes.  Pertinent labs & imaging results that were available during my care of the patient were reviewed by me and considered in my medical decision making (see chart for details).     41 yo F With a chief complaint of bilateral lower extremity pain and swelling. Based on history and physical I suspect that this is likely dependent edema. The patient however feels that she needs an MRI of her legs to further evaluate for DVT. I discussed that this is not a normal imaging study that I know of. I discussed that they may want an MRI of her low spine to make sure that the nerves were not compressible with out low  back pain and didn't feel like this was warranted. The family was very upset that I was withholding medical imaging. I discussed with them for over 20 minutes why this was not necessary at this time. I discussed that I was going to get a chest x-ray as well as a BNP to evaluate for very early heart failure.  Reassuring.  D/c home.   11:44 PM:  I have discussed the diagnosis/risks/treatment options with the patient and family and believe the pt to be eligible for discharge home to follow-up with PCP. We also discussed returning to the ED immediately if new or worsening sx occur. We discussed the sx which are most concerning (e.g., sudden worsening pain, fever, inability to tolerate by mouth) that necessitate immediate return. Medications administered to the patient during their visit and any new prescriptions provided to the patient are listed below.  Medications given during this visit Medications - No data to display   The patient appears reasonably screen and/or stabilized for discharge and I doubt any other medical condition or other [facility] requiring further screening, evaluation, or treatment in the ED at this time prior to discharge.    Final Clinical Impressions(s) / ED Diagnoses   Final diagnoses:  Lower extremity pain, bilateral    New Prescriptions Discharge Medication List as of 07/25/2016 10:14 PM       Melene Plan, DO 07/25/16 2344

## 2016-07-25 NOTE — ED Triage Notes (Signed)
Pt to ED with c/o pain in bil legs from top of legs to bottom x's 2 weeks.  [facility] which was neg.  Pt st's legs feel like they are tight.  Pt also c/o chest feeling numb.  Onset today.

## 2016-07-25 NOTE — Telephone Encounter (Signed)
Pt called again about lab results. Pt is worried about them. I spoke with April who said she would talk to Dr. Jimmey RalphParker. Pt is at work so please call [phone]. Thanks. ep

## 2016-07-25 NOTE — Telephone Encounter (Signed)
Pt would like to know why lab results are not available on mychart. ep

## 2016-07-25 NOTE — Discharge Instructions (Signed)
Decrease your salt intake.  Follow up with your PCP as they may decide that you need further outpatient workup.

## 2016-07-26 NOTE — Telephone Encounter (Signed)
Pt went to ED because of her leg pain.  It was suggested she contact her dr and get an MRI.  Please advise.

## 2016-07-27 DIAGNOSIS — M79662 Pain in left lower leg: Secondary | ICD-10-CM | POA: Diagnosis not present

## 2016-07-27 DIAGNOSIS — M79661 Pain in right lower leg: Secondary | ICD-10-CM | POA: Diagnosis not present

## 2016-07-27 DIAGNOSIS — M7989 Other specified soft tissue disorders: Secondary | ICD-10-CM | POA: Diagnosis not present

## 2016-07-28 DIAGNOSIS — M79604 Pain in right leg: Secondary | ICD-10-CM | POA: Diagnosis not present

## 2016-07-28 DIAGNOSIS — M79605 Pain in left leg: Secondary | ICD-10-CM | POA: Diagnosis not present

## 2016-07-29 ENCOUNTER — Ambulatory Visit (INDEPENDENT_AMBULATORY_CARE_PROVIDER_SITE_OTHER): Payer: BLUE CROSS/BLUE SHIELD | Admitting: Family Medicine

## 2016-07-29 ENCOUNTER — Encounter: Payer: Self-pay | Admitting: Family Medicine

## 2016-07-29 VITALS — BP 114/72 | HR 92 | Temp 98.7°F | Ht 67.0 in | Wt 326.2 lb

## 2016-07-29 DIAGNOSIS — G609 Hereditary and idiopathic neuropathy, unspecified: Secondary | ICD-10-CM

## 2016-07-29 NOTE — Telephone Encounter (Signed)
Appointment has been scheduled, patient aware.

## 2016-07-29 NOTE — Progress Notes (Signed)
CHIEF COMPLAINT / HPI:   Numbness and bilateral lower extremities and symmetrical pattern medial/anterior. Numbness associated with pain at times and a tight feeling particularly in her calves. Numbness is been constant. The tightness in the pain is a little bit more intermittent, worse if she stands for long periods of time. The numbness is significant and she is aware of it on at all times would radiate as a scale of 6 out of 10. She has a similar but less severe numb sensation in her anterior chest across her breasts including the nipple area. Both of these areas of numbness started at the same time, approximately 2 weeks ago while [place]. The day before she had a long drive but no other unusual events. She did not had any illness, no fever, no headaches nausea or visual changes. Since the numbness started, it has not resolved or improved.    REVIEW OF SYSTEMS:  See history of present illness. Additional pertinent review of systems:Review of Systems  Constitutional: Negative for activity chang; no  appetite change and no unexpected weight change.  Eyes: Negative for eye pain and no visual disturbance.  Neck: denies neck pain; no swallowing problems CV: No chest pain, no shortness of breath, . No change in exercise tolerance. Her lower extremity skin tight like she has some edema but has not really noticed a lot of increased swelling. Respiratory: Negative for cough or wheezing.  No shortness of breath. Gastrointestinal: Negative for abdominal pain, no diarrhea and no  constipation.  Genitourinary: Negative for decreased urine volume and  no difficulty urinating.  Musculoskeletal: Negative for arthralgias. No muscle weakness. Skin: Negative for rash.  Psychiatric/Behavioral: Negative for behavioral problems; no sleep disturbance and no  agitation.  NEURO: Numbness anterior chest across the breast area including the nipples and symmetrical numbness  medial/anterior thighs and lower legs and down to the dorsum of the foot about mid foot. Her toes do not have any numbness on either side.  PERTINENT  PMH / PSH: I have reviewed the patient's medications, allergies, past medical and surgical history, smoking status.  Pertinent findings that relate to today's visit / issues include: No personal hx DM Married (Derrick) Non smoker FH Mother ESRD secondary to basement membrane glomerulonephritis age 41 FH HTN and obesity  OBJECTIVE:  Vital signs are reviewed.   GEN.: Well-developed obese female no acute distress HEENT: NECK: Full range of motion that is painless. THYROID: No nodularity of the thyroid and is normal in size, nontender. No lymphadenopathy. No JVD. Pupils are equal round reactive to light and accommodation. Extraocular muscles are intact. No nystagmus. Nonicteric. CV: Regular rate and rhythm without murmur LUNGS: Clear to auscultation bilaterally. Normal respiratory effort. Next ABDOMEN: Soft, positive bowel sounds and fine  MSK: She has normal muscle bulk and tone all 4 extremities and it is symmetrical. She rises from a chair easily without assistance she has a normal gait. NEURO: Decreased sensation to soft touch bilaterally symmetrical pattern including: Anterior chest T2- T7 decreased sensation including area to the midclavicular line. In the bilateral legs the distribution on the medial thighs includes portions of L2-3-4. Does not include L5 or S1 nor does it include the L4 portion of the plantar aspect of the feet. Muscle strength L2-3- 4-5 S1-2 is intact and symmetrical.  Deep tendon reflexes at the knee 1-2+ bilaterally symmetrical, I cannot elicit them at the ankle. In the upper extremity elbow and forearm 1+ bilaterally symmetrical.  Chart review: Her recent ultrasound negative for DVT bilateral lower extremities. This was [facility] and [facility] both was negative results. They also did  chest x-ray and blood work. Reviewed and negative. ASSESSMENT / PLAN:  Differential is incredibly broad, includes demyelinating process, infectious process of central nervous system, multiple sclerosis, idiopathic peripheral neuropathy, heavy metals poisoning, metabolic syndrome, vitamin deficiency etc.  Initially I was more concerned for lumbar pathology, central cord compression. What she told me about the anterior chest symptoms, and differential swayed me more toward the mildly process such as  multiple sclerosis.  She's quite concerned area I think the best way to work this up would be to admit her to the hospital for observation status and get MRI head and neck, lumbar spine, blood work. Consider neurology consultation.   She is amenable to this and we will plan admit tomorrow  morning to our inpatient service.  We'll start with MRI with and without contrast brain, cervical spine, thoracic spine, lumbar spine. Not sure how much of this we can get done in a single setting as each of those takes about 45 minutes and am not sure any patient will tolerate more than one or 2 of those in the day. We'll do brain first. We'll coordinate with radiology in the morning. Regarding lab work, B12, CMP, CBC, INR, iron panel, ANA, SED rate, CRP and UA.

## 2016-07-29 NOTE — Telephone Encounter (Signed)
Dear Sara [name] Team Please have her come for an OFF SCHEDULE appt with me this afternoon at 2 pm. I will get someone to put in a template THANKS! Denny LevySara Ohn Bostic

## 2016-07-30 ENCOUNTER — Observation Stay (HOSPITAL_COMMUNITY): Payer: BLUE CROSS/BLUE SHIELD

## 2016-07-30 ENCOUNTER — Observation Stay (HOSPITAL_COMMUNITY)
Admission: AD | Admit: 2016-07-30 | Discharge: 2016-08-02 | Disposition: A | Discharge status: Home / Self Care | Payer: BLUE CROSS/BLUE SHIELD | Source: Ambulatory Visit | Attending: Family Medicine | Admitting: Family Medicine

## 2016-07-30 ENCOUNTER — Encounter (HOSPITAL_COMMUNITY): Payer: Self-pay | Admitting: Family Medicine

## 2016-07-30 DIAGNOSIS — R200 Anesthesia of skin: Secondary | ICD-10-CM | POA: Diagnosis not present

## 2016-07-30 DIAGNOSIS — Z6841 Body Mass Index (BMI) 40.0 and over, adult: Secondary | ICD-10-CM | POA: Insufficient documentation

## 2016-07-30 DIAGNOSIS — M79604 Pain in right leg: Secondary | ICD-10-CM | POA: Diagnosis not present

## 2016-07-30 DIAGNOSIS — D509 Iron deficiency anemia, unspecified: Secondary | ICD-10-CM | POA: Diagnosis not present

## 2016-07-30 DIAGNOSIS — G609 Hereditary and idiopathic neuropathy, unspecified: Secondary | ICD-10-CM | POA: Diagnosis not present

## 2016-07-30 DIAGNOSIS — M79605 Pain in left leg: Secondary | ICD-10-CM | POA: Diagnosis not present

## 2016-07-30 DIAGNOSIS — E669 Obesity, unspecified: Secondary | ICD-10-CM

## 2016-07-30 HISTORY — DX: Anesthesia of skin: R20.0

## 2016-07-30 HISTORY — DX: Iron deficiency anemia, unspecified: D50.9

## 2016-07-30 LAB — CBC WITH DIFFERENTIAL/PLATELET
BASOS ABS: 0 10*3/uL (ref 0.0–0.1)
Basophils Relative: 0 %
EOS ABS: 0.1 10*3/uL (ref 0.0–0.7)
Eosinophils Relative: 2 %
HCT: 33 % — ABNORMAL LOW (ref 36.0–46.0)
HEMOGLOBIN: 11 g/dL — AB (ref 12.0–15.0)
LYMPHS PCT: 45 %
Lymphs Abs: 2.9 10*3/uL (ref 0.7–4.0)
MCH: 22.1 pg — ABNORMAL LOW (ref 26.0–34.0)
MCHC: 33.3 g/dL (ref 30.0–36.0)
MCV: 66.4 fL — ABNORMAL LOW (ref 78.0–100.0)
Monocytes Absolute: 0.3 10*3/uL (ref 0.1–1.0)
Monocytes Relative: 5 %
NEUTROS ABS: 3.2 10*3/uL (ref 1.7–7.7)
NEUTROS PCT: 48 %
Platelets: 334 10*3/uL (ref 150–400)
RBC: 4.97 MIL/uL (ref 3.87–5.11)
RDW: 16.1 % — ABNORMAL HIGH (ref 11.5–15.5)
WBC: 6.5 10*3/uL (ref 4.0–10.5)

## 2016-07-30 LAB — COMPREHENSIVE METABOLIC PANEL
ALK PHOS: 60 U/L (ref 38–126)
ALT: 19 U/L (ref 14–54)
ANION GAP: 8 (ref 5–15)
AST: 17 U/L (ref 15–41)
Albumin: 3.5 g/dL (ref 3.5–5.0)
BILIRUBIN TOTAL: 0.5 mg/dL (ref 0.3–1.2)
BUN: 9 mg/dL (ref 6–20)
CALCIUM: 9.2 mg/dL (ref 8.9–10.3)
CO2: 29 mmol/L (ref 22–32)
Chloride: 105 mmol/L (ref 101–111)
Creatinine, Ser: 0.84 mg/dL (ref 0.44–1.00)
Glucose, Bld: 102 mg/dL — ABNORMAL HIGH (ref 65–99)
Potassium: 3.9 mmol/L (ref 3.5–5.1)
Sodium: 142 mmol/L (ref 135–145)
TOTAL PROTEIN: 7.3 g/dL (ref 6.5–8.1)

## 2016-07-30 LAB — URINALYSIS, ROUTINE W REFLEX MICROSCOPIC
BILIRUBIN URINE: NEGATIVE
Glucose, UA: NEGATIVE mg/dL
Hgb urine dipstick: NEGATIVE
KETONES UR: NEGATIVE mg/dL
LEUKOCYTES UA: NEGATIVE
NITRITE: NEGATIVE
Protein, ur: NEGATIVE mg/dL
SPECIFIC GRAVITY, URINE: 1.018 (ref 1.005–1.030)
pH: 6 (ref 5.0–8.0)

## 2016-07-30 LAB — PROTIME-INR
INR: 1.05
PROTHROMBIN TIME: 13.7 s (ref 11.4–15.2)

## 2016-07-30 LAB — C-REACTIVE PROTEIN: CRP: 3.6 mg/dL — ABNORMAL HIGH (ref <1.0)

## 2016-07-30 LAB — SEDIMENTATION RATE: Sed Rate: 27 mm/hr — ABNORMAL HIGH (ref 0–22)

## 2016-07-30 LAB — PREGNANCY, URINE: Preg Test, Ur: NEGATIVE

## 2016-07-30 MED ORDER — ACETAMINOPHEN 325 MG PO TABS
650.0000 mg | ORAL_TABLET | Freq: Four times a day (QID) | ORAL | Status: DC | PRN
Start: 2016-07-30 — End: 2016-08-02
  Administered 2016-07-30 – 2016-08-01 (×2): 650 mg via ORAL
  Filled 2016-07-30 (×3): qty 2

## 2016-07-30 MED ORDER — ACETAMINOPHEN 650 MG RE SUPP: 650.0000 mg | Freq: Four times a day (QID) | RECTAL | Status: DC | PRN

## 2016-07-30 MED ORDER — LORAZEPAM 2 MG/ML IJ SOLN
1.0000 mg | Freq: Once | INTRAMUSCULAR | Status: AC
  Administered 2016-07-30: 1 mg via INTRAVENOUS
  Filled 2016-07-30: qty 1

## 2016-07-30 MED ORDER — ENOXAPARIN SODIUM 40 MG/0.4ML ~~LOC~~ SOLN: 40.0000 mg | SUBCUTANEOUS | Status: DC

## 2016-07-30 MED ORDER — LORAZEPAM 2 MG/ML IJ SOLN
0.5000 mg | Freq: Once | INTRAMUSCULAR | Status: AC | PRN
  Administered 2016-07-30: 0.5 mg via INTRAVENOUS
  Filled 2016-07-30: qty 1

## 2016-07-30 MED ORDER — VITAMIN C 500 MG PO TABS
250.0000 mg | ORAL_TABLET | Freq: Every day | ORAL | Status: DC
  Administered 2016-07-31 – 2016-08-02 (×3): 250 mg via ORAL
  Filled 2016-07-30 (×3): qty 1

## 2016-07-30 MED ORDER — FERROUS SULFATE 325 (65 FE) MG PO TABS
325.0000 mg | ORAL_TABLET | Freq: Every day | ORAL | Status: DC
  Administered 2016-07-31 – 2016-08-02 (×3): 325 mg via ORAL
  Filled 2016-07-30 (×3): qty 1

## 2016-07-30 MED ORDER — ENOXAPARIN SODIUM 80 MG/0.8ML ~~LOC~~ SOLN
0.5000 mg/kg | SUBCUTANEOUS | Status: DC
  Filled 2016-07-30: qty 0.8

## 2016-07-30 MED ORDER — POLYETHYLENE GLYCOL 3350 17 G PO PACK: 17.0000 g | PACK | Freq: Every day | ORAL | Status: DC | PRN

## 2016-07-30 MED ORDER — GADOBENATE DIMEGLUMINE 529 MG/ML IV SOLN
20.0000 mL | Freq: Once | INTRAVENOUS | Status: AC
  Administered 2016-07-30: 20 mL via INTRAVENOUS

## 2016-07-30 NOTE — H&P (Signed)
[facility] Admission History and Physical Service Pager: [phone]  Patient name: GERALDYN SHAIN Medical record number: [id number] Date of birth: [birth year] Age: 41 y.o. Gender: female  Primary Care Provider: Dickie La, MD Consultants: none Code Status: FULL (discussed on admission)  Chief Complaint: numbness of LE  Assessment and Plan: Claudette Wermuth Hartsough is a 41 y.o. female presenting with numbness and pain of bilateral LE . PMH is significant for severe obesity, Iron deficiency anemia  LE numbness/pain: Had several labs done in office on 07/23/16: B12 474, TSH 1.190, Fe studies (Fe 22, Fe Sat 6%), A1c 5.0.  Recent LE u/s negative for DVT. 5/3 EKG reviewed.  No evidence of ischemia.  5/3 CXR negative for acute processes or nodules.  DDx broad at this time and includes: demyelinating process, infectious process of central nervous system, multiple sclerosis, idiopathic peripheral neuropathy, heavy metals poisoning, metabolic syndrome, vitamin deficiency, Guillane Barre, etc.  However, given her exam, my suspicion for demyelinating disease is higher on the differential. - Direct admission from clinic, Dr Nori Riis - Med surg - VS per floor protocol - MRI w/ w/out contrast of brain and c-spine.  Will need to pursue further imaging pending these studies - Ativan 0.84m IV x1, to be given 20 minutes prior to MRI - CMP, CBC, INR, ANA, ESR, CRP, UA ordered - RPR, Upreg ordered - Consider SPEP, UPEP - Could consider ABIs, EMG, Lumbar puncture (to look for CMV, Lyme,etc)  or Lyme titers (no [place] tick [place] - Consider Neuro consult pending studies  Iron deficiency anemia: 5/1 Fe studies (Fe 22, Fe Sat 6%) - Initiate Ferrous sulfate 3222m - Vitamin C 25070maily to help w/ absorption  FEN/GI: Normal diet, SLIV Prophylaxis: Lovenox sub-q  Disposition: Place in observation for evaluation of bilateral LE weakness.  History of Present  Illness:  DaaBlakley MichnaiCapell a [age]. female presenting with bilateral LE numbness  Patient reports that bilateral LE numbness started [place] 2.[place] about [place] her trip, she started having symptoms.  She initially attributed symptoms to walking around a lot but became concerned when symptoms seemed to get worse.  She notes numbness is present throughout entire legs up to thighs bilaterally.  She also reports chest numbness.  No saddle anesthesia, urinary retention or incontinence, fecal incontinence, preceding injury, weakness, falls.  She reports no back pain.  However, notes that her left lumbar region/ hip are somewhat uncomfortable in the hospital bed.  Denies CP, SOB, nausea, vomiting, rashes, palpitations, diarrhea, recent illness, dizziness, visual disturbance, anxiety.  She reports a family history of unknown thyroid d/o in sister requiring resection of thyroid, grandmother with RA, mother with end stage renal disease secondary to basement membrane GMN.  She denies other autoimmune disorders.  At baseline, she is completely independent in her iADLs and ADLs.  She takes Tylenol prn.  No other medications.   No PMH.   Had c-section and knee arthroscopy in past but no other surgeries.  No neurologic procedures in past.   No ETOH, drugs, tobacco use.  She is married.  Her husband's name is DerMontine Circled she gives permission for him to have updates regarding her medical care.    Review Of Systems: Per HPI with the following additions: none  ROS  Patient Active Problem List   Diagnosis Date Noted  . Bilateral leg numbness  07/30/2016  . 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 ECZEMA, ATOPIC 08/30/2009  . Severe obesity (BMI >= 40) (HCC) 07/06/2008  . ALLERGIC RHINITIS 07/06/2008    Past Medical History: Past Medical History:  Diagnosis Date  . Arthritis   . Chondromalacia  of right patella 04/2015  . 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 Complication of anesthesia    has never really had any anesthesia  . Severe obesity (BMI >= 40) (HCC) 07/06/2008   Qualifier: Diagnosis of  By: Clelia Croft MD, Kimberlee      Past Surgical History: Past Surgical History:  Procedure Laterality Date  . CESAREAN SECTION     X 2  . KNEE ARTHROSCOPY Right 05/05/2015   Procedure: ARTHROSCOPY KNEE CHONDROPLASTY;  Surgeon: Teryl Lucy, MD;  Location: [facility] OR;  Service: Orthopedics;  Laterality: Right;  . TUBAL LIGATION  09/16/2003    Social History: Social History  Substance Use Topics  . Smoking status: Never Smoker  . Smokeless tobacco: Never Used  . Alcohol use No   Additional social history: none  Please also refer to relevant sections of EMR.  Family History: Family History  Problem Relation Age of Onset  . Cancer Paternal Grandmother   . Hyperlipidemia Paternal Grandmother   . Asthma Mother   . Glomerulonephritis Mother 48    Basement membrane glomerulonephritis resulting ESRD/dialysis  . Cancer Father   . Diabetes Father   . Hypertension Father   . Kidney disease Father   . Depression Sister   . Deep vein thrombosis Sister     thigh (halimah)  . Kidney disease Maternal Grandmother     Allergies and Medications: No Known Allergies No current facility-administered medications on file prior to encounter.    Current Outpatient Prescriptions on File Prior to Encounter  Medication Sig Dispense Refill  . naproxen (NAPROSYN) 500 MG tablet Take 1 tablet (500 mg total) by mouth 2 (two) times daily. (Patient not taking: Reported on 07/30/2016) 14 tablet 0    Objective: BP (!) 109/55 (BP Location: Right Arm)   Pulse 72   Temp 98.7 F (37.1 C)   Resp 18   Ht 5\' 7"  (1.702 m)   Wt (!) 331 lb 8 oz (150.4 kg)   LMP 07/25/2016 (Exact Date)   SpO2 96%   BMI 51.92 kg/m  Exam: General: awake, alert, well appearing female, NAD, obese, husband at bedside Eyes: EOMI,  PERRL, left sclera with small area of brown pigment in the 5 o'clock position ENTM: o/p clear, MMM, dentition good Neck: increased girth but no palpable masses or thyroid nodules; no supraclavicular lymph nodes palpable. No cervical LAD Cardiovascular: RRR, no murmurs, +2 pulses, no LE edema Respiratory: CTAB, normal WOB on room air Gastrointestinal: obese, soft, NT/ND MSK: 5/5 UE and LE strength, moves all extremities independently. Has full AROM.  No midline TTP to Cervical, Thoracic or Lumbar spine. Derm: several areas of flat hyperpigmentation along UE and LE ?post inflammatory. Otherwise dry and intact  Neuro: CN 2-12 grossly in tact.  Pressure sensation intact bilateral LE.  Unable to feel light touch throughout entire bilateral LE.  Does not follow dermatomal pattern.  She has light touch sensation to the groin.  There is decreased light touch sensation along the entire anterior chest. Patellar DTRs 1+/4 bilaterally.  No clonus appreciated. Psych: mood stable, speech normal, good eye contact, affect appropriate, AOx3  Labs and Imaging: CBC BMET   Recent Labs Lab 07/25/16 1953  WBC 8.6  HGB 11.4*  HCT 33.7*  PLT 354    Recent Labs Lab 07/25/16 1953  NA 138  K 4.2  CL 104  CO2 26  BUN 14  CREATININE 0.99  GLUCOSE 132*  CALCIUM 9.3     Ronnie Doss M, DO 07/30/2016, 10:34 AM PGY-3, [place] Intern pager: [phone], text pages welcome

## 2016-07-30 NOTE — Progress Notes (Signed)
Patient ready to go to MRI. Per MD order she received Ativan 0.5mg  IV.

## 2016-07-30 NOTE — Progress Notes (Signed)
Updated patient on imaging results and plan to get lumbar MRI tomorrow. She expressed understanding and had no current questions. Visiting with family.

## 2016-07-30 NOTE — Progress Notes (Signed)
MRI called RN on the floor and said that the patient is still anxious and they requested another dose of Ativan. MD was informed. Waiting for new order. Will continue to monitor.

## 2016-07-30 NOTE — Progress Notes (Signed)
Patient direct admit to [phone] via wheelchair; alert and oriented x 4; complaints of pain and numbness on legs and chest; MD informed about patient arriving on the floor. Orient patient to room and unit;  gave patient care guide; instructed how to use the call bell and  fall risk precautions. Will continue to monitor the patient.

## 2016-07-30 NOTE — Progress Notes (Signed)
Patient received an additional Ativan 1 mg IV per MD order. Will continue to monitor.

## 2016-07-31 DIAGNOSIS — Z6841 Body Mass Index (BMI) 40.0 and over, adult: Secondary | ICD-10-CM | POA: Diagnosis not present

## 2016-07-31 DIAGNOSIS — M79605 Pain in left leg: Secondary | ICD-10-CM | POA: Diagnosis not present

## 2016-07-31 DIAGNOSIS — E669 Obesity, unspecified: Secondary | ICD-10-CM | POA: Diagnosis not present

## 2016-07-31 DIAGNOSIS — R200 Anesthesia of skin: Secondary | ICD-10-CM | POA: Diagnosis not present

## 2016-07-31 DIAGNOSIS — D509 Iron deficiency anemia, unspecified: Secondary | ICD-10-CM | POA: Diagnosis not present

## 2016-07-31 DIAGNOSIS — M79604 Pain in right leg: Secondary | ICD-10-CM | POA: Diagnosis not present

## 2016-07-31 DIAGNOSIS — G609 Hereditary and idiopathic neuropathy, unspecified: Secondary | ICD-10-CM | POA: Diagnosis not present

## 2016-07-31 LAB — BASIC METABOLIC PANEL
ANION GAP: 8 (ref 5–15)
BUN: 13 mg/dL (ref 6–20)
CALCIUM: 8.7 mg/dL — AB (ref 8.9–10.3)
CO2: 25 mmol/L (ref 22–32)
Chloride: 103 mmol/L (ref 101–111)
Creatinine, Ser: 0.8 mg/dL (ref 0.44–1.00)
GFR calc Af Amer: >60 mL/min (ref >60)
GFR calc non Af Amer: >60 mL/min (ref >60)
GLUCOSE: 104 mg/dL — AB (ref 65–99)
POTASSIUM: 4.5 mmol/L (ref 3.5–5.1)
Sodium: 136 mmol/L (ref 135–145)

## 2016-07-31 LAB — CBC
HEMATOCRIT: 33 % — AB (ref 36.0–46.0)
Hemoglobin: 11.4 g/dL — ABNORMAL LOW (ref 12.0–15.0)
MCH: 22.7 pg — AB (ref 26.0–34.0)
MCHC: 34.5 g/dL (ref 30.0–36.0)
MCV: 65.6 fL — ABNORMAL LOW (ref 78.0–100.0)
Platelets: 326 10*3/uL (ref 150–400)
RBC: 5.03 MIL/uL (ref 3.87–5.11)
RDW: 16.7 % — ABNORMAL HIGH (ref 11.5–15.5)
WBC: 6.9 10*3/uL (ref 4.0–10.5)

## 2016-07-31 LAB — ANTINUCLEAR ANTIBODIES, IFA: ANTINUCLEAR ANTIBODIES, IFA: NEGATIVE

## 2016-07-31 LAB — HIV ANTIBODY (ROUTINE TESTING W REFLEX): HIV Screen 4th Generation wRfx: NONREACTIVE

## 2016-07-31 LAB — RPR: RPR: NONREACTIVE

## 2016-07-31 MED ORDER — LORAZEPAM 2 MG/ML IJ SOLN
1.0000 mg | Freq: Once | INTRAMUSCULAR | Status: AC
  Administered 2016-08-02: 1 mg via INTRAVENOUS
  Filled 2016-07-31: qty 1

## 2016-07-31 NOTE — Progress Notes (Signed)
Family Medicine Teaching Service Daily Progress Note Intern Pager: [phone]  Patient name: Sara [name] Siciliano Medical record number: [id number] Date of birth: Feb 01, 1976 Age: 41 y.o. Gender: female  Primary Care Provider: Nestor RampNeal, Sara L, MD Consultants: None Code Status: Full  Pt Overview and Major Events to Date:  5/8 - Admit FPTS  Assessment and Plan: Sara [name] Gater is [name] 41 y.o. female presenting with numbness and pain of bilateral LE . PMH is significant for severe obesity, Iron deficiency anemia  LE numbness/pain: Had several labs done in office on 07/23/16:   DDx broad at this time and includes: demyelinating process, infectious process of central nervous system, multiple sclerosis, idiopathic peripheral neuropathy, heavy metals poisoning, metabolic syndrome, vitamin deficiency, Guillane Barre, etc.  However, given her exam, my suspicion for demyelinating disease is higher on the differential. MR head and cervical neck WNL.  - VS per floor protocol - MRI w/ w/out contrast lumbar spine - ANA pending - Consider SPEP, UPEP - Could consider ABIs, EMG, Lumbar puncture (to look for CMV, Lyme,etc)  or Lyme titers (no recollection of tick bite but [place] in [place] recently) - Consider Neuro consult pending studies  Iron deficiency anemia: 5/1 Fe studies (Fe 22, Fe Sat 6%) - Initiate Ferrous sulfate 325mg   - Vitamin C 250mg  daily to help w/ absorption  FEN/GI: Normal diet, SLIV Prophylaxis: Lovenox sub-q  Disposition: home pending workup  Subjective:  Patient did well overnight with no acute events  Objective: Temp:  [98.2 F (36.8 C)-98.7 F (37.1 C)] 98.3 F (36.8 C) (05/09 0605) Pulse Rate:  [72-93] 81 (05/09 0605) Resp:  [17-18] 17 (05/09 0605) BP: (105-110)/(50-64) 105/50 (05/09 0605) SpO2:  [96 %-98 %] 96 % (05/09 0605) Weight:  [330 lb (149.7 kg)-331 lb 8 oz (150.4 kg)] 330 lb (149.7 kg) (05/08 1739) Physical Exam: General: NAD, rests comfortably in bed Cardiovascular: RRR, no  m/r/g  Respiratory: CTA bil/ no W/R/R Abdomen: soft and nontender Extremities: warm and well perfused, no LE edema  Laboratory:  Upreg negative UA Normal RPR nonreactive CRP 3.6 Sed rate 27 ANA pending CMP WNL HIV NR   Outpatient labs B12 474, TSH 1.190, Fe studies (Fe 22, Fe Sat 6%), A1c 5.0.  Recent LE u/s negative for DVT. 5/3 EKG reviewed.  No evidence of ischemia.  5/3 CXR negative for acute processes or nodules.   Recent Labs Lab 07/25/16 1953 07/30/16 1042 07/31/16 0750  WBC 8.6 6.5 6.9  HGB 11.4* 11.0* 11.4*  HCT 33.7* 33.0* 33.0*  PLT 354 334 326    Recent Labs Lab 07/25/16 1953 07/30/16 1042 07/31/16 0750  NA 138 142 136  K 4.2 3.9 4.5  CL 104 105 103  CO2 26 29 25   BUN 14 9 13   CREATININE 0.99 0.84 0.80  CALCIUM 9.3 9.2 8.7*  PROT 7.6 7.3  --   BILITOT 0.6 0.5  --   ALKPHOS 79 60  --   ALT 24 19  --   AST 20 17  --   GLUCOSE 132* 102* 104*    Imaging/Diagnostic Tests: Mr Lodema PilotBrain W Wo Contrast 07/30/2016 IMPRESSION: Normal MRI of the brain and cervical spine. No evidence of demyelinating disease.  Mr Cervical Spine W Wo Contrast 07/30/2016 IMPRESSION: Normal MRI of the brain and cervical spine. No evidence of demyelinating disease.    Howard PouchFeng, Saheed Carrington, MD 07/31/2016, 9:38 AM PGY-1, [facility] FPTS Intern pager: [phone], text pages welcome

## 2016-08-01 DIAGNOSIS — D509 Iron deficiency anemia, unspecified: Secondary | ICD-10-CM | POA: Diagnosis not present

## 2016-08-01 DIAGNOSIS — M79604 Pain in right leg: Secondary | ICD-10-CM | POA: Diagnosis not present

## 2016-08-01 DIAGNOSIS — Z6841 Body Mass Index (BMI) 40.0 and over, adult: Secondary | ICD-10-CM | POA: Diagnosis not present

## 2016-08-01 DIAGNOSIS — E669 Obesity, unspecified: Secondary | ICD-10-CM | POA: Diagnosis not present

## 2016-08-01 DIAGNOSIS — M79605 Pain in left leg: Secondary | ICD-10-CM | POA: Diagnosis not present

## 2016-08-01 DIAGNOSIS — G609 Hereditary and idiopathic neuropathy, unspecified: Secondary | ICD-10-CM | POA: Diagnosis not present

## 2016-08-01 DIAGNOSIS — R200 Anesthesia of skin: Secondary | ICD-10-CM | POA: Diagnosis not present

## 2016-08-01 NOTE — Progress Notes (Signed)
Family Medicine Teaching Service Daily Progress Note Intern Pager: [phone]  Patient name: Sara [name]       Medical record number: [id number] Date of birth: Jul 16, 1975        Age: 41 y.o.    Gender: female  Primary Care Provider: Nestor Ramp, MD Consultants: None Code Status: Full  Pt Overview and Major Events to Date:  5/8 - Admit FPTS  Assessment and Plan: Iriel A Reidis a 42 y.o.femalepresenting with numbness and pain of bilateral LE. PMH is significant for severe obesity, Iron deficiency anemia  LE numbness/pain:Had several labs done in office on 07/23/16:  DDx broad at this time and includes: demyelinating process, infectious process of central nervous system, multiple sclerosis, idiopathic peripheral neuropathy, heavy metals poisoning, metabolic syndrome, vitamin deficiency, Guillane Barre,etc. However, given her exam, my suspicion for demyelinating disease is higher on the differential. MR head and cervical neck WNL.  - VS per floor protocol - MRI w/ w/out contrast lumbar spine (needed 48 hours to get contrast again) - Consider SPEP, UPEP - Could consider ABIs, EMG, Lumbar puncture (to look for CMV, Lyme,etc) or Lyme titers (no recollection of tick bite but was in [place] recently) - Consider Neuro consult pending studies  Iron deficiency anemia:5/1 Fe studies (Fe 22, Fe Sat 6%) - Initiate Ferrous sulfate 325mg   - Vitamin C 250mg  daily to help w/absorption  FEN/GI: Normal diet, SLIV Prophylaxis: Lovenox sub-q  Disposition: home pending workup  Subjective:  Patient did well overnight with no acute events. Still waiting MRI  Objective: Temp:  [98.2 F (36.8 C)-98.7 F (37.1 C)] 98.3 F (36.8 C) (05/09 0605) Pulse Rate:  [72-93] 81 (05/09 0605) Resp:  [17-18] 17 (05/09 0605) BP: (105-110)/(50-64) 105/50 (05/09 0605) SpO2:  [96 %-98 %] 96 % (05/09 0605) Weight:  [330 lb (149.7 kg)-331 lb 8 oz (150.4 kg)] 330 lb (149.7 kg) (05/08 1739) Physical  Exam: General: NAD, rests comfortably in bed Cardiovascular: RRR, no m/r/g           Respiratory: CTA bil/ no W/R/R Abdomen: soft and nontender Extremities: warm and well perfused, no LE edema  Laboratory:  Upreg negative UA Normal RPR nonreactive CRP 3.6 Sed rate 27 ANA negative CMP WNL HIV NR   Outpatient labs B12 474, TSH 1.190, Fe studies (Fe 22, Fe Sat 6%), A1c 5.0. Recent LE u/s negative for DVT. 5/3 EKG reviewed. No evidence of ischemia. 5/3 CXR negative for acute processes or nodules.   Last Labs    Recent Labs Lab 07/25/16 1953 07/30/16 1042 07/31/16 0750  WBC 8.6 6.5 6.9  HGB 11.4* 11.0* 11.4*  HCT 33.7* 33.0* 33.0*  PLT 354 334 326      Last Labs    Recent Labs Lab 07/25/16 1953 07/30/16 1042 07/31/16 0750  NA 138 142 136  K 4.2 3.9 4.5  CL 104 105 103  CO2 26 29 25   BUN 14 9 13   CREATININE 0.99 0.84 0.80  CALCIUM 9.3 9.2 8.7*  PROT 7.6 7.3  --   BILITOT 0.6 0.5  --   ALKPHOS 79 60  --   ALT 24 19  --   AST 20 17  --   GLUCOSE 132* 102* 104*      Imaging/Diagnostic Tests: Mr Lodema Pilot Contrast 07/30/2016 IMPRESSION: Normal MRI of the brain and cervical spine. No evidence of demyelinating disease.  Mr Cervical Spine W Wo Contrast 07/30/2016 IMPRESSION: Normal MRI of the brain and cervical spine. No evidence of  demyelinating disease.    Howard PouchFeng, Brenee Gajda, MD 07/31/2016, 9:38 AM PGY-1, [facility] FPTS Intern pager: [phone], text pages welcome

## 2016-08-02 ENCOUNTER — Observation Stay (HOSPITAL_COMMUNITY): Payer: BLUE CROSS/BLUE SHIELD

## 2016-08-02 DIAGNOSIS — M79605 Pain in left leg: Secondary | ICD-10-CM | POA: Diagnosis not present

## 2016-08-02 DIAGNOSIS — D509 Iron deficiency anemia, unspecified: Secondary | ICD-10-CM | POA: Diagnosis not present

## 2016-08-02 DIAGNOSIS — R200 Anesthesia of skin: Secondary | ICD-10-CM | POA: Diagnosis not present

## 2016-08-02 DIAGNOSIS — Z6841 Body Mass Index (BMI) 40.0 and over, adult: Secondary | ICD-10-CM | POA: Diagnosis not present

## 2016-08-02 DIAGNOSIS — G609 Hereditary and idiopathic neuropathy, unspecified: Secondary | ICD-10-CM | POA: Diagnosis not present

## 2016-08-02 DIAGNOSIS — M79604 Pain in right leg: Secondary | ICD-10-CM | POA: Diagnosis not present

## 2016-08-02 DIAGNOSIS — M5126 Other intervertebral disc displacement, lumbar region: Secondary | ICD-10-CM | POA: Diagnosis not present

## 2016-08-02 MED ORDER — FERROUS SULFATE 325 (65 FE) MG PO TABS: 325.0000 mg | ORAL_TABLET | Freq: Every day | ORAL | 0 refills | Status: DC

## 2016-08-02 MED ORDER — GADOBENATE DIMEGLUMINE 529 MG/ML IV SOLN
20.0000 mL | Freq: Once | INTRAVENOUS | Status: AC | PRN
  Administered 2016-08-02: 20 mL via INTRAVENOUS

## 2016-08-02 MED ORDER — POLYETHYLENE GLYCOL 3350 17 G PO PACK: 17.0000 g | PACK | Freq: Every day | ORAL | 0 refills | Status: DC | PRN

## 2016-08-02 MED ORDER — DULOXETINE HCL 30 MG PO CPEP: 30.0000 mg | ORAL_CAPSULE | Freq: Every day | ORAL | 0 refills | Status: DC

## 2016-08-02 NOTE — Progress Notes (Signed)
Pt picked up for MRI

## 2016-08-02 NOTE — Progress Notes (Signed)
Discharge instructions and medications reviewed with patient and family member. All questions addressed. IV removed. IV clean, dry intact. Patient escorted out by wheelchair  Croix Presley Cleophus MoltG Patino Hernandez, RN

## 2016-08-02 NOTE — Care Management Note (Signed)
Case Management Note  Patient Details  Name: Ok AnisDaaimah A Wernert MRN: [id number] Date of Birth: [birth year]  Subjective/Objective:      Presented with numbness and pain of bilateral LE. PMH is significant for severe obesity, Iron deficiency anemia            PCP: Burnard LeighSarah Neal  Action/Plan: Plan is to d/c to home when medically stable. CM to f/u with disposition needs.  Expected Discharge Date:                  Expected Discharge Plan:  Home/Self Care  In-House Referral:     Discharge planning Services  CM Consult  Post Acute Care Choice:    Choice offered to:     DME Arranged:    DME Agency:     HH Arranged:    HH Agency:     Status of Service:  In process, will continue to follow  If discussed at Long Length of Stay Meetings, dates discussed:    Additional Comments:  Epifanio LeschesCole, Henri Guedes Hudson, RN 08/02/2016, 9:23 AM

## 2016-08-02 NOTE — Progress Notes (Signed)
Family Medicine Teaching Service Daily Progress Note Intern Pager: [phone]  Patient name: Sara [name] Medical record number: [id number] Date of birth: [birth year] Age: 41 y.o. Gender: female  Primary Care Provider: Nestor RampNeal, Sara L, MD Consultants: None Code Status: Full  Pt Overview and Major Events to Date:  5/8 - Admit FPTS  Assessment and Plan: Sara AnisDaaimah A Younis is a 41 y.o. female presenting with numbness and pain of bilateral LE . PMH is significant for severe obesity, Iron deficiency anemia  LE numbness/pain: Had several labs done in office on 07/23/16:   DDx broad at this time and includes: demyelinating process, infectious process of central nervous system, multiple sclerosis, idiopathic peripheral neuropathy, heavy metals poisoning, metabolic syndrome, vitamin deficiency, Guillane Barre, etc.  - VS per floor protocol - Consider SPEP, UPEP - Could consider ABIs, EMG, Lumbar puncture (to look for CMV, Lyme,etc)  or Lyme titers (no recollection of tick bite but was in [place] recently) - Consider Neuro consult pending studies  Iron deficiency anemia: 5/1 Fe studies (Fe 22, Fe Sat 6%) - Initiate Ferrous sulfate 325mg   - Vitamin C 250mg  daily to help w/ absorption  FEN/GI: Normal diet, SLIV Prophylaxis: Lovenox sub-q  Disposition: home pending workup  Subjective:  Patient did well overnight with no acute events  Objective: Temp:  [98 F (36.7 C)-98.7 F (37.1 C)] 98 F (36.7 C) (05/11 0603) Pulse Rate:  [63-94] 93 (05/11 0603) Resp:  [17-18] 17 (05/11 0603) BP: (101-147)/(59-80) 101/59 (05/11 0603) SpO2:  [97 %-100 %] 100 % (05/11 0603) Physical Exam: General: NAD, rests comfortably in bed Cardiovascular: RRR, no m/r/g  Respiratory: CTA bil/ no W/R/R Abdomen: soft and nontender Extremities: warm and well perfused, no LE edema  Laboratory:  Upreg negative UA Normal RPR nonreactive CRP 3.6 Sed rate 27 ANA negative CMP WNL HIV NR   Outpatient labs B12 474, TSH  1.190, Fe studies (Fe 22, Fe Sat 6%), A1c 5.0.  Recent LE u/s negative for DVT. 5/3 EKG reviewed.  No evidence of ischemia.  5/3 CXR negative for acute processes or nodules.   Recent Labs Lab 07/30/16 1042 07/31/16 0750  WBC 6.5 6.9  HGB 11.0* 11.4*  HCT 33.0* 33.0*  PLT 334 326    Recent Labs Lab 07/30/16 1042 07/31/16 0750  NA 142 136  K 3.9 4.5  CL 105 103  CO2 29 25  BUN 9 13  CREATININE 0.84 0.80  CALCIUM 9.2 8.7*  PROT 7.3  --   BILITOT 0.5  --   ALKPHOS 60  --   ALT 19  --   AST 17  --   GLUCOSE 102* 104*    Imaging/Diagnostic Tests: Mr Lodema PilotBrain W Wo Contrast 07/30/2016 IMPRESSION: Normal MRI of the brain and cervical spine. No evidence of demyelinating disease.  Mr Cervical Spine W Wo Contrast 07/30/2016 IMPRESSION: Normal MRI of the brain and cervical spine. No evidence of demyelinating disease.   MR lumbar spine IMPRESSION: 1. No acute osseous abnormality or abnormal enhancement of lumbar spine. 2. No abnormal enhancement of the conus or cauda equina. 3. Small L4-5 central disc protrusion. 4. No significant foraminal narrowing or canal stenosis.  Howard PouchFeng, Keyonte Cookston, MD 08/02/2016, 8:19 AM PGY-1, [facility] FPTS Intern pager: [phone], text pages welcome

## 2016-08-02 NOTE — Discharge Summary (Signed)
Family Medicine [facility] Discharge Summary  Patient name: Sara [name] Carder Medical record number: [id number] Date of birth: March 06, 1976 Age: 41 y.o. Gender: female Date of Admission: 07/30/2016  Date of Discharge: 08/02/2016 Admitting Physician: Nestor RampSara L Neal, MD  Primary Care Provider: Nestor RampNeal, Sara L, MD Consultants: None  Indication for Hospitalization: lower extremity numbness and bilateral breast numbness  Discharge Diagnoses/Problem List:  Patient Active Problem List   Diagnosis Date Noted  . Bilateral leg numbness 07/30/2016  . Iron deficiency anemia   . Class 3 obesity without serious comorbidity with body mass index (BMI) of 50.0 to 59.9 in adult [facility](HCC)   . 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 ECZEMA, ATOPIC 08/30/2009  . Severe obesity (BMI >= 40) (HCC) 07/06/2008  . ALLERGIC RHINITIS 07/06/2008     Disposition: Home   Discharge Condition: Stable  Discharge Exam:  Temp:  [98 F (36.7 C)-98.7 F (37.1 C)] 98 F (36.7 C) (05/11 0603) Pulse Rate:  [63-94] 93 (05/11 0603) Resp:  [17-18] 17 (05/11 0603) BP: (101-147)/(59-80) 101/59 (05/11 0603) SpO2:  [97 %-100 %] 100 % (05/11 0603) Physical Exam: General: NAD, rests comfortably in bed Cardiovascular: RRR, no m/r/g           Respiratory: CTA bil/ no W/R/R Abdomen: soft and nontender Extremities: warm and well perfused, no LE edema   Brief Hospital Course:  Sara [name] Sara [name] 41 y.o.femalepresenting with numbness and pain of bilateral LE. PMH is significant for severe obesity, Iron deficiency anemia  LE numbness/pain:Patient was admitted out of concern by PCP for new diagnosis of MS, and needed workup/imaging. She had several labs done in office on 07/23/16 (listed below). MRI head, C-spine and lumbar spine were completed without any indication of demyelinating disease.   Iron deficiency anemia:5/1 Fe studies (Fe 22, Fe Sat 6%) Initiated  Ferrous sulfate 325mg .   Issues for Follow Up:  1. Please see how the patient is tolerating cymbalta 30 mg QD and titrate as needed for symptoms of lower extremity numbness  Significant Procedures: none  Significant Labs and Imaging:  Upreg negative UA Normal RPR nonreactive CRP 3.6 Sed rate 27 ANA negative CMP WNL HIV NR  Outpatient labs B12 474, TSH 1.190, Fe studies (Fe 22, Fe Sat 6%), A1c 5.0. Recent LE u/s negative for DVT. 5/3 EKG reviewed. No evidence of ischemia. 5/3 CXR negative for acute processes or nodules.  Mr Sara JeanBrain W [name] Contrast 07/30/2016 IMPRESSION: Normal MRI of the Sara and cervical spine. No evidence of demyelinating disease.  Mr Cervical Spine W [name] Contrast 07/30/2016 IMPRESSION: Normal MRI of the Sara and cervical spine. No evidence of demyelinating disease.   MR lumbar spine IMPRESSION: 1. No acute osseous abnormality or abnormal enhancement of lumbar spine. 2. No abnormal enhancement of the conus or cauda equina. 3. Small L4-5 central disc protrusion. 4. No significant foraminal narrowing or canal stenosis.   Recent Labs Lab 07/30/16 1042 07/31/16 0750  WBC 6.5 6.9  HGB 11.0* 11.4*  HCT 33.0* 33.0*  PLT 334 326    Recent Labs Lab 07/30/16 1042 07/31/16 0750  NA 142 136  K 3.9 4.5  CL 105 103  CO2 29 25  GLUCOSE 102* 104*  BUN 9 13  CREATININE 0.84 0.80  CALCIUM 9.2 8.7*  ALKPHOS 60  --   AST 17  --   ALT 19  --   ALBUMIN 3.5  --     Results/Tests Pending at Time  of Discharge: None  Discharge Medications:  Allergies as of 08/02/2016   No Known Allergies     Medication List    TAKE these medications   acetaminophen 500 MG tablet Commonly known as:  TYLENOL Take 1,000 mg by mouth every 6 (six) hours as needed for moderate pain or headache.   DULoxetine 30 MG capsule Commonly known as:  CYMBALTA Take 1 capsule (30 mg total) by mouth daily.   ferrous sulfate 325 (65 FE) MG tablet Take 1 tablet (325 mg total) by  mouth daily with breakfast.   ICY HOT EX Apply 1 application topically as needed (for leg pain).   naproxen 500 MG tablet Commonly known as:  NAPROSYN Take 1 tablet (500 mg total) by mouth 2 (two) times daily.   polyethylene glycol packet Commonly known as:  MIRALAX / GLYCOLAX Take 17 g by mouth daily as needed for mild constipation.       Discharge Instructions: Please refer to Patient Instructions section of EMR for full details.  Patient was counseled important signs and symptoms that should prompt return to medical care, changes in medications, dietary instructions, activity restrictions, and follow up appointments.   Follow-Up Appointments: Follow-up Information    Tillman Sers, DO Follow up on 08/05/2016.   Why:  Please arrive at 9:15 for your 9:30 AM Appointment. Contact information: [address] [place] [address] [phone]           Howard Pouch, MD 08/02/2016, 3:21 PM PGY-1, [facility] Family Medicine

## 2016-08-05 ENCOUNTER — Encounter: Payer: Self-pay | Admitting: Family Medicine

## 2016-08-05 ENCOUNTER — Ambulatory Visit (INDEPENDENT_AMBULATORY_CARE_PROVIDER_SITE_OTHER): Payer: BLUE CROSS/BLUE SHIELD | Admitting: Family Medicine

## 2016-08-05 VITALS — BP 128/68 | HR 80 | Temp 97.8°F | Ht 67.0 in | Wt 326.8 lb

## 2016-08-05 DIAGNOSIS — Z09 Encounter for follow-up examination after completed treatment for conditions other than malignant neoplasm: Secondary | ICD-10-CM

## 2016-08-05 DIAGNOSIS — R200 Anesthesia of skin: Secondary | ICD-10-CM

## 2016-08-05 DIAGNOSIS — D509 Iron deficiency anemia, unspecified: Secondary | ICD-10-CM

## 2016-08-05 MED ORDER — GABAPENTIN 100 MG PO CAPS: 100.0000 mg | ORAL_CAPSULE | Freq: Three times a day (TID) | ORAL | 0 refills | Status: DC

## 2016-08-05 NOTE — Progress Notes (Signed)
    Subjective:    Patient ID: Sara [name], female    DOB: 12/12/1975, 41 y.o.   MRN: [id number]   CC: hosp follow up for LE weakness  Continuing to have episodes of "tightness" from inner thighs that wraps around to back of calves every day. Has pressure and intense pain from this as well as numbness, like pins and needles. Has minimal weakness in legs with these episodes, no falls. Had to leave work last week due to the pain. Has been taking Tylenol and ibuprofen with very minimal relief.   Has not picked up cymbalta. Plans on picking it up today. Hesitant to take it due to side effects and does not want to be on an antidepressant.  Started juice diet with ginger, kale, spinach etc to try to naturally help the tightening in her legs. Has not been taking iron since discharge, trying to get iron through dietary supplements.   Overall confused and concerned. Grateful the "scary" tests were negative. Would like to see specialist.  Denies any bladder, bowel incontinence or retention, no lower back pain.  Review of Systems- see HPI  Objective:  BP 128/68   Pulse 80   Temp 97.8 F (36.6 C) (Oral)   Ht 5\' 7"  (1.702 m)   Wt (!) 326 lb 12.8 oz (148.2 kg)   LMP 07/25/2016 (Exact Date)   SpO2 98%   BMI 51.18 kg/m  Vitals and nursing note reviewed  General: well nourished, in no acute distress Cardiac: RRR, clear S1 and S2, no murmurs, rubs, or gallops Respiratory: clear to auscultation bilaterally, no increased work of breathing Extremities: no edema or cyanosis. Warm, well perfused. 2+ DP pulses bilaterally.  Skin: warm and dry, no rashes noted Neuro: alert and oriented, no focal deficits. Strength 5/5 in LE bilaterally. Sensation in tact.  Assessment & Plan:    Bilateral leg numbness  Persistent, not relieved with tylenol or ibuprofen. Has not taken Cymbalta yet, plans to pick up today. Numbness in odd distribution, L4-L5 anteriorly and S1-S2 posterior calf. Strength in  tact.  -referral made to neurology, patient may benefit from nerve testing -rx given for gabapentin, instructed to take 100 mg as needed for pain up to three times a day. Patient notified this may cause drowsiness -pt to pick up cymbalta but wants to hold off on taking it until she see's how gabapentin works for her pain -return precautions given -follow up with PCP  Iron deficiency anemia  Not currently taking iron supplement, trying to increase dietary iron  -encourage patient to take iron supplement -follow up blood work in 2-3 months    Return in about 1 week (around 08/12/2016).   Dolores PattyAngela Rahmah Mccamy, DO Family Medicine Resident PGY-1

## 2016-08-05 NOTE — Assessment & Plan Note (Signed)
  Persistent, not relieved with tylenol or ibuprofen. Has not taken Cymbalta yet, plans to pick up today. Numbness in odd distribution, L4-L5 anteriorly and S1-S2 posterior calf. Strength in tact.  -referral made to neurology, patient may benefit from nerve testing -rx given for gabapentin, instructed to take 100 mg as needed for pain up to three times a day. Patient notified this may cause drowsiness -pt to pick up cymbalta but wants to hold off on taking it until she see's how gabapentin works for her pain -return precautions given -follow up with PCP

## 2016-08-05 NOTE — Assessment & Plan Note (Signed)
  Not currently taking iron supplement, trying to increase dietary iron  -encourage patient to take iron supplement -follow up blood work in 2-3 months

## 2016-08-05 NOTE — Patient Instructions (Signed)
It was great meeting you today! Please try Gabapentin for your leg pain and follow up with Dr. Jennette KettleNeal next week. You should hear from our referral department about seeing Neurology within a week. If you don't hear from us, please give us a call. Take care! Dolores PattyAngela Riese Hellard, DO   Gabapentin capsules or tablets What is this medicine? GABAPENTIN (GA ba pen tin) is used to treat certain types of nerve pain. This medicine may be used for other purposes; ask your health care provider or pharmacist if you have questions. COMMON BRAND NAME(S): Active-PAC with Gabapentin, Gabarone, Neurontin What should I tell my health care provider before I take this medicine? They need to know if you have any of these conditions: -kidney disease -suicidal thoughts, plans, or attempt; a previous suicide attempt by you or a family member -an unusual or allergic reaction to gabapentin, other medicines, foods, dyes, or preservatives -pregnant or trying to get pregnant -breast-feeding How should I use this medicine? Take this medicine by mouth with a glass of water. Follow the directions on the prescription label. You can take it with or without food. If it upsets your stomach, take it with food.Take your medicine at regular intervals. Do not take it more often than directed. Do not stop taking except on your doctor's advice. If you are directed to break the 600 or 800 mg tablets in half as part of your dose, the extra half tablet should be used for the next dose. If you have not used the extra half tablet within 28 days, it should be thrown away. A special MedGuide will be given to you by the pharmacist with each prescription and refill. Be sure to read this information carefully each time. Talk to your pediatrician regarding the use of this medicine in children. Special care may be needed. Overdosage: If you think you have taken too much of this medicine contact a poison control center or emergency room at once. NOTE:  This medicine is only for you. Do not share this medicine with others. What if I miss a dose? If you miss a dose, take it as soon as you can. If it is almost time for your next dose, take only that dose. Do not take double or extra doses. What may interact with this medicine? Do not take this medicine with any of the following medications: -other gabapentin products This medicine may also interact with the following medications: -alcohol -antacids -antihistamines for allergy, cough and cold -certain medicines for anxiety or sleep -certain medicines for depression or psychotic disturbances -homatropine; hydrocodone -naproxen -narcotic medicines (opiates) for pain -phenothiazines like chlorpromazine, mesoridazine, prochlorperazine, thioridazine This list may not describe all possible interactions. Give your health care provider a list of all the medicines, herbs, non-prescription drugs, or dietary supplements you use. Also tell them if you smoke, drink alcohol, or use illegal drugs. Some items may interact with your medicine. What should I watch for while using this medicine? Visit your doctor or health care professional for regular checks on your progress. You may want to keep a record at home of how you feel your condition is responding to treatment. You may want to share this information with your doctor or health care professional at each visit. You should contact your doctor or health care professional if your seizures get worse or if you have any new types of seizures. Do not stop taking this medicine or any of your seizure medicines unless instructed by your doctor or health care professional.  Stopping your medicine suddenly can increase your seizures or their severity. Wear a medical identification bracelet or chain if you are taking this medicine for seizures, and carry a card that lists all your medications. You may get drowsy, dizzy, or have blurred vision. Do not drive, use machinery,  or do anything that needs mental alertness until you know how this medicine affects you. To reduce dizzy or fainting spells, do not sit or stand up quickly, especially if you are an older patient. Alcohol can increase drowsiness and dizziness. Avoid alcoholic drinks. Your mouth may get dry. Chewing sugarless gum or sucking hard candy, and drinking plenty of water will help. The use of this medicine may increase the chance of suicidal thoughts or actions. Pay special attention to how you are responding while on this medicine. Any worsening of mood, or thoughts of suicide or dying should be reported to your health care professional right away. Women who become pregnant while using this medicine may enroll in the [place] American Antiepileptic Drug Pregnancy Registry by calling [phone]. This registry collects information about the safety of antiepileptic drug use during pregnancy. What side effects may I notice from receiving this medicine? Side effects that you should report to your doctor or health care professional as soon as possible: -allergic reactions like skin rash, itching or hives, swelling of the face, lips, or tongue -worsening of mood, thoughts or actions of suicide or dying Side effects that usually do not require medical attention (report to your doctor or health care professional if they continue or are bothersome): -constipation -difficulty walking or controlling muscle movements -dizziness -nausea -slurred speech -tiredness -tremors -weight gain This list may not describe all possible side effects. Call your doctor for medical advice about side effects. You may report side effects to FDA at 1-800-FDA-1088. Where should I keep my medicine? Keep out of reach of children. This medicine may cause accidental overdose and death if it taken by other adults, children, or pets. Mix any unused medicine with a substance like cat litter or coffee grounds. Then throw the medicine away in a  sealed container like a sealed bag or a coffee can with a lid. Do not use the medicine after the expiration date. Store at room temperature between 15 and 30 degrees C (59 and 86 degrees F). NOTE: This sheet is a summary. It may not cover all possible information. If you have questions about this medicine, talk to your doctor, pharmacist, or health care provider.  2018 Elsevier/Gold Standard (2013-05-07 15:26:50)

## 2016-08-14 ENCOUNTER — Encounter: Payer: Self-pay | Admitting: Family Medicine

## 2016-08-14 ENCOUNTER — Ambulatory Visit (INDEPENDENT_AMBULATORY_CARE_PROVIDER_SITE_OTHER): Payer: BLUE CROSS/BLUE SHIELD | Admitting: Family Medicine

## 2016-08-14 DIAGNOSIS — R200 Anesthesia of skin: Secondary | ICD-10-CM

## 2016-08-15 ENCOUNTER — Encounter: Payer: Self-pay | Admitting: Family Medicine

## 2016-08-15 NOTE — Assessment & Plan Note (Signed)
Complete workup for etiology was unremarkable. No sign of multiple sclerosis. She did have a very small disc bulge at L4-5 periods unclear whether not this could be responsible for this. Alternatively she could have a neuritis. Sounds like her pain has improved with the treatment were currently on so we'll continue that and I'll see her back in 3 months. I did warn her if she has any new or worsening symptoms she needs to let me know.

## 2016-08-15 NOTE — Progress Notes (Signed)
    CHIEF COMPLAINT / HPI:  Follow-up lower extremity numbness and pain. The numbness has continued but the pain is much improved on the gabapentin. She's having a problems with gabapentin.  REVIEW OF SYSTEMS:  No new areas of numbness or pain. No weakness in her extremities. No headache. No new neurological symptoms.  OBJECTIVE:  Vital signs are reviewed.  GEN.: Well-developed overweight female no acute distress NEURO: Still has decreased soft touch sensation in the L3-4-5 area of lower extremity. She has normal strength however. IMAGING: Reviewed her MRI.  ASSESSMENT / PLAN: Please see problem oriented charting for details

## 2016-08-21 ENCOUNTER — Ambulatory Visit: Payer: BLUE CROSS/BLUE SHIELD | Admitting: Family Medicine

## 2016-09-04 ENCOUNTER — Ambulatory Visit (INDEPENDENT_AMBULATORY_CARE_PROVIDER_SITE_OTHER): Payer: BLUE CROSS/BLUE SHIELD | Admitting: Neurology

## 2016-09-04 ENCOUNTER — Encounter: Payer: Self-pay | Admitting: Neurology

## 2016-09-04 DIAGNOSIS — R202 Paresthesia of skin: Secondary | ICD-10-CM | POA: Diagnosis not present

## 2016-09-04 DIAGNOSIS — D649 Anemia, unspecified: Secondary | ICD-10-CM | POA: Diagnosis not present

## 2016-09-04 HISTORY — DX: Paresthesia of skin: R20.2

## 2016-09-04 NOTE — Progress Notes (Signed)
PATIENT: Sara [name] DOB: Feb 19, 1976  Chief Complaint  Patient presents with  . New Patient (Initial Visit)    PCP- Dr. Nori Riis, Referring- Dr. Riccio/ Dr. Nori Riis at PCP,  . Numbness    bilateral numbness, not going away, 2 months, comes and goes  . vision    20/40 bilateral no correction     HISTORICAL  Sara [name] is a 41 years old right-handed female, seen in refer by  her primary care physician Dr. Mallie Mussel for evaluation of numbness, initial evaluation was on September 04 2016.  I reviewed and summarized hospital discharge on Aug 02 2016, she had a history of obesity, was highly functional, working as a Librarian, academic at [employer],  At the end of April 2018, she was out of town, had excessive walking, the next day, she noticed numbness from bilateral inner thigh to bilateral calf, tightness numbness sensation, but she denies saddle area sensory change, she denies lower or upper back pain, no gait abnormality, no bowel and bladder incontinence, no upper extremity involvement.  Her symptoms overall has slight improvement but had persistent since its onset, she described tightness numb sensation involving bilateral lower extremity, bilateral calf feel cold  Laboratory evaluations, out of a hemoglobin of 11.4, decreased MCV 65, MCH 22, elevated RDW 16 point 7, BMP showed glucose 104, UA was negative, RPR, HIV, slight elevated C reactive protein 3.6, ESR 27, normal TSH, vitamin B12 474  I have personally reviewed MRI of the brain May 2018 that was normal, MRI of cervical spine showed loss of thrombosis, mild degenerative changes, but no evidence of foraminal canal stenosis. MRI of lumbar spine showed L4-5 disc degenerative changes, no evidence of canal or foraminal stenosis.   She was given prescription of gabapentin 100 mg, which has helped her paresthesias some, but complains of drowsiness side effect.  REVIEW OF SYSTEMS: Full 14 system review of systems performed and notable only for  swelling in legs, numbness  ALLERGIES: No Known Allergies  HOME MEDICATIONS: Current Outpatient Prescriptions  Medication Sig Dispense Refill  . acetaminophen (TYLENOL) 500 MG tablet Take 1,000 mg by mouth every 6 (six) hours as needed for moderate pain or headache.    . ferrous sulfate 325 (65 FE) MG tablet Take 1 tablet (325 mg total) by mouth daily with breakfast. 30 tablet 0  . gabapentin (NEURONTIN) 100 MG capsule Take 1 capsule (100 mg total) by mouth 3 (three) times daily. 30 capsule 0  . naproxen (NAPROSYN) 500 MG tablet Take 1 tablet (500 mg total) by mouth 2 (two) times daily. 14 tablet 0  . polyethylene glycol (MIRALAX / GLYCOLAX) packet Take 17 g by mouth daily as needed for mild constipation. 14 each 0   No current facility-administered medications for this visit.     PAST MEDICAL HISTORY: Past Medical History:  Diagnosis Date  . Arthritis   . Bilateral leg numbness 07/2016  . 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 Iron deficiency anemia 07/2016  . Severe obesity (BMI >= 40) ([facility]) 07/06/2008   Qualifier: Diagnosis of  By: Brigitte Pulse MD, Kimberlee      PAST SURGICAL HISTORY: Past Surgical History:  Procedure Laterality Date  . CESAREAN SECTION     X 2  . KNEE ARTHROSCOPY Right 05/05/2015   Procedure: ARTHROSCOPY KNEE CHONDROPLASTY;  Surgeon: Marchia Bond, MD;  Location: [facility];  Service: Orthopedics;  Laterality: Right;  . TUBAL LIGATION  09/16/2003    FAMILY  HISTORY: Family History  Problem Relation Age of Onset  . Cancer Paternal Grandmother   . Hyperlipidemia Paternal Grandmother   . Asthma Mother   . Glomerulonephritis Mother 14       Basement membrane glomerulonephritis resulting ESRD/dialysis  . Cancer Father   . Diabetes Father   . Hypertension Father   . Kidney disease Father   . Depression Sister   . Deep vein thrombosis Sister        thigh (halimah)  . Kidney disease Maternal Grandmother     SOCIAL  HISTORY:  Social History   Social History  . Marital status: Married    Spouse name: N/A  . Number of children: 3  . Years of education: N/A   Occupational History  .  [employer]    [employer]   Social History Main Topics  . Smoking status: Never Smoker  . Smokeless tobacco: Never Used  . Alcohol use No  . Drug use: No  . Sexual activity: Yes    Birth control/ protection: Surgical   Other Topics Concern  . Not on file   Social History Narrative  . No narrative on file     PHYSICAL EXAM   Vitals:   09/04/16 0748  BP: 127/83  Pulse: 81  Resp: 20  Weight: (!) 321 lb (145.6 kg)  Height: 5' 7"  (1.702 m)    Not recorded      Body mass index is 50.28 kg/m.  PHYSICAL EXAMNIATION:  Gen: NAD, conversant, well nourised, obese, well groomed                     Cardiovascular: Regular rate rhythm, no peripheral edema, warm, nontender. Eyes: Conjunctivae clear without exudates or hemorrhage Neck: Supple, no carotid bruits. Pulmonary: Clear to auscultation bilaterally   NEUROLOGICAL EXAM:  MENTAL STATUS: Speech:    Speech is normal; fluent and spontaneous with normal comprehension.  Cognition:     Orientation to time, place and person     Normal recent and remote memory     Normal Attention span and concentration     Normal Language, naming, repeating,spontaneous speech     Fund of knowledge   CRANIAL NERVES: CN II: Visual fields are full to confrontation. Fundoscopic exam is normal with sharp discs and no vascular changes. Pupils are round equal and briskly reactive to light. CN III, IV, VI: extraocular movement are normal. No ptosis. CN V: Facial sensation is intact to pinprick in all 3 divisions bilaterally. Corneal responses are intact.  CN VII: Face is symmetric with normal eye closure and smile. CN VIII: Hearing is normal to rubbing fingers CN IX, X: Palate elevates symmetrically. Phonation is normal. CN XI: Head turning and shoulder shrug are  intact CN XII: Tongue is midline with normal movements and no atrophy.  MOTOR: There is no pronator drift of out-stretched arms. Muscle bulk and tone are normal. Muscle strength is normal.  REFLEXES: Reflexes are 2+ and symmetric at the biceps, triceps, knees, and ankles. Plantar responses are flexor.  SENSORY: Intact to light touch, pinprick, positional sensation and vibratory sensation are intact in fingers and toes.  COORDINATION: Rapid alternating movements and fine finger movements are intact. There is no dysmetria on finger-to-nose and heel-knee-shin.    GAIT/STANCE: Posture is normal. Gait is steady with normal steps, base, arm swing, and turning. Heel and toe walking are normal. Tandem gait is normal.  Romberg is absent.   DIAGNOSTIC DATA (LABS, IMAGING, TESTING) - I  reviewed patient records, labs, notes, testing and imaging myself where available.   ASSESSMENT AND PLAN  Sara [name] is a 41 y.o. female   Bilateral lower extremity paresthesia  Potential localization to thoracic spine  Proceed with MRI of thoracic spine  EMG nerve conduction study to rule out peripheral neuropathy Microcytic anemia  Laboratory evaluations, including corpora level,   Marcial Pacas, M.D. Ph.D.  [facility] [address], [place],  [address] Ph: [phone] Fax: [phone]  WK:GSUP, Marzetta Merino, MD

## 2016-09-06 LAB — ANA W/REFLEX: Anti Nuclear Antibody(ANA): NEGATIVE

## 2016-09-06 LAB — COPPER, SERUM: COPPER: 176 ug/dL — AB (ref 72–166)

## 2016-09-06 LAB — CK: Total CK: 158 U/L (ref 24–173)

## 2016-09-06 LAB — IMMUNOFIXATION ELECTROPHORESIS
IGA/IMMUNOGLOBULIN A, SERUM: 391 mg/dL — AB (ref 87–352)
IGM (IMMUNOGLOBULIN M), SRM: 131 mg/dL (ref 26–217)
IgG (Immunoglobin G), Serum: 1505 mg/dL (ref 700–1600)
TOTAL PROTEIN: 7.3 g/dL (ref 6.0–8.5)

## 2016-09-06 LAB — FERRITIN: FERRITIN: 69 ng/mL (ref 15–150)

## 2016-09-06 LAB — IRON AND TIBC
IRON SATURATION: 8 % — AB (ref 15–55)
IRON: 26 ug/dL — AB (ref 27–159)
TIBC: 310 ug/dL (ref 250–450)
UIBC: 284 ug/dL (ref 131–425)

## 2016-09-06 LAB — VITAMIN D 25 HYDROXY (VIT D DEFICIENCY, FRACTURES): Vit D, 25-Hydroxy: 11.2 ng/mL — ABNORMAL LOW (ref 30.0–100.0)

## 2016-09-06 LAB — HGB A1C W/O EAG: Hgb A1c MFr Bld: 4.7 % — ABNORMAL LOW (ref 4.8–5.6)

## 2016-09-06 LAB — B. BURGDORFI ANTIBODIES

## 2016-09-22 ENCOUNTER — Ambulatory Visit
Admission: RE | Admit: 2016-09-22 | Discharge: 2016-09-22 | Disposition: A | Discharge status: Home / Self Care | Payer: BLUE CROSS/BLUE SHIELD | Source: Ambulatory Visit | Attending: Neurology | Admitting: Neurology

## 2016-09-22 DIAGNOSIS — D649 Anemia, unspecified: Secondary | ICD-10-CM

## 2016-09-22 DIAGNOSIS — R202 Paresthesia of skin: Secondary | ICD-10-CM | POA: Diagnosis not present

## 2016-09-23 ENCOUNTER — Other Ambulatory Visit: Payer: Self-pay | Admitting: Student in an Organized Health Care Education/Training Program

## 2016-10-24 ENCOUNTER — Telehealth: Payer: Self-pay | Admitting: Neurology

## 2016-10-24 NOTE — Telephone Encounter (Signed)
Pt called to cancel the NCS and EMG due to feeling better, she wanted Dr Terrace ArabiaYan to be made aware and welcomes a call back if needed.  Call back not requested

## 2016-10-25 ENCOUNTER — Encounter: Payer: BLUE CROSS/BLUE SHIELD | Admitting: Neurology

## 2017-09-15 ENCOUNTER — Telehealth: Payer: Self-pay | Admitting: Family Medicine

## 2017-09-15 NOTE — Telephone Encounter (Signed)
Pt is double booked with her husband at 9:50. If dr Jennette Kettleneal cannot see her please give her a call and reschedule.

## 2017-09-17 ENCOUNTER — Encounter: Payer: Self-pay | Admitting: Family Medicine

## 2017-09-17 ENCOUNTER — Other Ambulatory Visit: Payer: Self-pay

## 2017-09-17 ENCOUNTER — Ambulatory Visit (INDEPENDENT_AMBULATORY_CARE_PROVIDER_SITE_OTHER): Payer: BLUE CROSS/BLUE SHIELD | Admitting: Family Medicine

## 2017-09-17 DIAGNOSIS — M2241 Chondromalacia patellae, right knee: Secondary | ICD-10-CM

## 2017-09-17 MED ORDER — MELOXICAM 15 MG PO TABS: 15.0000 mg | ORAL_TABLET | Freq: Every day | ORAL | 1 refills | Status: DC

## 2017-09-17 NOTE — Patient Instructions (Signed)
I sent in your meloxicam. Let me know if you are not having improvement and we can consider an injection and also possibly repeating your MRI.

## 2017-09-18 NOTE — Assessment & Plan Note (Signed)
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 Discussed options including repeat MRi, corticosteroid injection, oral medicines, Pt. She decided to try meloxicam and f/u if no improvement. Greater than 50% of our 25 minute office visit was spent in counseling and education regarding these issues.

## 2017-09-18 NOTE — Progress Notes (Signed)
    CHIEF COMPLAINT / HPI:  Right knee pain worsening last several  Weeks. Stiff, occasionally feels like it is going to lock or give way. No swelling. No calf pain.  REVIEW OF SYSTEMS: No fever. No leg numbness  PERTINENT  PMH / PSH: I have reviewed the patient's medications, allergies, past medical and surgical history, smoking status and updated in the EMR as appropriate.  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 ligamentously intact. Cannot tolerate Thessaly o rother meniscal testing. Calf is soft  ASSESSMENT / PLAN: Please see problem oriented charting for details

## 2018-01-04 DIAGNOSIS — J029 Acute pharyngitis, unspecified: Secondary | ICD-10-CM | POA: Diagnosis not present

## 2018-01-04 DIAGNOSIS — J039 Acute tonsillitis, unspecified: Secondary | ICD-10-CM | POA: Diagnosis not present

## 2018-04-29 ENCOUNTER — Ambulatory Visit: Payer: BLUE CROSS/BLUE SHIELD | Admitting: Family Medicine

## 2018-05-13 ENCOUNTER — Other Ambulatory Visit: Payer: Self-pay

## 2018-05-13 ENCOUNTER — Encounter: Payer: Self-pay | Admitting: Family Medicine

## 2018-05-13 ENCOUNTER — Ambulatory Visit: Payer: BLUE CROSS/BLUE SHIELD | Admitting: Family Medicine

## 2018-05-13 VITALS — BP 118/88 | HR 97 | Temp 98.4°F | Ht 67.0 in | Wt 308.6 lb

## 2018-05-13 DIAGNOSIS — G8929 Other chronic pain: Secondary | ICD-10-CM

## 2018-05-13 DIAGNOSIS — R079 Chest pain, unspecified: Secondary | ICD-10-CM | POA: Diagnosis not present

## 2018-05-13 DIAGNOSIS — N62 Hypertrophy of breast: Secondary | ICD-10-CM | POA: Diagnosis not present

## 2018-05-13 DIAGNOSIS — M549 Dorsalgia, unspecified: Secondary | ICD-10-CM | POA: Diagnosis not present

## 2018-05-13 NOTE — Patient Instructions (Signed)
I have sent in a referral to both cardiology and plastic surgery.  If you do not hear from them in the next 1 to 2 weeks, please let me know.  I will send you note about your lab work.  Please get back on my schedule in the next 1 to 2 months for complete physical.  Great to see you!

## 2018-05-14 LAB — CMP14+EGFR
A/G RATIO: 1.1 — AB (ref 1.2–2.2)
ALT: 17 IU/L (ref 0–32)
AST: 13 IU/L (ref 0–40)
Albumin: 4 g/dL (ref 3.8–4.8)
Alkaline Phosphatase: 84 IU/L (ref 39–117)
BUN / CREAT RATIO: 27 — AB (ref 9–23)
BUN: 22 mg/dL (ref 6–24)
Bilirubin Total: 0.2 mg/dL (ref 0.0–1.2)
CALCIUM: 9.5 mg/dL (ref 8.7–10.2)
CO2: 23 mmol/L (ref 20–29)
Chloride: 103 mmol/L (ref 96–106)
Creatinine, Ser: 0.83 mg/dL (ref 0.57–1.00)
GFR, EST AFRICAN AMERICAN: 101 mL/min/{1.73_m2} (ref >59)
GFR, EST NON AFRICAN AMERICAN: 87 mL/min/{1.73_m2} (ref >59)
Globulin, Total: 3.5 g/dL (ref 1.5–4.5)
Glucose: 108 mg/dL — ABNORMAL HIGH (ref 65–99)
POTASSIUM: 4.5 mmol/L (ref 3.5–5.2)
Sodium: 142 mmol/L (ref 134–144)
Total Protein: 7.5 g/dL (ref 6.0–8.5)

## 2018-05-14 LAB — CBC
Hematocrit: 36.8 % (ref 34.0–46.6)
Hemoglobin: 11.6 g/dL (ref 11.1–15.9)
MCH: 23.1 pg — AB (ref 26.6–33.0)
MCHC: 31.5 g/dL (ref 31.5–35.7)
MCV: 73 fL — AB (ref 79–97)
PLATELETS: 391 10*3/uL (ref 150–450)
RBC: 5.03 x10E6/uL (ref 3.77–5.28)
RDW: 15.8 % — ABNORMAL HIGH (ref 11.7–15.4)
WBC: 9.2 10*3/uL (ref 3.4–10.8)

## 2018-05-14 LAB — LDL CHOLESTEROL, DIRECT: LDL DIRECT: 145 mg/dL — AB (ref 0–99)

## 2018-05-14 LAB — TSH: TSH: 1.12 u[IU]/mL (ref 0.450–4.500)

## 2018-05-15 ENCOUNTER — Encounter: Payer: Self-pay | Admitting: Family Medicine

## 2018-05-15 DIAGNOSIS — R079 Chest pain, unspecified: Secondary | ICD-10-CM

## 2018-05-15 DIAGNOSIS — G8929 Other chronic pain: Secondary | ICD-10-CM | POA: Insufficient documentation

## 2018-05-15 DIAGNOSIS — N62 Hypertrophy of breast: Secondary | ICD-10-CM | POA: Insufficient documentation

## 2018-05-15 DIAGNOSIS — M549 Dorsalgia, unspecified: Secondary | ICD-10-CM

## 2018-05-15 NOTE — Assessment & Plan Note (Signed)
She feels like this inhibits her ability to do any kind of exercise.  She is continued to have chronic upper back and neck pain and has some divots in her shoulders where her brassiere rubs.  She would like to be evaluated for consideration of breast reduction.  I will put in referral.

## 2018-05-15 NOTE — Progress Notes (Signed)
CHIEF COMPLAINT / HPI: Here for discussion about chest pain and generalized muscle pain. 1.  Chest pain: Has had several episodes of chest pressure and discomfort.  Cannot really isolate whether it is related to stress or exercise.  Does not seem to be related to eating.  Has not awakened her from sleep.  She is not particularly physically active.  No current new stressors.  No history of reflux.  Describes one episode where it lasted for 2 hours and she thought about going to the emergency room.  At that time the pain was located in the left chest, radiated a little bit to her head causing some mild headache.  Describes it as pressure and discomfort 6-8 out of 10.  She took an aspirin and it self resolved.  Has had other similar episodes but never to that intensity.  At the time of this episode she was not doing anything but watching TV.  #2.  Is unhappy with her current weight.  He is thinking about doing the keto diet.  Wants to discuss that.  States she generally feels achy all over much of the time and she thinks this may be related to her lack of activity.  She feels a little hampered by her large size in particular by her large breast size.  Says she cannot really do any kind of activity because her breasts get in the way.  They also cause some pain where her bra rubs on her shoulders.  She has had chronic upper back pain related to this for years and thinks is also starting to bother her neck.  Pain is in the top portion of her trapezius, posterior neck, mid to upper back.  Worse as the day gets longer.  #3.  Preventive medicine issues: She is aware that she is well overdue for her physical and Pap smear and needs to get back in for that.  REVIEW OF SYSTEMS: See HPI.  Additional pertinent review of systems is negative for shortness of breath, no dyspnea on exertion other than baseline which she thinks is related to being out of shape.  No cough.  No lower extremity edema.  No dizziness.  No  visual changes.  PERTINENT  PMH / PSH: I have reviewed the patient's medications, allergies, past medical and surgical history, smoking status and updated in the EMR as appropriate. Non-smoker, no illicits, status post BTL Married to [place] with 3 children   OBJECTIVE: Vital signs reviewed. GENERAL: Well-developed, well-nourished, no acute distress.  Obese CARDIOVASCULAR: Regular rate and rhythm no murmur gallop or rub LUNGS: Clear to auscultation bilaterally, no rales or wheeze. ABDOMEN: Soft positive bowel sounds NEURO: No gross focal neurological deficits. MSK: Movement of extremity x 4.  Tender to palpation over bilateral trapezius, rhomboid area.  Scapular retraction is symmetrical. CHEST: Large pendulous breasts SKIN: Soft tissue deformity top part of her shoulders where her bra strap falls. PSYCH: AxOx4. Good eye contact.. No psychomotor retardation or agitation. Appropriate speech fluency and content. Asks and answers questions appropriately. Mood is congruent. NECK: No lymphadenopathy, no carotid bruits, no thyromegaly.  No JVD VASCULAR: Radial pulses, dorsalis pedis and posterior tibial pulses are 2+ bilaterally symmetrical.    ASSESSMENT / PLAN:  Chest pain Her history has some atypical symptoms and but she has several risk factors.  I think she probably needs a stress test.  I will refer her to cardiology for further work-up and management.  We will get some blood work today.  She needs to get back in for physical in the next few weeks  Macromastia She feels like this inhibits her ability to do any kind of exercise.  She is continued to have chronic upper back and neck pain and has some divots in her shoulders where her brassiere rubs.  She would like to be evaluated for consideration of breast reduction.  I will put in referral.  Chronic upper back pain Referral for consultation regarding evaluation for breast reduction

## 2018-05-15 NOTE — Assessment & Plan Note (Signed)
Referral for consultation regarding evaluation for breast reduction

## 2018-05-15 NOTE — Assessment & Plan Note (Signed)
Her history has some atypical symptoms and but she has several risk factors.  I think she probably needs a stress test.  I will refer her to cardiology for further work-up and management.  We will get some blood work today.  She needs to get back in for physical in the next few weeks

## 2018-05-25 ENCOUNTER — Encounter: Payer: Self-pay | Admitting: Family Medicine

## 2018-06-01 NOTE — Progress Notes (Deleted)
Cardiology Office Note    Date:  06/01/2018   ID:  Sara [name], DOB [birth year], MRN [id number]  PCP:  Nestor Ramp, MD  Cardiologist:  Armanda Magic, MD   No chief complaint on file.   History of Present Illness:  Sara [name] is a 43 y.o. female who is being seen today for the evaluation of chest pain at the request of Nestor Ramp, MD.  This is a pleasant 43 year old female with a history of anemia, bulimia, depression orbit obesity who was recently seen by her PCP for complaints of chest pain.  She has had multiple episodes of chest pain and pressure recently that she thinks may be related to stress but does occur during exercise.  She has not had any episodes during sleep.  There is no history of GERD.  Her episodes can last up to 2 hours at a time.  Her pain is left-sided with slight radiation into her neck and occasionally will get a headache associated with it.  She describes the discomfort as a pressure that is 6-8 out of 10.  It occasionally will resolve with aspirin.  Her chest pain episodes are nonexertional.  Denies any associated shortness of breath, nausea or diaphoresis with the episodes of chest pain.  She is now referred to cardiology for further evaluation with stress testing.  Past Medical History:  Diagnosis Date  . ABDOMINAL PAIN 07/04/2009   Qualifier: Diagnosis of  By: [place] MD, Sarah    . ALLERGIC RHINITIS 07/06/2008   Qualifier: Diagnosis of  By: Clelia Croft MD, Kimberlee    . Anemia 09/04/2016  . Arthritis   . Bilateral leg numbness 07/2016  . Bulimia nervosa 08/22/2009   Qualifier: Diagnosis of  By: [place] MD, Sarah    . 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 Class 3 obesity without serious comorbidity with body mass index (BMI) of 50.0 to 59.9 in adult   . DEPRESSION, SEVERE 08/09/2009   Qualifier: Diagnosis of  By: [place] MD, Sarah    . ECZEMA, ATOPIC 08/30/2009   Qualifier: Diagnosis of  By: [place] MD, Sarah    . GRIEF  REACTION, ACUTE 07/04/2009   Qualifier: Diagnosis of  By: [place] MD, Sarah    . 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 Iron deficiency anemia 07/2016  . KNEE PAIN, RIGHT 11/01/2008   Qualifier: Diagnosis of  By: Darrick Penna MD, KARL    . MUSCLE CRAMPS 07/04/2009   Qualifier: Diagnosis of  By: [place] MD, Sarah    . Pallor 07/17/2009   Qualifier: Diagnosis of  By: [place] MD, Sarah    . Paresthesia 09/04/2016  . Plantar fasciitis, left 09/10/2011  . 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 PRURITUS, EARS 07/06/2008   Qualifier: Diagnosis of  By: Clelia Croft MD, Kimberlee    . Severe obesity (BMI >= 40) (HCC) 07/06/2008   Qualifier: Diagnosis of  By: Clelia Croft MD, Kimberlee    . SUBACROMIAL BURSITIS, RIGHT 07/04/2009   Qualifier: Diagnosis of  By: Rexene Alberts  MD, Aurther Loft      Past Surgical History:  Procedure Laterality Date  . CESAREAN SECTION     X 2  . KNEE ARTHROSCOPY Right 05/05/2015   Procedure: ARTHROSCOPY KNEE CHONDROPLASTY;  Surgeon: Teryl Lucy, MD;  Location: [facility] OR;  Service: Orthopedics;  Laterality: Right;  . TUBAL LIGATION  09/16/2003    Current Medications: No outpatient medications have been  marked as taking for the 06/02/18 encounter (Appointment) with Quintella Reichert, MD.    Allergies:   Patient has no known allergies.   Social History   Socioeconomic History  . Marital status: Married    Spouse name: Derrrick Searfoss  . Number of children: 3  . Years of education: Not on file  . Highest education level: Not on file  Occupational History    Employer: [employer]    Comment: [employer]  Social Needs  . Financial resource strain: Not on file  . Food insecurity:    Worry: Not on file    Inability: Not on file  . Transportation needs:    Medical: Not on file    Non-medical: Not on file  Tobacco Use  . Smoking status: Never Smoker  . Smokeless tobacco: Never Used  Substance and Sexual Activity  . Alcohol use: Never     Frequency: Never  . Drug use: No  . Sexual activity: Yes    Birth control/protection: Surgical  Lifestyle  . Physical activity:    Days per week: 0 days    Minutes per session: Not on file  . Stress: Not on file  Relationships  . Social connections:    Talks on phone: Not on file    Gets together: Not on file    Attends religious service: Not on file    Active member of club or organization: Not on file    Attends meetings of clubs or organizations: Not on file    Relationship status: Not on file  Other Topics Concern  . Not on file  Social History Narrative  . Not on file     Family History:  The patient's family history includes Asthma in her mother; Cancer in her father and paternal grandmother; Deep vein thrombosis in her sister; Depression in her sister; Diabetes in her father; Glomerulonephritis (age of onset: 21) in her mother; Hyperlipidemia in her paternal grandmother; Hypertension in her father; Kidney disease in her father and maternal grandmother.   ROS:   Please see the history of present illness.    ROS All other systems reviewed and are negative.  No flowsheet data found.     PHYSICAL EXAM:   VS:  There were no vitals taken for this visit.   GEN: Well nourished, well developed, in no acute distress  HEENT: normal  Neck: no JVD, carotid bruits, or masses Cardiac: RRR; no murmurs, rubs, or gallops,no edema.  Intact distal pulses bilaterally.  Respiratory:  clear to auscultation bilaterally, normal work of breathing GI: soft, nontender, nondistended, + BS MS: no deformity or atrophy  Skin: warm and dry, no rash Neuro:  Alert and Oriented x 3, Strength and sensation are intact Psych: euthymic mood, full affect  Wt Readings from Last 3 Encounters:  05/13/18 (!) 308 lb 9.6 oz (140 kg)  09/17/17 (!) 333 lb (151 kg)  09/04/16 (!) 321 lb (145.6 kg)      Studies/Labs Reviewed:   EKG:  EKG is ordered today and showed NSR with no ST changes.  Recent  Labs: 05/13/2018: ALT 17; BUN 22; Creatinine, Ser 0.83; Hemoglobin 11.6; Platelets 391; Potassium 4.5; Sodium 142; TSH 1.120   Lipid Panel    Component Value Date/Time   LDLDIRECT 145 (H) 05/13/2018 1010    Additional studies/ records that were reviewed today include:  Office notes from PCP    ASSESSMENT:    1. Chest pain, unspecified type   2. Morbid obesity (HCC)  PLAN:  In order of problems listed above:  1. Chest pain - symptoms are atypical and she does not really have any cardiac risk factors except for her morbid obesity.  She is fairly sedentary.  She is never smoked and she has no family history of CAD.  Have a family history of DVT in her sister.  Her EKG today is nonischemic.  I will get a 2D echocardiogram to assess LV function.  Suspect that her chest pain is noncardiac in origin.  A nuclear stress test would be poor quality given her morbid obesity.  Since her EKG is normal I will get an exercise treadmill test as this will also help to assess her exercise capacity.  She complained to her PCP recently that her large breasts are causing a lot of shoulder and back pain and suspect this may be some of the etiology of her chest pain as well.  This is also prohibiting her from doing any kind of significant exercise.  She has chronic upper back and neck pain and shoulder pain as well.  She is considering breast reduction surgery.  2.  Morbid obesity - again she states that her ability to exercise is limited by her macromastia and she has been referred for possible breast reduction surgery.    Medication Adjustments/Labs and Tests Ordered: Current medicines are reviewed at length with the patient today.  Concerns regarding medicines are outlined above.  Medication changes, Labs and Tests ordered today are listed in the Patient Instructions below.  There are no Patient Instructions on file for this visit.   Signed, Armanda Magic, MD  06/01/2018 8:18 PM    [facility] [address] [place], [place], [place]  [address] Phone: [phone]; Fax: [phone]

## 2018-06-02 ENCOUNTER — Ambulatory Visit: Payer: BLUE CROSS/BLUE SHIELD | Admitting: Cardiology

## 2018-06-09 ENCOUNTER — Telehealth: Payer: Self-pay

## 2018-06-09 NOTE — Telephone Encounter (Signed)
Spoke with patient in regards to rescheduling her office visit due to coronavirus pandemic.  The patient actually called in already canceled her appointment.  She is supposed to see me for chest pain and says that lately she is actually been doing quite well and has not had any further chest pain.  Her PCP at some point just wanted her to be evaluated.  I have told her that we will give her a call to reschedule her to be seen in about 4 weeks.  Please reschedule in 4 weeks from today

## 2018-06-09 NOTE — Telephone Encounter (Signed)
Dr. Mayford Knife left a message for the patient to call back.

## 2018-06-10 ENCOUNTER — Ambulatory Visit: Payer: BLUE CROSS/BLUE SHIELD | Admitting: Cardiology

## 2018-07-07 ENCOUNTER — Telehealth: Payer: Self-pay | Admitting: Family Medicine

## 2018-07-07 MED ORDER — PREDNISONE 20 MG PO TABS: 20.0000 mg | ORAL_TABLET | Freq: Every day | ORAL | 0 refills | Status: DC

## 2018-07-07 NOTE — Telephone Encounter (Signed)
Called and spoke with patient about scheduling a virtual visit with Dr Mayford Knife on 07/09/2018 at 11:30am. Patient stated that she is not having any symptoms/complaints right now and feels fine. She does not want to schedule virtual visit and be charged. Patient would like to have an office visit when possible. I explained to call the office with any symptoms/complaints, patient stated her understanding and thanked me for calling.

## 2018-07-10 NOTE — Telephone Encounter (Signed)
Poison ivy dermatitis

## 2018-07-22 ENCOUNTER — Ambulatory Visit: Payer: BLUE CROSS/BLUE SHIELD | Admitting: Family Medicine

## 2018-08-12 ENCOUNTER — Ambulatory Visit: Payer: BLUE CROSS/BLUE SHIELD | Admitting: Family Medicine

## 2018-08-12 ENCOUNTER — Encounter: Payer: Self-pay | Admitting: Family Medicine

## 2018-08-12 ENCOUNTER — Other Ambulatory Visit: Payer: Self-pay

## 2018-08-12 VITALS — BP 112/82 | HR 91 | Wt 330.5 lb

## 2018-08-12 DIAGNOSIS — R252 Cramp and spasm: Secondary | ICD-10-CM

## 2018-08-12 DIAGNOSIS — R1013 Epigastric pain: Secondary | ICD-10-CM

## 2018-08-12 DIAGNOSIS — F439 Reaction to severe stress, unspecified: Secondary | ICD-10-CM | POA: Diagnosis not present

## 2018-08-12 MED ORDER — CYCLOBENZAPRINE HCL 5 MG PO TABS: ORAL_TABLET | ORAL | 1 refills | Status: DC

## 2018-08-12 MED ORDER — PANTOPRAZOLE SODIUM 40 MG PO TBEC: 40.0000 mg | DELAYED_RELEASE_TABLET | Freq: Every day | ORAL | 0 refills | Status: DC

## 2018-08-12 NOTE — Patient Instructions (Signed)
Let me see you back in 3-4 weeks!

## 2018-08-13 NOTE — Progress Notes (Addendum)
    CHIEF COMPLAINT / HPI: Midepigastric abdominal pain over the last couple of weeks.  Intermittent.  Can occur with eating or after periods of relative fasting.  Sharp, 6 out of 10.  Does not radiate to the back.  Can last a few seconds to a few minutes.  Can occur multiple times a day or skip days. #2.  Stress: She feels quite stressed at work.  She thinks this may be what is causing her abdominal pain.  She is having difficulty sleeping at night.  Feeling a lot of anxiety in the morning on the way to work. 3.  Leg cramps occurring multiple times a day.  They also occur at night and wake her up.  Can be cramping in the thigh or the calf, either leg.  She wants to have them in her right hand.  REVIEW OF SYSTEMS: Denies suicidal or homicidal ideation.  No unusual weight change except some gradual weight gain.  See HPI.  PERTINENT  PMH / PSH: I have reviewed the patient's medications, allergies, past medical and surgical history, smoking status and updated in the EMR as appropriate.   OBJECTIVE:  Vital signs reviewed. GENERAL: Well-developed, well-nourished, no acute distress. CARDIOVASCULAR: Regular rate and rhythm no murmur gallop or rub LUNGS: Clear to auscultation bilaterally, no rales or wheeze. ABDOMEN: Soft positive bowel sounds NEURO: No gross focal neurological deficits. MSK: Movement of extremity x 4.  Calves bilaterally are soft. GAIT: Normal.    ASSESSMENT / PLAN:  #1 stressors 2.  Epigastric pain 3.  Muscle cramps  I think this all probably relates back to stress.  She would like to take a week or so to get herself back together.  Feels like her lack of sleep is really affecting her performance at work.  Epigastric pain may be related to increased reflux.  Muscle cramps have a single etiology I see nothing worrisome about these.  They could be related to stress and lack of sleep as well.  I will write her out of work 2 weeks starting today and return to work on June 4.   She will bring paperwork to our office.  I will also give her somecyclobenzaprine to use at night as needed.  Start her on proton pump inhibitor for 1 month and have her follow-up 4 weeks to see how she does with that.

## 2018-08-25 ENCOUNTER — Telehealth: Payer: Self-pay | Admitting: Family Medicine

## 2018-08-25 NOTE — Telephone Encounter (Signed)
Wants to extend her FMLA until end of first week in June. I am [facility] but have not received the paperwork yet. She also texted me a picture of her lower legs that she said were swollen. She wanted to take on of her Dad's lazix pills. I tole her I would rather she be seen. needs eval, possibly labs. She can drop the paperwork off then. In this week Denny Levy

## 2018-08-27 ENCOUNTER — Other Ambulatory Visit: Payer: Self-pay

## 2018-08-27 ENCOUNTER — Encounter: Payer: Self-pay | Admitting: Family Medicine

## 2018-08-27 ENCOUNTER — Ambulatory Visit (INDEPENDENT_AMBULATORY_CARE_PROVIDER_SITE_OTHER): Payer: BC Managed Care – PPO | Admitting: Family Medicine

## 2018-08-27 DIAGNOSIS — R609 Edema, unspecified: Secondary | ICD-10-CM | POA: Diagnosis not present

## 2018-08-27 DIAGNOSIS — R0600 Dyspnea, unspecified: Secondary | ICD-10-CM

## 2018-08-27 DIAGNOSIS — R0609 Other forms of dyspnea: Secondary | ICD-10-CM

## 2018-08-27 DIAGNOSIS — R252 Cramp and spasm: Secondary | ICD-10-CM | POA: Diagnosis not present

## 2018-08-27 DIAGNOSIS — I872 Venous insufficiency (chronic) (peripheral): Secondary | ICD-10-CM | POA: Insufficient documentation

## 2018-08-27 DIAGNOSIS — D509 Iron deficiency anemia, unspecified: Secondary | ICD-10-CM | POA: Diagnosis not present

## 2018-08-27 LAB — POCT GLYCOSYLATED HEMOGLOBIN (HGB A1C): Hemoglobin A1C: 5.2 % (ref 4.0–5.6)

## 2018-08-27 MED ORDER — FUROSEMIDE 20 MG PO TABS: 20.0000 mg | ORAL_TABLET | Freq: Every day | ORAL | 3 refills | Status: DC

## 2018-08-27 NOTE — Assessment & Plan Note (Signed)
Large differential includes liver, kidney and heart.  Most likely dx is chronic venous insufficiency.

## 2018-08-27 NOTE — Patient Instructions (Addendum)
You have a lot going on. I would like you to come back to see me or Dr. Jennette Kettle in 2 weeks to try to put these problems in a cohesive story. I will call with the lab test results. Please try to tonic (quinine) water trick for cramping. Also do daily or twice a day stretching.   I sent in a prescription for a low dose of lasix/furosemide to decrease the fluid.  It should help regardless of what is causing the fluid retention. Increase your potassium intake banannas and orange juice.  Lasix makes your body lose potassium

## 2018-08-27 NOTE — Assessment & Plan Note (Signed)
Long differential.  Might be related to edema.  With normal heart and lung exam, suspect more related to obesity and deconditioning.

## 2018-08-27 NOTE — Progress Notes (Signed)
Established Patient Office Visit  Subjective:  Patient ID: Sara [name], female    DOB: 05/15/1975  Age: 43 y.o. MRN: [id number]  CC:  Chief Complaint  Patient presents with  . Leg Swelling    with spasms    HPI Sara [name] presents for leg swelling, spasms and numbness.  Issues: 1. Bilateral leg swelling.  Appears to be a chronic condition.  Did have venous dopplers in 2018 to R/O DVT.  No hx of heart, kidney or liver problems.  No prior DVT.  Recent BMP is normal.  No recent liver testing.  Never echo.  Does have DOE. 2. Chronic leg cramping.  Possibly related to swelling.  Again worse.  04/2018 had normal K+.  No mag studies recently 3. Prior iron deficiency anemia.  Off iron.  No recent studies.  Last CBC nl HGB but decreased indices.  No recent iron studies. 4. Morbid obesity.  States that she is exercising less than prior to [place].  C/O DOE.   5. Social: Apparently has talked to PCP, Nori Riis, about FMLA.  Paperwork just faxed.  Wants to make sure it is completed.  She has questions about timing of return to work. 6. HPDP.  With her morbid obesity, no recent A1C.  Also, no recent lipids.  Past Medical History:  Diagnosis Date  . ABDOMINAL PAIN 07/04/2009   Qualifier: Diagnosis of  By: Martinique MD, Sarah    . ALLERGIC RHINITIS 07/06/2008   Qualifier: Diagnosis of  By: Brigitte Pulse MD, Kimberlee    . Anemia 09/04/2016  . Arthritis   . Bilateral leg numbness 07/2016  . Bulimia nervosa 08/22/2009   Qualifier: Diagnosis of  By: Martinique MD, Sarah    . 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 Class 3 obesity without serious comorbidity with body mass index (BMI) of 50.0 to 59.9 in adult   . DEPRESSION, SEVERE 08/09/2009   Qualifier: Diagnosis of  By: Martinique MD, Sarah    . ECZEMA, ATOPIC 08/30/2009   Qualifier: Diagnosis of  By: Martinique MD, Sarah    . GRIEF REACTION, ACUTE 07/04/2009   Qualifier: Diagnosis of  By: Martinique MD, Sarah    . Hereditary and  idiopathic peripheral neuropathy 07/23/2016  . Iron deficiency anemia 07/2016  . KNEE PAIN, RIGHT 11/01/2008   Qualifier: Diagnosis of  By: Oneida Alar MD, KARL    . MUSCLE CRAMPS 07/04/2009   Qualifier: Diagnosis of  By: Martinique MD, Sarah    . Pallor 07/17/2009   Qualifier: Diagnosis of  By: Martinique MD, Sarah    . Paresthesia 09/04/2016  . Plantar fasciitis, left 09/10/2011  . 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 PRURITUS, EARS 07/06/2008   Qualifier: Diagnosis of  By: Brigitte Pulse MD, Kimberlee    . Severe obesity (BMI >= 40) ([place]) 07/06/2008   Qualifier: Diagnosis of  By: Brigitte Pulse MD, Kimberlee    . SUBACROMIAL BURSITIS, RIGHT 07/04/2009   Qualifier: Diagnosis of  By: Sarita Haver  MD, Coralyn Mark      Past Surgical History:  Procedure Laterality Date  . CESAREAN SECTION     X 2  . KNEE ARTHROSCOPY Right 05/05/2015   Procedure: ARTHROSCOPY KNEE CHONDROPLASTY;  Surgeon: Marchia Bond, MD;  Location: [facility];  Service: Orthopedics;  Laterality: Right;  . TUBAL LIGATION  09/16/2003    Family History  Problem Relation Age of Onset  . Cancer Paternal Grandmother   . Hyperlipidemia  Paternal Grandmother   . Asthma Mother   . Glomerulonephritis Mother 74       Basement membrane glomerulonephritis resulting ESRD/dialysis  . Cancer Father   . Diabetes Father   . Hypertension Father   . Kidney disease Father   . Depression Sister   . Deep vein thrombosis Sister        thigh (halimah)  . Kidney disease Maternal Grandmother     Social History   Socioeconomic History  . Marital status: Married    Spouse name: Derrrick Buckalew  . Number of children: 3  . Years of education: Not on file  . Highest education level: Not on file  Occupational History    Employer: [employer]    Comment: [employer]  Social Needs  . Financial resource strain: Not on file  . Food insecurity:    Worry: Not on file    Inability: Not on file  . Transportation needs:    Medical: Not on file     Non-medical: Not on file  Tobacco Use  . Smoking status: Never Smoker  . Smokeless tobacco: Never Used  Substance and Sexual Activity  . Alcohol use: Never    Frequency: Never  . Drug use: No  . Sexual activity: Yes    Birth control/protection: Surgical  Lifestyle  . Physical activity:    Days per week: 0 days    Minutes per session: Not on file  . Stress: Not on file  Relationships  . Social connections:    Talks on phone: Not on file    Gets together: Not on file    Attends religious service: Not on file    Active member of club or organization: Not on file    Attends meetings of clubs or organizations: Not on file    Relationship status: Not on file  . Intimate partner violence:    Fear of current or ex partner: Not on file    Emotionally abused: Not on file    Physically abused: Not on file    Forced sexual activity: Not on file  Other Topics Concern  . Not on file  Social History Narrative  . Not on file    Outpatient Medications Prior to Visit  Medication Sig Dispense Refill  . acetaminophen (TYLENOL) 500 MG tablet Take 1,000 mg by mouth every 6 (six) hours as needed for moderate pain or headache.    . cyclobenzaprine (FLEXERIL) 5 MG tablet Take one or one half tab at bedtime prn muscle spasms in legs 30 tablet 1  . ferrous sulfate 325 (65 FE) MG tablet TAKE 1 TABLET BY MOUTH EVERY DAY WITH BREAKFAST 30 tablet 0  . pantoprazole (PROTONIX) 40 MG tablet Take 1 tablet (40 mg total) by mouth daily. 30 tablet 0  . predniSONE (DELTASONE) 20 MG tablet Take 1 tablet (20 mg total) by mouth daily with breakfast. 10 tablet 0   No facility-administered medications prior to visit.     No Known Allergies  ROS Review of Systems    Objective:    Physical Exam  BP 114/72   Pulse 95   Wt (!) 329 lb 9.6 oz (149.5 kg)   LMP 08/12/2018   SpO2 96%   BMI 51.62 kg/m  Wt Readings from Last 3 Encounters:  08/27/18 (!) 329 lb 9.6 oz (149.5 kg)  08/12/18 (!) 330 lb 8 oz  (149.9 kg)  05/13/18 (!) 308 lb 9.6 oz (140 kg)  Lungs clear, no basilar rales Cardiac RRR  without m or g Abd no hepatomegally Legs show bilateral 2+ edema.  Normal pedal pulses. Normal sensation.  Health Maintenance Due  Topic Date Due  . TETANUS/TDAP  08/12/1994  . PAP SMEAR-Modifier  08/11/1996    There are no preventive care reminders to display for this patient.  Lab Results  Component Value Date   TSH 1.120 05/13/2018   Lab Results  Component Value Date   WBC 9.2 05/13/2018   HGB 11.6 05/13/2018   HCT 36.8 05/13/2018   MCV 73 (L) 05/13/2018   PLT 391 05/13/2018   Lab Results  Component Value Date   NA 142 05/13/2018   K 4.5 05/13/2018   CO2 23 05/13/2018   GLUCOSE 108 (H) 05/13/2018   BUN 22 05/13/2018   CREATININE 0.83 05/13/2018   BILITOT 0.2 05/13/2018   ALKPHOS 84 05/13/2018   AST 13 05/13/2018   ALT 17 05/13/2018   PROT 7.5 05/13/2018   ALBUMIN 4.0 05/13/2018   CALCIUM 9.5 05/13/2018   ANIONGAP 8 07/31/2016   No results found for: CHOL No results found for: HDL No results found for: LDLCALC No results found for: TRIG No results found for: CHOLHDL Lab Results  Component Value Date   HGBA1C 5.2 08/27/2018      Assessment & Plan:   Problem List Items Addressed This Visit    Muscle cramping   Relevant Orders   Magnesium   Morbid obesity ([place])   Relevant Orders   Lipid panel   POCT glycosylated hemoglobin (Hb A1C) (Completed)   Iron deficiency anemia   Relevant Orders   Iron and TIBC   Edema    Large differential includes liver, kidney and heart.  Most likely dx is chronic venous insufficiency.      Relevant Medications   furosemide (LASIX) 20 MG tablet   Other Relevant Orders   CMP14+EGFR   Dyspnea   Relevant Orders   Brain natriuretic peptide      Meds ordered this encounter  Medications  . furosemide (LASIX) 20 MG tablet    Sig: Take 1 tablet (20 mg total) by mouth daily.    Dispense:  30 tablet    Refill:  3     Follow-up: No follow-ups on file.    Zenia Resides, MD

## 2018-08-27 NOTE — Assessment & Plan Note (Signed)
Likely idiopathic.  Will check mag and K+.  Recommend stretching and tonic water.

## 2018-08-27 NOTE — Assessment & Plan Note (Signed)
At the heart of several of her complaints.  Recommend increased activity.

## 2018-08-27 NOTE — Assessment & Plan Note (Signed)
Recheck iron studies.  Iron deficiency might contribute to some of her sx.

## 2018-08-29 LAB — LIPID PANEL
Chol/HDL Ratio: 3.3 ratio (ref 0.0–4.4)
Cholesterol, Total: 231 mg/dL — ABNORMAL HIGH (ref 100–199)
HDL: 70 mg/dL (ref >39)
LDL Calculated: 152 mg/dL — ABNORMAL HIGH (ref 0–99)
Triglycerides: 47 mg/dL (ref 0–149)
VLDL Cholesterol Cal: 9 mg/dL (ref 5–40)

## 2018-08-29 LAB — CMP14+EGFR
ALT: 16 IU/L (ref 0–32)
AST: 12 IU/L (ref 0–40)
Albumin/Globulin Ratio: 1.4 (ref 1.2–2.2)
Albumin: 4.5 g/dL (ref 3.8–4.8)
Alkaline Phosphatase: 77 IU/L (ref 39–117)
BUN/Creatinine Ratio: 25 — ABNORMAL HIGH (ref 9–23)
BUN: 19 mg/dL (ref 6–24)
Bilirubin Total: 0.4 mg/dL (ref 0.0–1.2)
CO2: 22 mmol/L (ref 20–29)
Calcium: 9.6 mg/dL (ref 8.7–10.2)
Chloride: 102 mmol/L (ref 96–106)
Creatinine, Ser: 0.77 mg/dL (ref 0.57–1.00)
GFR calc Af Amer: 109 mL/min/{1.73_m2} (ref >59)
GFR calc non Af Amer: 95 mL/min/{1.73_m2} (ref >59)
Globulin, Total: 3.3 g/dL (ref 1.5–4.5)
Glucose: 102 mg/dL — ABNORMAL HIGH (ref 65–99)
Potassium: 4.2 mmol/L (ref 3.5–5.2)
Sodium: 139 mmol/L (ref 134–144)
Total Protein: 7.8 g/dL (ref 6.0–8.5)

## 2018-08-29 LAB — BRAIN NATRIURETIC PEPTIDE: BNP: <2.5 pg/mL (ref 0.0–100.0)

## 2018-08-29 LAB — IRON AND TIBC
Iron Saturation: 12 % — ABNORMAL LOW (ref 15–55)
Iron: 48 ug/dL (ref 27–159)
Total Iron Binding Capacity: 401 ug/dL (ref 250–450)
UIBC: 353 ug/dL (ref 131–425)

## 2018-08-29 LAB — MAGNESIUM: Magnesium: 2 mg/dL (ref 1.6–2.3)

## 2018-09-05 ENCOUNTER — Other Ambulatory Visit: Payer: Self-pay | Admitting: Family Medicine

## 2018-09-10 ENCOUNTER — Other Ambulatory Visit: Payer: Self-pay

## 2018-09-10 ENCOUNTER — Ambulatory Visit: Payer: BC Managed Care – PPO | Admitting: Family Medicine

## 2018-09-10 ENCOUNTER — Encounter: Payer: Self-pay | Admitting: Family Medicine

## 2018-09-10 DIAGNOSIS — R252 Cramp and spasm: Secondary | ICD-10-CM | POA: Diagnosis not present

## 2018-09-10 DIAGNOSIS — I872 Venous insufficiency (chronic) (peripheral): Secondary | ICD-10-CM | POA: Diagnosis not present

## 2018-09-10 NOTE — Assessment & Plan Note (Signed)
Mixed picture.  Less leg edema plus wt gain.  Reported good response to lasix.  Will do wt based dosing of lasix.

## 2018-09-10 NOTE — Progress Notes (Signed)
Established Patient Office Visit  Subjective:  Patient ID: Sara [name], female    DOB: 06-09-1975  Age: 43 y.o. MRN: [id number]  CC:  Chief Complaint  Patient presents with  . Leg Swelling    HPI Sara [name] presents for FU leg swelling.  By exclusion, diagnosis is chronic venous insufficiency.  She has been taking her lasix and responding well.  She has also been elevating her legs.  She is premenstrual now and has some abd bloating.    Still with leg cramping but less  Interested in bariatric surgery.  BMI is > 50.  I believe she is an excellent candidate.  Referral entered.    Past Medical History:  Diagnosis Date  . ABDOMINAL PAIN 07/04/2009   Qualifier: Diagnosis of  By: Swaziland[place] MD, Sarah    . ALLERGIC RHINITIS 07/06/2008   Qualifier: Diagnosis of  By: Clelia CroftShaw MD, Kimberlee    . Anemia 09/04/2016  . Arthritis   . Bilateral leg numbness 07/2016  . Bulimia nervosa 08/22/2009   Qualifier: Diagnosis of  By: [place]az[place]andJordan MD, Sarah    . 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 Class 3 obesity without serious comorbidity with body mass index (BMI) of 50.0 to 59.9 in adult   . DEPRESSION, SEVERE 08/09/2009   Qualifier: Diagnosis of  By: [place]azilandJordan MD, Sarah    . ECZEMA, ATOPIC 08/30/2009   Qualifier: Diagnosis of  By: [place]azilandJordan MD, Sarah    . GRIEF REACTION, ACUTE 07/04/2009   Qualifier: Diagnosis of  By: SwazilandJordan MD, Sarah    . 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 Iron deficiency anemia 07/2016  . KNEE PAIN, RIGHT 11/01/2008   Qualifier: Diagnosis of  By: Darrick PennaFIELDS MD, KARL    . MUSCLE CRAMPS 07/04/2009   Qualifier: Diagnosis of  By: SwazilandJordan MD, Sarah    . Pallor 07/17/2009   Qualifier: Diagnosis of  By: [place]azilandJordan MD, Sarah    . Paresthesia 09/04/2016  . Plantar fasciitis, left 09/10/2011  . 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 PRURITUS, EARS 07/06/2008   Qualifier: Diagnosis of  By: Clelia CroftShaw MD,  Kimberlee    . Severe obesity (BMI >= 40) ([facility]) 07/06/2008   Qualifier: Diagnosis of  By: Clelia CroftShaw MD, Kimberlee    . SUBACROMIAL BURSITIS, RIGHT 07/04/2009   Qualifier: Diagnosis of  By: Rexene AlbertsEverhart  MD, Aurther Lofterry      Past Surgical History:  Procedure Laterality Date  . CESAREAN SECTION     X 2  . KNEE ARTHROSCOPY Right 05/05/2015   Procedure: ARTHROSCOPY KNEE CHONDROPLASTY;  Surgeon: Teryl LucyJoshua Landau, MD;  [facility];  Service: Orthopedics;  Laterality: Right;  . TUBAL LIGATION  09/16/2003    Family History  Problem Relation Age of Onset  . Cancer Paternal Grandmother   . Hyperlipidemia Paternal Grandmother   . Asthma Mother   . Glomerulonephritis Mother [id number]       Basement membrane glomerulonephritis resulting ESRD/dialysis  . Cancer Father   . Diabetes Father   . Hypertension Father   . Kidney disease Father   . Depression Sister   . Deep vein thrombosis Sister        thigh (halimah)  . Kidney disease Maternal Grandmother     Social History   Socioeconomic History  . Marital status: Married    Spouse name: Derrrick Catano  . Number of children: 3  . Years of education: Not  on file  . Highest education level: Not on file  Occupational History    Employer: [employer]    Comment: [employer]  Social Needs  . Financial resource strain: Not on file  . Food insecurity    Worry: Not on file    Inability: Not on file  . Transportation needs    Medical: Not on file    Non-medical: Not on file  Tobacco Use  . Smoking status: Never Smoker  . Smokeless tobacco: Never Used  Substance and Sexual Activity  . Alcohol use: Never    Frequency: Never  . Drug use: No  . Sexual activity: Yes    Birth control/protection: Surgical  Lifestyle  . Physical activity    Days per week: 0 days    Minutes per session: Not on file  . Stress: Not on file  Relationships  . Social Herbalist on phone: Not on file    Gets together: Not on file    Attends religious service: Not on  file    Active member of club or organization: Not on file    Attends meetings of clubs or organizations: Not on file    Relationship status: Not on file  . Intimate partner violence    Fear of current or ex partner: Not on file    Emotionally abused: Not on file    Physically abused: Not on file    Forced sexual activity: Not on file  Other Topics Concern  . Not on file  Social History Narrative  . Not on file    Outpatient Medications Prior to Visit  Medication Sig Dispense Refill  . acetaminophen (TYLENOL) 500 MG tablet Take 1,000 mg by mouth every 6 (six) hours as needed for moderate pain or headache.    . cyclobenzaprine (FLEXERIL) 5 MG tablet Take one or one half tab at bedtime prn muscle spasms in legs 30 tablet 1  . ferrous sulfate 325 (65 FE) MG tablet TAKE 1 TABLET BY MOUTH EVERY DAY WITH BREAKFAST 30 tablet 0  . furosemide (LASIX) 20 MG tablet Take 1 tablet (20 mg total) by mouth daily. 30 tablet 3  . pantoprazole (PROTONIX) 40 MG tablet TAKE 1 TABLET BY MOUTH EVERY DAY 30 tablet 0  . predniSONE (DELTASONE) 20 MG tablet Take 1 tablet (20 mg total) by mouth daily with breakfast. 10 tablet 0   No facility-administered medications prior to visit.     No Known Allergies  ROS Review of Systems    Objective:    Physical Exam  BP 122/68   Pulse 95   Wt (!) 336 lb (152.4 kg)   LMP 09/04/2018 (Exact Date)   SpO2 99%   BMI 52.63 kg/m  Wt Readings from Last 3 Encounters:  09/10/18 (!) 336 lb (152.4 kg)  08/27/18 (!) 329 lb 9.6 oz (149.5 kg)  08/12/18 (!) 330 lb 8 oz (149.9 kg)   Lungs clear Cardiac RRR without m or g Abd benign Ext, now only 1+ edema.  Not wt increase.  I agree with patient this is likely premenstrual fluid retention.    Health Maintenance Due  Topic Date Due  . TETANUS/TDAP  08/12/1994  . PAP SMEAR-Modifier  08/11/1996    There are no preventive care reminders to display for this patient.  Lab Results  Component Value Date   TSH 1.120  05/13/2018   Lab Results  Component Value Date   WBC 9.2 05/13/2018   HGB 11.6 05/13/2018  HCT 36.8 05/13/2018   MCV 73 (L) 05/13/2018   PLT 391 05/13/2018   Lab Results  Component Value Date   NA 139 08/27/2018   K 4.2 08/27/2018   CO2 22 08/27/2018   GLUCOSE 102 (H) 08/27/2018   BUN 19 08/27/2018   CREATININE 0.77 08/27/2018   BILITOT 0.4 08/27/2018   ALKPHOS 77 08/27/2018   AST 12 08/27/2018   ALT 16 08/27/2018   PROT 7.8 08/27/2018   ALBUMIN 4.5 08/27/2018   CALCIUM 9.6 08/27/2018   ANIONGAP 8 07/31/2016   Lab Results  Component Value Date   CHOL 231 (H) 08/27/2018   Lab Results  Component Value Date   HDL 70 08/27/2018   Lab Results  Component Value Date   LDLCALC 152 (H) 08/27/2018   Lab Results  Component Value Date   TRIG 47 08/27/2018   Lab Results  Component Value Date   CHOLHDL 3.3 08/27/2018   Lab Results  Component Value Date   HGBA1C 5.2 08/27/2018      Assessment & Plan:   Problem List Items Addressed This Visit    Morbid obesity ([facility]CC)   Relevant Orders   Amb Referral to Bariatric Surgery      No orders of the defined types were placed in this encounter.   Follow-up: No follow-ups on file.    Moses MannersWilliam A Earnesteen Birnie, MD

## 2018-09-10 NOTE — Assessment & Plan Note (Signed)
Improved

## 2018-09-10 NOTE — Patient Instructions (Signed)
Yes, I think it is a good idea for you to start the process of weight loss surgery - also called bariatric surgery. Dose your lasix based on weight. If weight is above 330, take the lasix twice a day. If the weight is between 320 and 330, take the lasix once a day. If the weight is below 320, no lasix that day. This weight range will change over time as you lose fat weight.

## 2018-09-10 NOTE — Assessment & Plan Note (Signed)
Encourage bariatric surg.  Referral entered.

## 2018-09-23 ENCOUNTER — Encounter: Payer: Self-pay | Admitting: Family Medicine

## 2018-10-12 ENCOUNTER — Telehealth: Payer: Self-pay | Admitting: Family Medicine

## 2018-10-12 NOTE — Telephone Encounter (Signed)
Letter of medical necessity form dropped off at front desk for completion.  Verified that patient section of form has been completed.  Last DOS/WCC with PCP was 09/10/2018.  Placed form in team folder to be completed by clinical staff.  Eldred Manges Magtoto

## 2018-10-12 NOTE — Telephone Encounter (Signed)
Clinical info completed on [facility] Surgery form.  Place form in Dr. Verlon Au box for completion.  Sara [name], CMA

## 2018-11-03 ENCOUNTER — Other Ambulatory Visit: Payer: Self-pay | Admitting: Family Medicine

## 2018-11-05 ENCOUNTER — Other Ambulatory Visit: Payer: Self-pay | Admitting: Surgery

## 2018-11-05 ENCOUNTER — Other Ambulatory Visit (HOSPITAL_COMMUNITY): Payer: Self-pay | Admitting: Surgery

## 2018-11-06 ENCOUNTER — Other Ambulatory Visit: Payer: Self-pay | Admitting: Surgery

## 2018-11-06 DIAGNOSIS — Z1231 Encounter for screening mammogram for malignant neoplasm of breast: Secondary | ICD-10-CM

## 2018-11-09 ENCOUNTER — Other Ambulatory Visit (HOSPITAL_COMMUNITY): Payer: Self-pay | Admitting: Surgery

## 2018-11-09 ENCOUNTER — Ambulatory Visit (HOSPITAL_COMMUNITY)
Admission: RE | Admit: 2018-11-09 | Discharge: 2018-11-09 | Disposition: A | Discharge status: Home / Self Care | Payer: BC Managed Care – PPO | Source: Ambulatory Visit | Attending: Surgery | Admitting: Surgery

## 2018-11-09 ENCOUNTER — Other Ambulatory Visit: Payer: Self-pay

## 2018-11-09 DIAGNOSIS — K219 Gastro-esophageal reflux disease without esophagitis: Secondary | ICD-10-CM | POA: Diagnosis not present

## 2018-11-09 DIAGNOSIS — K449 Diaphragmatic hernia without obstruction or gangrene: Secondary | ICD-10-CM | POA: Diagnosis not present

## 2018-11-10 ENCOUNTER — Encounter: Payer: Self-pay | Admitting: Family Medicine

## 2018-11-10 NOTE — Progress Notes (Signed)
Vitals - 1 value per visit 09/10/2018 08/27/2018 08/12/2018 05/13/2018 5/95/6387  SYSTOLIC 564 332 951 884 166  DIASTOLIC 68 72 82 88 82  Pulse 95 95 91 97 79  Temperature    98.4 98.5  Respirations       Weight (lb) 336 329.6 330.5 308.6 333  Height    5\' 7"  5\' 7"   BMI 52.63 51.62 51.76 48.33 52.16  VISIT REPORT        Vitals - 1 value per visit 09/04/2016 08/14/2016 08/05/2016 0/63/0160 1/0/9323  SYSTOLIC 557 322 025 427   DIASTOLIC 83 78 68 59   Pulse 81 90 80 93   Temperature  98.3 97.8 98   Respirations 20   17   Weight (lb) 321 323.8 326.8  330  Height 5\' 7"  5\' 7"  5\' 7"   5\' 7"   BMI 50.28 50.71 51.18  51.69  VISIT REPORT        Vitals - 1 value per visit 07/30/2016 07/29/2016 07/25/2016 07/23/2016 0/62/3762  SYSTOLIC  831 517 616 073  DIASTOLIC  72 89 64 76  Pulse  92 91 80 74  Temperature  98.7 98 97.8 98.4  Respirations   19  18  Weight (lb) 331.5 326.2 327 329.8 330  Height 5\' 7"  5\' 7"  5\' 7"   5\' 7"   BMI 51.92 51.09 51.22 51.65 51.69  VISIT REPORT        Vitals - 1 value per visit 05/05/2015 04/20/2015 12/07/2014 10/02/6267 06/30/5460  SYSTOLIC 703 500 938 182 993  DIASTOLIC 86 92 74 74 82  Pulse 83  81 74 93  Temperature 97.4  97.9 98.8 98.2  Respirations 20   20   Weight (lb) 331.38 303 319.3 323 333.9  Height 5\' 7"  5\' 7"  5\' 7"  5\' 7"  5\' 7"   BMI 51.89 47.45 50 50.58 52.28  VISIT REPORT        Vitals - 1 value per visit 09/01/2013 08/18/2013 08/11/2013 07/28/2013 10/07/9676  SYSTOLIC 938 101 751  025  DIASTOLIC 76 78 98  84  Pulse 85 99 88 104 96  Temperature 99    98.6  Respirations       Weight (lb) 332 338.8  333.9 336  Height 5\' 7"  5\' 7"   5\' 7"  5\' 7"   BMI 51.99 53.05  52.28 52.61  VISIT REPORT        Vitals - 1 value per visit 07/15/2013 09/10/2011 08/30/2009 08/22/2009 8/52/7782  SYSTOLIC 423 536 144 315 400  DIASTOLIC 83 91 84 80 80  Pulse 93  86 90 80  Temperature 98.4      Respirations       Weight (lb) 338  277 275.8 281  Height 5\' 7"   5' 6.5" 5' 6.5" 5' 6.5"  BMI 52.93   44.04 43.85 44.68  VISIT REPORT        Vitals - 1 value per visit 08/09/2009 07/17/2009 07/05/2009 8/67/6195  SYSTOLIC 093 267 124 580  DIASTOLIC 76 74 76 81  Pulse 78 89 78 90  Temperature      Respirations      Weight (lb) 277 273 267 264.2  Height 5' 6.5" 5' 6.5" 5' 6.5" 5' 6.5"  BMI 44.04 43.41 42.45 42.01  VISIT REPORT       Vitals - 1 value per visit 11/01/2008 08/04/2008 9/98/3382  SYSTOLIC 505  397  DIASTOLIC 80  82  Pulse   673  Temperature     Respirations     Weight (  lb)  280.6 288  Height  5\' 7"  5\' 7"   BMI  43.94 45.1  VISIT REPORT

## 2018-11-11 NOTE — Telephone Encounter (Signed)
Attempted to call patient.  No answer and VM full.  Will place up front for pickup and place copy in to be faxed pile.  Copy made for batch scanning. Christen Bame, CMA

## 2018-11-16 ENCOUNTER — Institutional Professional Consult (permissible substitution): Payer: BC Managed Care – PPO | Admitting: Pulmonary Disease

## 2018-11-18 ENCOUNTER — Other Ambulatory Visit: Payer: Self-pay

## 2018-11-18 ENCOUNTER — Ambulatory Visit
Admission: RE | Admit: 2018-11-18 | Discharge: 2018-11-18 | Disposition: A | Discharge status: Home / Self Care | Payer: BC Managed Care – PPO | Source: Ambulatory Visit | Attending: Surgery | Admitting: Surgery

## 2018-11-18 ENCOUNTER — Ambulatory Visit: Payer: BC Managed Care – PPO

## 2018-11-18 DIAGNOSIS — Z1231 Encounter for screening mammogram for malignant neoplasm of breast: Secondary | ICD-10-CM | POA: Diagnosis not present

## 2018-11-24 ENCOUNTER — Ambulatory Visit: Payer: BC Managed Care – PPO | Admitting: Pulmonary Disease

## 2018-11-24 ENCOUNTER — Other Ambulatory Visit: Payer: Self-pay

## 2018-11-24 ENCOUNTER — Encounter: Payer: Self-pay | Admitting: Pulmonary Disease

## 2018-11-24 VITALS — BP 132/70 | HR 109 | Temp 98.7°F | Ht 66.0 in | Wt 346.6 lb

## 2018-11-24 DIAGNOSIS — R0683 Snoring: Secondary | ICD-10-CM | POA: Diagnosis not present

## 2018-11-24 DIAGNOSIS — R011 Cardiac murmur, unspecified: Secondary | ICD-10-CM

## 2018-11-24 DIAGNOSIS — Z01818 Encounter for other preprocedural examination: Secondary | ICD-10-CM

## 2018-11-24 NOTE — Patient Instructions (Addendum)
Heart murmur --Obtain echocardiogram to evaluate. This test should not delay your upcoming procedure  Snoring --Obtain sleep study to evaluate for sleep apnea. This test should not delay your upcoming procedure  Peri-operative Assessment of Pulmonary Risk for Non-Thoracic Surgery:  For [name], risk of perioperative pulmonary complications is increased by:  Suspected obstructive sleep apnea  Respiratory complications generally occur in 1% of ASA Class I patients, 5% of ASA Class II and 10% of ASA Class III-IV patients These complications rarely result in mortality and iclude postoperative pneumonia, atelectasis, pulmonary embolism, ARDS and increased time requiring postoperative mechanical ventilation.  Overall, I recommend proceeding with the surgery if the risk for respiratory complications are outweighed by the potential benefits. This will need to be discussed between the patient and surgeon.  --Pre- and post-operative incentive spirometry performed frequently while awake --Inpatient use of auto-positive-pressure for suspected OSA whenever the patient is sleeping --Avoiding use of pancuronium during anesthesia.  I have discussed the risk factors and recommendations above with the patient.

## 2018-11-24 NOTE — Progress Notes (Signed)
Subjective:   PATIENT ID: Sara [name], MRN: [id number]   HPI  Chief Complaint  Patient presents with  . Consult    surgical clearance    Reason for Visit: New consult for pre-op evaluation and evaluation for sleep apnea.  [name] is a 43 year old female with morbid obesity and allergic rhinitis who presents as a consult for pre-op evaluation and possible sleep apnea.  [facility] note from 10/29/18 reviewed and summarized: She is pursuing a sleeve gastrectomy after failing multiple diets. Activity is limited by weight and right knee issues. Weight loss was advised. Referred to pulm for possible sleep apnea  Overall, she reports she is healthy with the exception of her weight and knee issues. She is looking forward to her surgery because she has spent many years taking care of her children and now can focus on her. She is currently dieting to lose as much weight as possible before the surgery. From a respiratory standpoint, she denies any history of childhood issue. Many years ago as an adult, she had bronchitis but that has resolved. She reports chronic shortness of breath with exertion that she attributes to her weight. She lives in a 2 story hour and feels like she has to stop on occasion. Denies wheezes. Denies chronic cough.   She reports her energy level is Sara and notices her fatigue worsens with increased weight gain. She reports snoring. Her partner has never witnessed episodes of apnea. Denies nocturnal awakenings or dyspnea, concentration issues. Occasional headaches but she believes they may be related to needing glasses.  Social History: Family hx of asthma in mom Her older brother has hx of frequent pneumonias but no pulmonary hx  I have personally reviewed patient's past medical/family/social history, allergies, current medications.  Past Medical History:  Diagnosis Date  . ABDOMINAL PAIN 07/04/2009   Qualifier:  Diagnosis of  By: [place] MD, Sarah    . ALLERGIC RHINITIS 07/06/2008   Qualifier: Diagnosis of  By: Clelia Croft MD, Kimberlee    . Anemia 09/04/2016  . Arthritis   . Bilateral leg numbness 07/2016  . Bulimia nervosa 08/22/2009   Qualifier: Diagnosis of  By: [place] MD, Sarah    . 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 Class 3 obesity without serious comorbidity with body mass index (BMI) of 50.0 to 59.9 in adult   . DEPRESSION, SEVERE 08/09/2009   Qualifier: Diagnosis of  By: [place] MD, Sarah    . ECZEMA, ATOPIC 08/30/2009   Qualifier: Diagnosis of  By: [place] MD, Sarah    . GRIEF REACTION, ACUTE 07/04/2009   Qualifier: Diagnosis of  By: [place] MD, Sarah    . 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 Iron deficiency anemia 07/2016  . KNEE PAIN, RIGHT 11/01/2008   Qualifier: Diagnosis of  By: Darrick Penna MD, KARL    . MUSCLE CRAMPS 07/04/2009   Qualifier: Diagnosis of  By: [place] MD, Sarah    . Pallor 07/17/2009   Qualifier: Diagnosis of  By: [place] MD, Sarah    . Paresthesia 09/04/2016  . Plantar fasciitis, left 09/10/2011  . 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 PRURITUS, EARS 07/06/2008   Qualifier: Diagnosis of  By: Clelia Croft MD, Kimberlee    . Severe obesity (BMI >= 40) (HCC) 07/06/2008   Qualifier: Diagnosis of  By: Clelia Croft MD, Kimberlee    . SUBACROMIAL  BURSITIS, RIGHT 07/04/2009   Qualifier: Diagnosis of  By: Sarita Haver  MD, Coralyn Mark       Family History  Problem Relation Age of Onset  . Cancer Paternal Grandmother   . Hyperlipidemia Paternal Grandmother   . Asthma Mother   . Glomerulonephritis Mother 69       Basement membrane glomerulonephritis resulting ESRD/dialysis  . Cancer Father   . Diabetes Father   . Hypertension Father   . Kidney disease Father   . Depression Sister   . Deep vein thrombosis Sister        thigh (halimah)  . Kidney disease Maternal Grandmother      Social History    Occupational History    Employer: [employer]    Comment: [employer]  Tobacco Use  . Smoking status: Never Smoker  . Smokeless tobacco: Never Used  Substance and Sexual Activity  . Alcohol use: Never    Frequency: Never  . Drug use: No  . Sexual activity: Yes    Birth control/protection: Surgical    No Known Allergies   Outpatient Medications Prior to Visit  Medication Sig Dispense Refill  . acetaminophen (TYLENOL) 500 MG tablet Take 1,000 mg by mouth every 6 (six) hours as needed for moderate pain or headache.    . cyclobenzaprine (FLEXERIL) 5 MG tablet TAKE 1/2 TO 1 TABLET BY MOUTH AT BEDTIME AS NEEDED FOR MUSCLE SPASM 30 tablet 1  . ferrous sulfate 325 (65 FE) MG tablet TAKE 1 TABLET BY MOUTH EVERY DAY WITH BREAKFAST 30 tablet 0  . furosemide (LASIX) 20 MG tablet Take 1 tablet (20 mg total) by mouth daily. 30 tablet 3  . pantoprazole (PROTONIX) 40 MG tablet TAKE 1 TABLET BY MOUTH EVERY DAY 30 tablet 0  . predniSONE (DELTASONE) 20 MG tablet Take 1 tablet (20 mg total) by mouth daily with breakfast. 10 tablet 0   No facility-administered medications prior to visit.     Review of Systems  Constitutional: Negative for chills, diaphoresis, fever, malaise/fatigue and weight loss.  HENT: Negative for congestion, ear pain and sore throat.   Respiratory: Positive for shortness of breath. Negative for cough, hemoptysis, sputum production and wheezing.   Cardiovascular: Negative for chest pain, palpitations and leg swelling.  Gastrointestinal: Negative for abdominal pain, heartburn and nausea.  Genitourinary: Negative for frequency.  Musculoskeletal: Negative for joint pain and myalgias.  Skin: Negative for itching and rash.  Neurological: Negative for dizziness, weakness and headaches.  Endo/Heme/Allergies: Does not bruise/bleed easily.  Psychiatric/Behavioral: Negative for depression. The patient is not nervous/anxious.      Objective:   Vitals:   11/24/18 1605  BP: 132/70   Pulse: (!) 109  Temp: 98.7 F (37.1 C)  TempSrc: Temporal  SpO2: 98%  Weight: (!) 346 lb 9.6 oz (157.2 kg)  Height: 5\' 6"  (1.676 m)     Physical Exam: General: BMI 55.78. Well-appearing, no acute distress HENT: [place], AT Eyes: EOMI, no scleral icterus Respiratory: Clear to auscultation bilaterally.  No crackles, wheezing or rales Cardiovascular: RRR, -M/R/G, no JVD GI: BS+, soft, nontender Extremities:-Edema,-tenderness Neuro: AAO x4, CNII-XII grossly intact Skin: Intact, no rashes or bruising Psych: Normal mood, normal affect  Data Reviewed:  Imaging: 07/26/18 - Clear lung fields. Minimal bronchial wall thickining.  PFT: None on file  Labs: CBC    Component Value Date/Time   WBC 9.2 05/13/2018 1010   WBC 6.9 07/31/2016 0750   RBC 5.03 05/13/2018 1010   RBC 5.03 07/31/2016 0750   HGB  11.6 05/13/2018 1010   HCT 36.8 05/13/2018 1010   PLT 391 05/13/2018 1010   MCV 73 (L) 05/13/2018 1010   MCH 23.1 (L) 05/13/2018 1010   MCH 22.7 (L) 07/31/2016 0750   MCHC 31.5 05/13/2018 1010   MCHC 34.5 07/31/2016 0750   RDW 15.8 (H) 05/13/2018 1010   LYMPHSABS 2.9 07/30/2016 1042   MONOABS 0.3 07/30/2016 1042   EOSABS 0.1 07/30/2016 1042   BASOSABS 0.0 07/30/2016 1042   BMET    Component Value Date/Time   NA 139 08/27/2018 1028   K 4.2 08/27/2018 1028   CL 102 08/27/2018 1028   CO2 22 08/27/2018 1028   GLUCOSE 102 (H) 08/27/2018 1028   GLUCOSE 104 (H) 07/31/2016 0750   BUN 19 08/27/2018 1028   CREATININE 0.77 08/27/2018 1028   CALCIUM 9.6 08/27/2018 1028   GFRNONAA 95 08/27/2018 1028   GFRAA 109 08/27/2018 1028    Imaging, labs and tests noted above have been reviewed independently by me.    Assessment & Plan:   Discussion: 43 year old female with morbid obesity, allergic rhinitis who presents for pre-op evaluation and concern for sleep apnea. STOP-BANG score demonstrates high risk (snore, tired, BMI >35). New grade III/VI systolic ejection murmur noted in  asymptomatic patient. This finding should not delay her surgery but will plan to evaluate with TTE for further evaluation.  Heart murmur --Obtain echocardiogram to evaluate. This test should not delay your upcoming procedure  Snoring --Obtain sleep study to evaluate for sleep apnea. This test should not delay your upcoming procedure  Peri-operative Assessment of Pulmonary Risk for Non-Thoracic Surgery:  For Ms. Azucena Kubaeid, risk of perioperative pulmonary complications is increased by:  Suspected obstructive sleep apnea  Respiratory complications generally occur in 1% of ASA Class I patients, 5% of ASA Class II and 10% of ASA Class III-IV patients These complications rarely result in mortality and iclude postoperative pneumonia, atelectasis, pulmonary embolism, ARDS and increased time requiring postoperative mechanical ventilation.  Overall, I recommend proceeding with the surgery if the risk for respiratory complications are outweighed by the potential benefits. This will need to be discussed between the patient and surgeon.  --Pre- and post-operative incentive spirometry performed frequently while awake --Inpatient use of auto-positive-pressure for suspected OSA whenever the patient is sleeping --Avoiding use of pancuronium during anesthesia.  I have discussed the risk factors and recommendations above with the patient.  Health Maintenance  There is no immunization history on file for this patient.  Orders Placed This Encounter  Procedures  . ECHOCARDIOGRAM COMPLETE    Standing Status:   Future    Standing Expiration Date:   02/23/2020    Scheduling Instructions:     Done before September 16th 2020    Order Specific Question:   Where should this test be performed    Answer[facility]    Order Specific Question:   Perflutren DEFINITY (image enhancing agent) should be administered unless hypersensitivity or allergy exist    Answer:   Administer Perflutren    Order Specific Question:    Reason for exam-Echo    Answer:   Murmur  785.2 / R01.1  . Split night study    Standing Status:   Future    Standing Expiration Date:   11/24/2019    Order Specific Question:   Where should this test be performed:    Answer:   [facility]  No orders of the defined types were placed in this encounter.  Return when tests resulted.  Lashona Schaaf Mechele CollinJane Takeyah Wieman, MD [facility] 11/24/2018 7:59 AM  Office Number [phone]

## 2018-11-26 ENCOUNTER — Encounter: Payer: BC Managed Care – PPO | Attending: Surgery | Admitting: Skilled Nursing Facility1

## 2018-11-26 ENCOUNTER — Other Ambulatory Visit: Payer: Self-pay

## 2018-11-26 ENCOUNTER — Encounter: Payer: Self-pay | Admitting: Skilled Nursing Facility1

## 2018-11-26 DIAGNOSIS — E669 Obesity, unspecified: Secondary | ICD-10-CM | POA: Insufficient documentation

## 2018-11-26 NOTE — Progress Notes (Signed)
Pre-Op Assessment Visit:  Pre-Operative Sleeve Gastrectomy Surgery  Medical Nutrition Therapy:  Appt start time: 7:25  End time:  8:30  Patient was seen on 11/26/2018 for Pre-Operative Nutrition Assessment. Assessment and letter of approval faxed to [facility] B[facility]ric Surgery Program coordinator on 11/26/2018.    Referral Stated SWL Appointments Required: 0  Proposed Surgery Type: Sleeve Gastrectomy   Pt expectation of surgery: none stated   Pt expectation of dietitian: none states    NUTRITION ASSESSMENT   Anthropometrics  Start weight at [facility]: 345.6 lbs Today's weight: 345.6 lbs BMI: 55.78 kg/m2     Psychosocial/Lifestyle Dx: Bulimia Nervosa is listed in epic under medical hx   Pt states she makes herself throw up sometimes due to overeating and being uncomfortable (last event being about 3 weeks ago). Pt states she met with the psychologist and states it was only 10 minutes and states the psychologist told her she seems to have an emotional relationship with food but has no plans to follow up with the pt: pt states due to this experience she is not open to working with a mental health professional. Pt states she intentionally skips meals throughout the day to lose weight: Dietitian educated the pt on this behavior not leading to weight loss and it being an unhealthy relationship.   Pt did not see the mental health connection with binging and purging and states she only told the providers because she was scared it would cause physical damage to her body if she did it after surgery.    Pt seems unaware of her current behaviors and how she deals with stress. By the end of the appt she was starting to realize she deals with stress through overeating.   Pt is wanting to get surgery before December so is not willing to slow the process down.  Dietitian had pt fill out the EAT 26 screening with unremarkable findings which was not surprising given her lack of understanding  with her own behaviors.   Pt states her husband is supportive.   Pt state she always cooks 2 meats for her meals.   Pt will follow up with dietitian in 2 weeks to discuss relationship with food.   Medications: Lasix  Labs:   Notable Signs/Symptoms Binging/Purging    24-Hr Dietary Recall First Meal: fast food biscuit with cheese grits or skipped Snack:  Second Meal: salad or chicken and steak with greens with cabbage or sandwich Snack: chips Third Meal: chicken and steak with greens with cabbage  Snack:  Beverages: water, diet tea, diet soda    Physical Activity  ADL's   Estimated Energy Needs Calories: 1600 Carbohydrate: 180 Protein: 120 Fat: 44   NUTRITION DIAGNOSIS  Overweight/obesity ([place]-3.3) related to past poor dietary habits and physical inactivity as evidenced by patient w/ planned Sleeve surgery following dietary guidelines for continued weight loss.    NUTRITION INTERVENTION  Nutrition counseling (C-1) and education (E-2) to facilitate bariatric surgery goals.   Handouts given during visit include:  . Pre-Op Goals . Bariatric Surgery Protein Shakes . Vitamin and Mineral Options    During the appointment today the following Pre-Op Goals were reviewed with the patient: . Log your food and beverage via an app or pen and paper: Use the ATE app . Make healthy food choices . Begin to limit portion sizes . Limited concentrated sugars and fried foods . Keep fat/sugar in the single digits per serving on  food labels . Practice CHEWING your food  (aim for 30 chews per bite or until applesauce consistency) . Practice not drinking 15 minutes before, during, and 30 minutes after each meal/snack . Avoid all carbonated beverages  . Avoid/limit caffeinated beverages  . Avoid all sugar-sweetened beverages . Consume 3 meals per day; eat every 3-5 hours . Make a list of non-food related activities . Aim for 64-100 ounces of FLUID daily  . Aim for at  least 60-80 grams of PROTEIN daily . Look for a liquid protein source that contain ?15 g protein and ?5 g carbohydrate  (ex: shakes, drinks, shots)   Change readiness: Contemplative   Demonstrated degree of understanding via: Teach Back      MONITORING & EVALUATION Dietary intake, weekly physical activity, body weight, and pre-op goals reached.    Next Steps  Patient is to call [facility] (once surgery date is scheduled) to be scheduled for Pre-Op Class, which must be at least 2 weeks prior to surgery date or back follow up visit in 1 month

## 2018-11-27 DIAGNOSIS — Z713 Dietary counseling and surveillance: Secondary | ICD-10-CM | POA: Diagnosis not present

## 2018-12-02 ENCOUNTER — Ambulatory Visit (HOSPITAL_COMMUNITY): Payer: BC Managed Care – PPO | Attending: Cardiology

## 2018-12-02 ENCOUNTER — Other Ambulatory Visit: Payer: Self-pay

## 2018-12-02 DIAGNOSIS — R011 Cardiac murmur, unspecified: Secondary | ICD-10-CM

## 2018-12-02 MED ORDER — PERFLUTREN LIPID MICROSPHERE
1.0000 mL | INTRAVENOUS | Status: AC | PRN
  Administered 2018-12-02: 2 mL via INTRAVENOUS

## 2018-12-04 ENCOUNTER — Telehealth: Payer: Self-pay | Admitting: *Deleted

## 2018-12-04 NOTE — Telephone Encounter (Signed)
Dr. Loanne Drilling I will call patient to get the surgeons name to let them have this report. I will also tell patient her results are on mychart. Also Dr. Loanne Drilling, do you still want me to schedule patient for appointment mid October prior to her surgery?

## 2018-12-04 NOTE — Telephone Encounter (Signed)
-----   Message from [place], MD sent at 12/04/2018 11:00 AM EDT ----- I called patient for test results. Overall normal echocardiogram. Unable to assess RVSP however RV size and function normal.   LR- please send this report to patient and patient's surgeon who referred her to our clinic with the following note:  Thank you for referring Lourdez Mcgahan for pulmonary pre-op evaluation. Additional testing ordered includes echocardiogram and sleep study. Echocardiogram reviewed and is normal. Sleep study pending however this does not need to be completed prior to surgery. My recommendation would be to presume OSA diagnosis and treat patient with NIV post-op and while sleeping as an inpatient.   Peri-operative Assessment of Pulmonary Risk for Non-Thoracic Surgery:  For Ms. Joneen Caraway, risk of perioperative pulmonary complications is increased by:  Suspected Obstructive sleep apnea  Respiratory complications generally occur in 1% of ASA Class I patients, 5% of ASA Class II and 10% of ASA Class III-IV patients These complications rarely result in mortality and iclude postoperative pneumonia, atelectasis, pulmonary embolism, ARDS and increased time requiring postoperative mechanical ventilation.  Overall, I recommend proceeding with the surgery if the risk for respiratory complications are outweighed by the potential benefits. This will need to be discussed between the patient and surgeon.  To reduce risks of respiraotry complications, I recommend: --Pre- and post-operative incentive spirometry performed frequently while awake --Inpatient use of Autopap 5-15cm H20 post-op and whenever the patient is sleeping --Avoiding use of pancuronium during anesthesia.  I have discussed the risk factors and recommendations above with the patient.

## 2018-12-05 NOTE — Telephone Encounter (Signed)
Yes, hopefully her sleep study results will be available at that point for [place] to discuss and to arrange any last minute recommendations prior to her surgery.  JE

## 2018-12-07 ENCOUNTER — Telehealth: Payer: Self-pay | Admitting: *Deleted

## 2018-12-07 NOTE — Telephone Encounter (Signed)
-----   Message from [place], MD sent at 12/04/2018  7:29 PM EDT ----- Regarding: RE: Sleep study LR- She needs an in-lab split night study. If she does have OSA, I suspect she may need BiPAP but will need to be titrated on CPAP. ----- Message ----- From: Amado Coe, RN Sent: 12/04/2018   2:33 PM EDT To: Margaretha Seeds, MD Subject: RE: Sleep study                                Home Sleep studies usually take 2-3 weeks to contact patient and set up. Her appointment was only 1 week ago. I created a phone note from the last message with the Echo results. So once I hear back about the appointment I will CC the PCC's and call the patient. I told the patient this at her appt.  LR ----- Message ----- From: Margaretha Seeds, MD Sent: 12/04/2018  11:01 AM EDT To: Amado Coe, RN Subject: Sleep study                                    I called patient today for results and she said no one has contacted her to schedule sleep study yet. Can you check in with the schedulers?

## 2018-12-07 NOTE — Telephone Encounter (Signed)
Called spoke with patient. She is scheduled for split-night on 9/28. I scheduled her for appt prior to her surgery (still no set date) 01/04/19 worked best with work schedule.   Nothing further needed at this time. Will route to [place] as FYI

## 2018-12-07 NOTE — Telephone Encounter (Signed)
I had left a message with the sleep center on 11/24/18 they left her a message on 9/4 I think I called her again this morning and transferred her over there and she now has a appt 9/28

## 2018-12-10 ENCOUNTER — Other Ambulatory Visit: Payer: Self-pay

## 2018-12-10 ENCOUNTER — Encounter: Payer: BC Managed Care – PPO | Admitting: Dietician

## 2018-12-10 DIAGNOSIS — E669 Obesity, unspecified: Secondary | ICD-10-CM | POA: Diagnosis not present

## 2018-12-10 NOTE — Progress Notes (Signed)
Bariatric Supervised Weight Loss Visit Appt Start Time: 4:45pm     End Time: 5:30pm  Planned Surgery: Sleeve Gastrectomy    1st SWL Appointment  SWL Appointments Required: 0    NUTRITION ASSESSMENT  Anthropometrics  Start weight at [facility]: 345.6 lbs (date: 11/26/2018) Today's weight: 347.5 lbs  Psychosocial/Lifestyle Patient arrives today for a SWL visit to work on [place] and emotional relationship with food. Pt states she has been stressed lately since she did not know when she would be having surgery. However, states she got a surgery date yesterday for 01/18/2019 and now she feels like she can focus better on her eating habits. States she works well knowing deadlines and is better able to set goals this way. States her husband and family are supportive and that she is excited for surgery.   24-Hr Dietary Recall First Meal: none stated  Snack: none stated  Second Meal: ribeye steak + double broccoli + bread Snack: none stated Third Meal: yogurt + granola  Snack: none stated  Beverages: water, diet tea, diet soda   Food & Nutrition Related Hx Dietary Hx: Typical meal intake is 2 meals/day. Hx of binge eating. States she has tried to focus on increasing water intake lately as well as paying attention to nutrition facts labels on foods. States she is looking forward to the meal plans to follow throughout the bariatric surgery process and that she would like to start following something similar to the Pre-Op Diet now.   Physical Activity  Current average weekly physical activity: ADLs  Estimated Energy Needs Calories: 1600 Carbohydrate: 180g Protein: 120g Fat: 44g   NUTRITION DIAGNOSIS  Overweight/obesity (-3.3) related to past poor dietary habits and physical inactivity as evidenced by patient w/ planned Sleeve Gastrectomy surgery following dietary guidelines for continued weight loss.   NUTRITION INTERVENTION  Nutrition counseling (C-1) and education (E-2) to  facilitate bariatric surgery goals.  Pre-Op Goals Progress & New Goals . Consume 3 meals/day  . Practice chewing food until applesauce consistency   Handouts Provided Include   Pre-Op Goals   Bariatric Plant-Based Protein Supplements   [facility] Bariatric Supplement Price List (per pt request)   Pre-Op Diet (per pt request)   Learning Style & Readiness for Change Teaching method utilized: Visual & Auditory  Demonstrated degree of understanding via: Teach Back  Barriers to learning/adherence to lifestyle change: Hx of emotional relationship with food     MONITORING & EVALUATION Dietary intake, weekly physical activity, body weight, and pre-op goals at Pre-Op Class.   Next Steps  Patient is to return to [facility] at least 2 weeks prior to surgery for Pre-Op Class nutrition education. Patient was provided phone and email to contact with questions, concerns, or to schedule another SWL visit as needed prior to surgery/Pre-Op Class.

## 2018-12-11 ENCOUNTER — Encounter: Payer: Self-pay | Admitting: Family Medicine

## 2018-12-11 DIAGNOSIS — R011 Cardiac murmur, unspecified: Secondary | ICD-10-CM | POA: Insufficient documentation

## 2018-12-18 ENCOUNTER — Other Ambulatory Visit (HOSPITAL_COMMUNITY)
Admission: RE | Admit: 2018-12-18 | Discharge: 2018-12-18 | Disposition: A | Discharge status: Home / Self Care | Payer: BC Managed Care – PPO | Source: Ambulatory Visit | Attending: Pulmonary Disease | Admitting: Pulmonary Disease

## 2018-12-18 DIAGNOSIS — Z20828 Contact with and (suspected) exposure to other viral communicable diseases: Secondary | ICD-10-CM | POA: Diagnosis not present

## 2018-12-19 LAB — NOVEL CORONAVIRUS, NAA (HOSP ORDER, SEND-OUT TO REF LAB; TAT 18-24 HRS): SARS-CoV-2, NAA: NOT DETECTED

## 2018-12-21 ENCOUNTER — Ambulatory Visit (HOSPITAL_BASED_OUTPATIENT_CLINIC_OR_DEPARTMENT_OTHER): Payer: BC Managed Care – PPO | Attending: Pulmonary Disease | Admitting: Pulmonary Disease

## 2018-12-21 ENCOUNTER — Other Ambulatory Visit: Payer: Self-pay

## 2018-12-21 VITALS — Ht 66.0 in | Wt 342.0 lb

## 2018-12-21 DIAGNOSIS — G4733 Obstructive sleep apnea (adult) (pediatric): Secondary | ICD-10-CM | POA: Insufficient documentation

## 2018-12-21 DIAGNOSIS — R0683 Snoring: Secondary | ICD-10-CM | POA: Diagnosis not present

## 2018-12-23 DIAGNOSIS — R0683 Snoring: Secondary | ICD-10-CM

## 2018-12-23 NOTE — Procedures (Signed)
    Patient Name: Sara [name], Sara [name] [name] Date: 12/21/2018 Gender: Female D.O.B: 1975/09/09 Age (years): 48 Referring Provider: Chi Rodman Pickle Height (inches): 59 Interpreting Physician: Chesley Mires MD, ABSM Weight (lbs): 342 RPSGT: Gwenyth Allegra BMI: 2 MRN: [id number] Neck Size: 16.00  CLINICAL INFORMATION Sleep Study Type: NPSG  Indication for sleep study: Snoring, sleep disruption, and daytime sleepiness.  Presents for evaluation of obstructive sleep apnea.  Epworth Sleepiness Score: 4  SLEEP STUDY TECHNIQUE As per the AASM Manual for the Scoring of Sleep and Associated Events v2.3 (April 2016) with a hypopnea requiring 4% desaturations.  The channels recorded and monitored were frontal, central and occipital EEG, electrooculogram (EOG), submentalis EMG (chin), nasal and oral airflow, thoracic and abdominal wall motion, anterior tibialis EMG, snore microphone, electrocardiogram, and pulse oximetry.  MEDICATIONS Medications self-administered by patient taken the night of the study : N/A  SLEEP ARCHITECTURE The study was initiated at 9:52:02 PM and ended at 4:00:58 AM.  Sleep onset time was 34.8 minutes and the sleep efficiency was 62.6%%. The total sleep time was 231 minutes.  Stage REM latency was 219.5 minutes.  The patient spent 11.5%% of the night in stage N1 sleep, 80.7%% in stage N2 sleep, 0.0%% in stage N3 and 7.8% in REM.  Alpha intrusion was absent.  Supine sleep was 34.63%.  RESPIRATORY PARAMETERS The overall apnea/hypopnea index (AHI) was 6.8 per hour. There were 5 total apneas, including 2 obstructive, 3 central and 0 mixed apneas. There were 21 hypopneas and 9 RERAs.  The AHI during Stage REM sleep was 30.0 per hour.  AHI while supine was 1.5 per hour.  The mean oxygen saturation was 93.5%. The minimum SpO2 during sleep was 85.0%.  soft snoring was noted during this study.  CARDIAC DATA The 2 lead EKG demonstrated sinus rhythm. The mean heart  rate was 79.3 beats per minute. Other EKG findings include: None.  LEG MOVEMENT DATA The total PLMS were 0 with a resulting PLMS index of 0.0. Associated arousal with leg movement index was 0.0 .  IMPRESSIONS - Mild obstructive sleep apnea with an AHI of 6.8 and SpO2 low of 85%.  Majority of events occurred during REM sleep with an REM AHI of 30.  DIAGNOSIS - Obstructive Sleep Apnea (327.23 [G47.33 ICD-10])  RECOMMENDATIONS - Additional therapies include weight loss, CPAP, oral appliance, or surgical assessment.  [Electronically signed] 12/23/2018 08:43 AM  Chesley Mires MD, ABSM Diplomate, American Board of Sleep Medicine   NPI: [id number]

## 2018-12-30 ENCOUNTER — Telehealth: Payer: Self-pay | Admitting: *Deleted

## 2018-12-30 NOTE — Telephone Encounter (Signed)
-----   Message from [place], MD sent at 12/28/2018  4:22 PM EDT ----- Regarding: Start Auto-CPAP Patient diagnosed with mild obstructive sleep apnea. I recommend starting CPAP to minimize her surgical risk before surgery and while she loses weight.  If she is agreeable, please order Auto-PAP 5-15 cm H20.  JE ----- Message ----- From: Chesley Mires, MD Sent: 12/23/2018   8:45 AM EDT To: Dickie La, MD, Chi Rodman Pickle, MD

## 2018-12-30 NOTE — Telephone Encounter (Signed)
ATC patient unable to reach voicemail full (x1)

## 2018-12-31 ENCOUNTER — Ambulatory Visit: Payer: BC Managed Care – PPO

## 2019-01-04 ENCOUNTER — Ambulatory Visit: Payer: BC Managed Care – PPO | Admitting: Pulmonary Disease

## 2019-01-04 ENCOUNTER — Encounter: Payer: Self-pay | Admitting: Pulmonary Disease

## 2019-01-04 ENCOUNTER — Other Ambulatory Visit: Payer: Self-pay

## 2019-01-04 VITALS — BP 122/68 | HR 87 | Temp 97.8°F | Ht 66.0 in | Wt 347.2 lb

## 2019-01-04 DIAGNOSIS — Z01818 Encounter for other preprocedural examination: Secondary | ICD-10-CM

## 2019-01-04 DIAGNOSIS — G4733 Obstructive sleep apnea (adult) (pediatric): Secondary | ICD-10-CM | POA: Diagnosis not present

## 2019-01-04 NOTE — Progress Notes (Signed)
Subjective:   PATIENT ID: Sara [name] GENDER: female DOB: 1976-02-07, MRN: [id number]   HPI  Chief Complaint  Patient presents with  . Follow-up   Reason for Visit: Follow-up   [name] is a 43 year old female with morbid obesity, allergic rhinitis and newly diagnosed obstructive sleep apnea who presents for follow-up.  She is planned for sleeve gastrectomy at the end of this month. Her surgeon initially referred her to me for sleep evaluation. Since our last visit, she has underwent sleep study and echocardiogram. She is looking forward to her surgery. She has questions regarding how long she needs CPAP and what the benefits of CPAP are. She still has shortness of breath with exertion which she attributes to her weight. Otherwise, denies chest pain, cough, wheezing or lower extremity edema.  Social History: Family hx of asthma in mom Her older brother has hx of frequent pneumonias but no pulmonary hx  I have personally reviewed patient's past medical/family/social history/allergies/current medications.  Past Medical History:  Diagnosis Date  . ABDOMINAL PAIN 07/04/2009   Qualifier: Diagnosis of  By: [place]ique MD, Sarah    . ALLERGIC RHINITIS 07/06/2008   Qualifier: Diagnosis of  By: Brigitte Pulse MD, Kimberlee    . Anemia 09/04/2016  . Arthritis   . Bilateral leg numbness 07/2016  . Bulimia nervosa 08/22/2009   Qualifier: Diagnosis of  By: [place]ique MD, Sarah    . 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 Class 3 obesity without serious comorbidity with body mass index (BMI) of 50.0 to 59.9 in adult   . DEPRESSION, SEVERE 08/09/2009   Qualifier: Diagnosis of  By: [place]ique MD, Sarah    . ECZEMA, ATOPIC 08/30/2009   Qualifier: Diagnosis of  By: [place]ique MD, Sarah    . GRIEF REACTION, ACUTE 07/04/2009   Qualifier: Diagnosis of  By: Martinique MD, Sarah    . 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 Iron deficiency anemia 07/2016  .  KNEE PAIN, RIGHT 11/01/2008   Qualifier: Diagnosis of  By: Oneida Alar MD, KARL    . MUSCLE CRAMPS 07/04/2009   Qualifier: Diagnosis of  By: Martinique MD, Sarah    . Pallor 07/17/2009   Qualifier: Diagnosis of  By: Martinique MD, Sarah    . Paresthesia 09/04/2016  . Plantar fasciitis, left 09/10/2011  . 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 PRURITUS, EARS 07/06/2008   Qualifier: Diagnosis of  By: Brigitte Pulse MD, Kimberlee    . Severe obesity (BMI >= 40) ([place]) 07/06/2008   Qualifier: Diagnosis of  By: Brigitte Pulse MD, Kimberlee    . SUBACROMIAL BURSITIS, RIGHT 07/04/2009   Qualifier: Diagnosis of  By: Sarita Haver  MD, Coralyn Mark       Family History  Problem Relation Age of Onset  . Cancer Paternal Grandmother   . Hyperlipidemia Paternal Grandmother   . Asthma Mother   . Glomerulonephritis Mother 22       Basement membrane glomerulonephritis resulting ESRD/dialysis  . Cancer Father   . Diabetes Father   . Hypertension Father   . Kidney disease Father   . Kidney disease Maternal Grandmother   . Deep vein thrombosis Sister   . Endometriosis Sister   . Depression Sister      Social History   Occupational History    Employer: [employer]    Comment: [employer]  Tobacco Use  . Smoking status: Never Smoker  . Smokeless  tobacco: Never Used  Substance and Sexual Activity  . Alcohol use: Never    Frequency: Never  . Drug use: No  . Sexual activity: Yes    Birth control/protection: Surgical    No Known Allergies   Outpatient Medications Prior to Visit  Medication Sig Dispense Refill  . acetaminophen (TYLENOL) 500 MG tablet Take 1,000 mg by mouth every 6 (six) hours as needed for moderate pain or headache.    . cyclobenzaprine (FLEXERIL) 5 MG tablet TAKE 1/2 TO 1 TABLET BY MOUTH AT BEDTIME AS NEEDED FOR MUSCLE SPASM (Patient not taking: Reported on 01/04/2019) 30 tablet 1  . furosemide (LASIX) 20 MG tablet Take 1 tablet (20 mg total) by mouth daily. (Patient not taking:  Reported on 01/04/2019) 30 tablet 3   No facility-administered medications prior to visit.     Review of Systems  Constitutional: Negative for chills, fever and malaise/fatigue.  HENT: Negative for congestion.   Respiratory: Positive for shortness of breath. Negative for cough, sputum production and wheezing.   Cardiovascular: Negative for chest pain, palpitations and leg swelling.  Neurological: Negative for headaches.    Objective:   Vitals:   01/04/19 0948  BP: 122/68  Pulse: 87  Temp: 97.8 F (36.6 C)  TempSrc: Temporal  SpO2: 100%  Weight: (!) 347 lb 3.2 oz (157.5 kg)  Height: 5\' 6"  (1.676 m)   SpO2: 100 % O2 Device: None (Room air)  Physical Exam: General: BMI 55.2. Well-appearing, no acute distress HENT: [place], AT Eyes: EOMI, no scleral icterus Respiratory: Clear to auscultation bilaterally.  No crackles, wheezing or rales Cardiovascular: RRR, -M/R/G, no JVD GI: BS+, soft, nontender Extremities:-Edema,-tenderness Neuro: AAO x4, CNII-XII grossly intact Skin: Intact, no rashes or bruising Psych: Normal mood, normal affect  Data Reviewed:  Imaging: 07/26/18 - Clear lung fields. Minimal bronchial wall thickining.  PFT: None on file  Sleep Study: 12/21/18 - Mild obstructive sleep apnea with an AHI of 6.8 and SpO2 low of 85%.  Majority of events occurred during REM sleep with an REM AHI of 30.  TTE: 12/02/18 EF 60-65%. No WMA or valvular abnormalities  Labs: No recent or pertinent labs  Imaging, labs and test noted above have been reviewed independently by me.    Assessment & Plan:   Discussion: 43 year old female with morbid obesity, allergic rhinitis and newly diagnosed obstructive sleep apnea who presents for follow-up. We reviewed her sleep study in detail and I addressed questions regarding treatment for OSA including CPAP, oral appliance and weight loss. She is willing to trial CPAP but is interested in repeat sleep study after her surgery.  Mild OSA  The natural history, progression and prognosis of sleep apnea, treatment with PAP and alternative treatment strategies were discussed. The patient was also educated regarding the long term cardiovascular benefits of treating sleep apnea, including improved blood pressure control, reduction in MI and stroke risk as well as other potential benefits of treatment, such as improved glycemic control, facilitation of weight loss, improved energy during the day and improved sleep quality. --Counseled on sleep hygiene --Counseled on weight loss/maintenance of healthy weight --Counseled NOT to drive if/when sleepy --Advised patient to wear CPAP for at least 4 hours each night for greater than 70% of the time to avoid the machine being repossessed by insurance.  Peri-operative Assessment of Pulmonary Risk for Non-Thoracic Surgery:  For [name], risk of perioperative pulmonary complications is increased by:  Suspected obstructive sleep apnea  Respiratory complications generally occur in  1% of ASA Class I patients, 5% of ASA Class II and 10% of ASA Class III-IV patients These complications rarely result in mortality and iclude postoperative pneumonia, atelectasis, pulmonary embolism, ARDS and increased time requiring postoperative mechanical ventilation.  Overall, I recommend proceeding with the surgery if the risk for respiratory complications are outweighed by the potential benefits. This will need to be discussed between the patient and surgeon.  --Pre- and post-operative incentive spirometry performed frequently while awake --Inpatient use of auto-positive-pressure for suspected OSA whenever the patient is sleeping --Avoiding use of pancuronium during anesthesia.  I have discussed the risk factors and recommendations above with the patient.  Health Maintenance  There is no immunization history on file for this patient.  Orders Placed This Encounter  Procedures  . Ambulatory Referral for DME     Referral Priority:   Urgent    Referral Type:   Durable Medical Equipment Purchase    Number of Visits Requested:   1  No orders of the defined types were placed in this encounter.   Return in about 3 months (around 04/06/2019).  Greater than 50% of this patient 25-minute office visit was spent face-to-face in counseling with the patient/family. We discussed medical diagnosis and treatment plan as noted.   Mechele Collin, MD [facility] 01/04/2019 10:06 AM  Office Number [phone]

## 2019-01-04 NOTE — Patient Instructions (Signed)
Mild OSA The natural history, progression and prognosis of sleep apnea, treatment with PAP and alternative treatment strategies were discussed. The patient was also educated regarding the long term cardiovascular benefits of treating sleep apnea, including improved blood pressure control, reduction in MI and stroke risk as well as other potential benefits of treatment, such as improved glycemic control, facilitation of weight loss, improved energy during the day and improved sleep quality. --Counseled on sleep hygiene --Counseled on weight loss/maintenance of healthy weight --Counseled NOT to drive if/when sleepy --Advised patient to wear CPAP for at least 4 hours each night for greater than 70% of the time to avoid the machine being repossessed by insurance.   Peri-operative Assessment of Pulmonary Risk for Non-Thoracic Surgery:  For Sara [name], risk of perioperative pulmonary complications is increased by:  Ostructive sleep apnea  Respiratory complications generally occur in 1% of ASA Class I patients, 5% of ASA Class II and 10% of ASA Class III-IV patients These complications rarely result in mortality and iclude postoperative pneumonia, atelectasis, pulmonary embolism, ARDS and increased time requiring postoperative mechanical ventilation.  Overall, I recommend proceeding with the surgery if the risk for respiratory complications are outweighed by the potential benefits. This will need to be discussed between the patient and surgeon.  --Pre- and post-operative incentive spirometry performed frequently while awake --Inpatient use of auto-PAP for OSA whenever the patient is sleeping --Avoiding use of pancuronium during anesthesia.  I have discussed the risk factors and recommendations above with the patient.

## 2019-01-04 NOTE — Telephone Encounter (Signed)
Patient is in office, she wants to talk to Dr. Loanne Drilling further about it.   Patient did get phone call from office.  Will close this message

## 2019-01-08 ENCOUNTER — Telehealth: Payer: Self-pay | Admitting: Family Medicine

## 2019-01-08 NOTE — Telephone Encounter (Signed)
Dr. Sharen Counter calling to do a peer to peer with Dr. Andria Frames regarding this patient. His call back number is [phone].   Forwarding this to [place] and pt's PCP, Nori Riis.

## 2019-01-11 ENCOUNTER — Encounter (HOSPITAL_COMMUNITY): Payer: Self-pay

## 2019-01-11 ENCOUNTER — Other Ambulatory Visit: Payer: Self-pay

## 2019-01-11 ENCOUNTER — Encounter: Payer: BC Managed Care – PPO | Attending: Surgery | Admitting: Skilled Nursing Facility1

## 2019-01-11 DIAGNOSIS — E669 Obesity, unspecified: Secondary | ICD-10-CM | POA: Diagnosis not present

## 2019-01-11 NOTE — Telephone Encounter (Signed)
I believe Dr. Nori Riis can best respond to questions.  I do not believe that I have seen this patient.

## 2019-01-11 NOTE — Progress Notes (Signed)
Pre-Operative Nutrition Class:  Appt start time: 8403   End time:  1830.  Patient was seen on 01/11/2019 for Pre-Operative Bariatric Surgery [facility]   Surgery date: 01/18/2019 Surgery type: sleeve Start weight at [facility]: 345.6 Weight today: 345.3  Samples given per MNT protocol. Patient educated on appropriate usage: Bariatric Advantage Multivitamin Lot # [id number] Exp: 01/22   The following the learning objectives were met by the patient during this course:  Identify Pre-Op Dietary Goals and will begin 2 weeks pre-operatively  Identify appropriate sources of fluids and proteins   State protein recommendations and appropriate sources pre and post-operatively  Identify Post-Operative Dietary Goals and will follow for 2 weeks post-operatively  Identify appropriate multivitamin and calcium sources  Describe the need for physical activity post-operatively and will follow MD recommendations  State when to call healthcare provider regarding medication questions or post-operative complications  Handouts given during class include:  Pre-Op Bariatric Surgery Diet Handout  Protein Shake Handout  Post-Op Bariatric Surgery Nutrition Handout  BELT Program Information Flyer  Support Group Information Flyer  [facility] Bariatric Supplements Price List  Follow-Up Plan: Patient will follow-up at [facility] 2 weeks post operatively for diet advancement per MD.

## 2019-01-11 NOTE — Patient Instructions (Addendum)
DUE TO COVID-19 ONLY ONE VISITOR IS ALLOWED TO COME WITH YOU AND STAY IN THE WAITING ROOM ONLY DURING PRE OP AND PROCEDURE. THE ONE VISITOR MAY VISIT WITH YOU IN YOUR PRIVATE ROOM DURING VISITING HOURS ONLY!!   COVID SWAB TESTING MUST BE COMPLETED ON:  Thursday, Oct. 22, 2020 at 10:00AM [address], [place] [facility] enter pre surgical testing line (Must self quarantine after testing. Follow instructions on handout.)             Your procedure is scheduled on: Monday, Oct. 26, 2020   [facility]    Report to admitting at 7:30 AM   Call this number if you have problems the morning of surgery [phone]   Bring CPAP mask and tubing day of surgery   NO SOLID FOOD AFTER 600 PM THE NIGHT BEFORE YOUR SURGERY.    YOU MAY DRINK CLEAR FLUIDS.   CLEAR LIQUID DIET  Foods Allowed                                                                     Foods Excluded  Water, Black Coffee and tea, regular and decaf                             liquids that you cannot  Plain Jell-O in any flavor  (No red)                                           see through such as: Fruit ices (not with fruit pulp)                                     milk, soups, orange juice  Iced Popsicles (No red)                                    All solid food Carbonated beverages, regular and diet                                    Apple juices Sports drinks like Gatorade (No red) Lightly seasoned clear broth or consume(fat free) Sugar, honey syrup  Sample Menu Breakfast                                Lunch                                     Supper Cranberry juice                    Beef broth  Chicken broth Jell-O                                     Grape juice                           Apple juice Coffee or tea                        Jell-O                                      Popsicle                                                 Coffee or tea                        Coffee or tea   THE G2 DRINK YOU DRINK BEFORE YOU LEAVE HOME WILL BE THE LAST FLUIDS YOU DRINK BEFORE SURGERY.   Brush your teeth the morning of surgery.   Do NOT smoke after Midnight   Take these medicines the morning of surgery with A SIP OF WATER: None                               You may not have any metal on your body including hair pins, jewelry, and body piercings             Do not wear make-up, lotions, powders, perfumes/cologne, or deodorant             Do not wear nail polish.  Do not shave  48 hours prior to surgery.               Do not bring valuables to the hospital. [facility] IS NOT             RESPONSIBLE   FOR VALUABLES.   Contacts, dentures or bridgework may not be worn into surgery.   Bring small overnight bag day of surgery.    Special Instructions: Bring a copy of your healthcare power of attorney and living will documents         the day of surgery if you haven't scanned them in before.   PAIN IS EXPECTED AFTER SURGERY AND WILL NOT BE COMPLETELY ELIMINATED. AMBULATION AND TYLENOL WILL HELP REDUCE INCISIONAL AND GAS PAIN. MOVEMENT IS KEY!   YOU ARE EXPECTED TO BE OUT OF BED WITHIN 4 HOURS OF ADMISSION TO YOUR PATIENT ROOM.   SITTING IN THE RECLINER THROUGHOUT THE DAY IS IMPORTANT FOR DRINKING FLUIDS AND MOVING GAS THROUGHOUT THE GI TRACT.   COMPRESSION STOCKINGS SHOULD BE WORN [facility] STAY UNLESS YOU ARE WALKING.     INCENTIVE SPIROMETER SHOULD BE USED EVERY HOUR WHILE AWAKE TO DECREASE POST-OPERATIVE COMPLICATIONS SUCH AS PNEUMONIA.   WHEN DISCHARGED HOME, IT IS IMPORTANT TO CONTINUE TO WALK EVERY HOUR AND USE THE INCENTIVE SPIROMETER EVERY HOUR.                 Please read over the following fact  sheets you were given:  [facility] - Preparing for Surgery Before surgery, you can play an important role.  Because skin is not sterile, your skin needs to be as free of germs as possible.  You can  reduce the number of germs on your skin by washing with CHG (chlorahexidine gluconate) soap before surgery.  CHG is an antiseptic cleaner which kills germs and bonds with the skin to continue killing germs even after washing. Please DO NOT use if you have an allergy to CHG or antibacterial soaps.  If your skin becomes reddened/irritated stop using the CHG and inform your nurse when you arrive at Short Stay. Do not shave (including legs and underarms) for at least 48 hours prior to the first CHG shower.  You may shave your face/neck.  Please follow these instructions carefully:  1.  Shower with CHG Soap the night before surgery and the  morning of surgery.  2.  If you choose to wash your hair, wash your hair first as usual with your normal  shampoo.  3.  After you shampoo, rinse your hair and body thoroughly to remove the shampoo.                             4.  Use CHG as you would any other liquid soap.  You can apply chg directly to the skin and wash.  Gently with a scrungie or clean washcloth.  5.  Apply the CHG Soap to your body ONLY FROM THE NECK DOWN.   Do   not use on face/ open                           Wound or open sores. Avoid contact with eyes, ears mouth and   genitals (private parts).                       Wash face,  Genitals (private parts) with your normal soap.             6.  Wash thoroughly, paying special attention to the area where your    surgery  will be performed.  7.  Thoroughly rinse your body with warm water from the neck down.  8.  DO NOT shower/wash with your normal soap after using and rinsing off the CHG Soap.                9.  Pat yourself dry with a clean towel.            10.  Wear clean pajamas.            11.  Place clean sheets on your bed the night of your first shower and do not  sleep with pets. Day of Surgery : Do not apply any lotions/deodorants the morning of surgery.  Please wear clean clothes to the hospital/surgery center.  FAILURE TO FOLLOW THESE  INSTRUCTIONS MAY RESULT IN THE CANCELLATION OF YOUR SURGERY  PATIENT SIGNATURE_________________________________  NURSE SIGNATURE__________________________________  ________________________________________________________________________

## 2019-01-12 ENCOUNTER — Other Ambulatory Visit: Payer: Self-pay

## 2019-01-12 ENCOUNTER — Encounter (HOSPITAL_COMMUNITY)
Admission: RE | Admit: 2019-01-12 | Discharge: 2019-01-12 | Disposition: A | Discharge status: Home / Self Care | Payer: BC Managed Care – PPO | Source: Ambulatory Visit | Attending: Surgery | Admitting: Surgery

## 2019-01-12 ENCOUNTER — Ambulatory Visit (HOSPITAL_COMMUNITY)
Admission: RE | Admit: 2019-01-12 | Discharge: 2019-01-12 | Disposition: A | Discharge status: Home / Self Care | Payer: BC Managed Care – PPO | Source: Ambulatory Visit | Attending: Anesthesiology | Admitting: Anesthesiology

## 2019-01-12 ENCOUNTER — Encounter (HOSPITAL_COMMUNITY): Payer: Self-pay

## 2019-01-12 DIAGNOSIS — Z01818 Encounter for other preprocedural examination: Secondary | ICD-10-CM | POA: Insufficient documentation

## 2019-01-12 HISTORY — DX: Personal history of other diseases of the digestive system: Z87.19

## 2019-01-12 HISTORY — DX: Cardiac murmur, unspecified: R01.1

## 2019-01-12 HISTORY — DX: Gastro-esophageal reflux disease without esophagitis: K21.9

## 2019-01-12 HISTORY — DX: Localized edema: R60.0

## 2019-01-12 HISTORY — DX: Obstructive sleep apnea (adult) (pediatric): G47.33

## 2019-01-12 LAB — CBC
HCT: 37.5 % (ref 36.0–46.0)
Hemoglobin: 12.4 g/dL (ref 12.0–15.0)
MCH: 23 pg — ABNORMAL LOW (ref 26.0–34.0)
MCHC: 33.1 g/dL (ref 30.0–36.0)
MCV: 69.4 fL — ABNORMAL LOW (ref 80.0–100.0)
Platelets: 335 10*3/uL (ref 150–400)
RBC: 5.4 MIL/uL — ABNORMAL HIGH (ref 3.87–5.11)
RDW: 16.4 % — ABNORMAL HIGH (ref 11.5–15.5)
WBC: 9.6 10*3/uL (ref 4.0–10.5)
nRBC: 0 % (ref 0.0–0.2)

## 2019-01-12 NOTE — Progress Notes (Signed)
SPOKE W/  Zahlia     SCREENING SYMPTOMS OF COVID 19:   COUGH--NO  RUNNY NOSE--- NO  SORE THROAT---NO  NASAL CONGESTION----NO  SNEEZING----NO  SHORTNESS OF BREATH---NO  DIFFICULTY BREATHING---NO  TEMP >100.0 -----NO  UNEXPLAINED BODY ACHES------NO  CHILLS -------- NO  HEADACHES ---------NO  LOSS OF SMELL/ TASTE --------NO    HAVE YOU OR ANY FAMILY MEMBER TRAVELLED PAST 14 DAYS OUT OF THE   COUNTY---NO STATE----NO COUNTRY----NO  HAVE YOU OR ANY FAMILY MEMBER BEEN EXPOSED TO ANYONE WITH COVID 19? NO

## 2019-01-12 NOTE — Telephone Encounter (Signed)
I spoke with Dr.Kapinos who is evidently with an insurance company who is doing a peer to peer review for her insurance disability denial.  He asked to review her case in detail with me.  I asked him  if he had the notes that I had sent to the insurance company (and we have sent these several times) and he said yes.  I asked him what specific questions he had for me outside of the notes and he again stated he want me to review the  case with him.  I told him I did not have the time to go back and review the entire case and I did not know what that would add since he had my notes.  I asked him if he had read them.  He said "I can review them".  This makes me think that he has not yet reviewed them.  I urged him to do so  And if he has  specific questions to please call me back.

## 2019-01-12 NOTE — Progress Notes (Signed)
PCP - Dr. Dorcas Mcmurray, Last office visit note with Dr. Andria Frames 09/10/2018 in epic Cardiologist - N/A  Chest x-ray - 01/12/2019 in epic EKG - 11/09/2018 in epic Stress Test - N/A ECHO - 12/02/2018 in epic Cardiac Cath -N/A   Sleep Study - 12/21/2018 in epic CPAP - awaiting machine  Fasting Blood Sugar - N/A Checks Blood Sugar __N/A___ times a day  Blood Thinner Instructions:  N/A Aspirin Instructions:N/A   Last Dose:  N/A  Anesthesia review: BMI 53.25 airway assessment  Patient denies shortness of breath, fever, cough and chest pain at PAT appointment   Patient verbalized understanding of instructions that were given to them at the PAT appointment. Patient was also instructed that they will need to review over the PAT instructions again at home before surgery.

## 2019-01-14 ENCOUNTER — Other Ambulatory Visit (HOSPITAL_COMMUNITY)
Admission: RE | Admit: 2019-01-14 | Discharge: 2019-01-14 | Disposition: A | Discharge status: Home / Self Care | Payer: BC Managed Care – PPO | Source: Ambulatory Visit | Attending: Surgery | Admitting: Surgery

## 2019-01-14 DIAGNOSIS — Z20828 Contact with and (suspected) exposure to other viral communicable diseases: Secondary | ICD-10-CM | POA: Insufficient documentation

## 2019-01-14 DIAGNOSIS — Z01812 Encounter for preprocedural laboratory examination: Secondary | ICD-10-CM | POA: Insufficient documentation

## 2019-01-15 NOTE — Progress Notes (Signed)
Left voicemail instructing patient to arrive at 11:30AM, Clear liquids until 10:30AM.

## 2019-01-15 NOTE — Progress Notes (Signed)
Sara [name] returned message to verify she is aware of time change, she stated she will arrive at 11:30AM 01/18/2019.

## 2019-01-17 LAB — NOVEL CORONAVIRUS, NAA (HOSP ORDER, SEND-OUT TO REF LAB; TAT 18-24 HRS): SARS-CoV-2, NAA: NOT DETECTED

## 2019-01-17 NOTE — H&P (Signed)
Sara [name]  Location: [facility] Patient #: [id number] DOB: 09-29-1975 Married / Language: English / Race: Black or African American Female  History of Present Illness   The patient is a 43 year old female who presents with a complaint of weight loss surgery.  The PCP is Dr. Dorcas Mcmurray [facility])  The patient was referred by Dr. Peyton Bottoms. Andria Frames.  She is by herself. We got her husband on the phone to answer questions. He was supposed to be here with her, but somehow that did not get communicated to our office.  [The Covid-19 virus has disrupted normal medical care in [place] and across the nation. We have sometimes had to alter normal surgical/medical care to limit this epidemic and we have explained these changes to the patient.]  She is here for her pre op visit. She unfortunately has gained 4 pounds since I last saw her. I discussed this with her. She is also a little bit disconnected in understanding about surgery. She is yet to see the nutritionist preop, therefore she's not on the preop diet yet. I think she understands the operation and I went over it with her again.  She saw Dr. Halford Chessman - 12/21/2018 and Dr. Loanne Drilling on 01/04/2019 - for mild OSA - she is on CPAP But she has yet to get her mask. UGI - 11/09/2018 - mild GERD, small HH - I discussed looking at the hiatus during surgery She saw Alvester Morin - 12/10/2018 - for nutrition She saw Dr. Lajuana Carry for psych eval - she said that she was only on the video for 10 minutes - and that it did not help much.  History of weight issues: She has struggled with her weight much of her adult life. She has been to her online information session and is interested in sleeve gastrectomy. She said that her aunt, Harl Bowie, had a gastric sleeve at our office, but I cannot find her name. She has tried multiple diets including: Weight Watchers, Atkins diet, Keto diet, and low calorie diet. Her best  success was with Zezitane pills. She said that you took 2 a day, she lost 80 pounds, she thinks that they were from [place], and they were taken off the market. Her main limit to activity is her right knee bothers her. She had an arthroscopy by Dr. Mardelle Matte ([id number]), but still has trouble. She is not seeing anyone right now.  Per the [facility], the patient is a candidate for bariatric surgery. The patient attended our initial information session and reviewed the types of bariatric surgery.  The patient is interested in the sleeve gastrectomy. I discussed with the patient the indications and risks of bariatric surgery. The potential risks of surgery include, but are not limited to, bleeding, infection, leak from the bowel, DVT and PE, open surgery, long term nutrition consequences, and death. The patient understands the importance of compliance and long term follow-up with our group after surgery. From here we will obtain lab tests, x-rays, nutrition consult, and psych consult.  Plan: 1. For surgery 01/19/2019  Review of Systems as stated in this history (HPI) or in the review of systems. Otherwise all other 12 point ROS are negative  Past Medical History: 1. History of depression 2. Knee issues Right knee arthroscopy - 05/05/2015 - [facility] 3. Her lower back bothers her 4. She has thought about a reduction mammoplasty She's never had a mammogram 5. Note - She had some chest pain about 4  months ago for a week or two. They tried to make an appt with cardiology, but with Covid, the appt was delayed. Her symptoms resolved. She is not HTN. There appears to be no reason for cards to see her. 6.  OSA  Social History: Married 3 children: 29 yo daughter, 37 yo son, 68 yo son (plays quarterback at [employer]) Work - she is a Engineer, civil (consulting) for [employer] - we talked about being out of work for 1 months   Allergies Maurilio Lovely, CMA; 01/08/2019 11:01 AM) No Known Allergies [10/29/2018]: No Known Drug Allergies [10/29/2018]: Allergies Reconciled   Medication History Maurilio Lovely, CMA; 01/08/2019 11:01 AM) No Current Medications Medications Reconciled  Vitals Maurilio Lovely CMA; 01/08/2019 11:03 AM) 01/08/2019 11:01 AM Weight: 350.2 lb Height: 66in Neck: 16in  Body Surface Area: 2.54 m Body Mass Index: 56.52 kg/m  Temp.: 74F(Oral)  Pulse: 98 (Regular)  BP: 136/82 (Sitting, Left Arm, Standard)   Physical Exam  General: Obese AA F who is alert and generally healthy appearing. She is wearing a mask. HEENT: Normal. Pupils equal.  Neck: Supple. No mass. No thyroid mass.  Lymph Nodes: No supraclavicular or cervical nodes.  Lungs: Clear to auscultation and symmetric breath sounds. Heart: RRR. No murmur or rub.  Abdomen: Soft. No mass. No tenderness. No hernia. Normal bowel sounds.  She is more pear than apple. Pfannenstiel incision from C sections Rectal: Not done.  Extremities: Good strength and ROM in upper and lower extremities. She has a lot of little scars on her arms, so on her abdomen - she said that she had something a couple of years ago with chiggers.  Neurologic: Grossly intact to motor and sensory function. Psychiatric: Has normal mood and affect. Behavior is normal.   Assessment & Plan  1.  OBESITY, MORBID, BMI 50 OR HIGHER (E66.01)  Plan:   1. Sleeve gastrectomy  scheduled for 01/19/2019  2. I discussed possible HH repair   3. She has yet to have her pre op nutrition visit  4. To send in Protonix and Zofran  2. History of depression 3. Knee issues Right knee arthroscopy - 05/05/2015 - [facility] 4. Her lower back bothers her 5.  OSA   Ovidio Kin, MD, [facility] Office phone:  [phone]

## 2019-01-18 ENCOUNTER — Observation Stay (HOSPITAL_COMMUNITY)
Admission: RE | Admit: 2019-01-18 | Discharge: 2019-01-19 | Disposition: A | Discharge status: Home / Self Care | Payer: BC Managed Care – PPO | Source: Other Acute Inpatient Hospital | Attending: Surgery | Admitting: Surgery

## 2019-01-18 ENCOUNTER — Other Ambulatory Visit: Payer: Self-pay

## 2019-01-18 ENCOUNTER — Encounter (HOSPITAL_COMMUNITY)
Admission: RE | Disposition: A | Discharge status: Home / Self Care | Payer: Self-pay | Source: Other Acute Inpatient Hospital | Attending: Surgery

## 2019-01-18 ENCOUNTER — Inpatient Hospital Stay (HOSPITAL_COMMUNITY): Payer: BC Managed Care – PPO | Admitting: Physician Assistant

## 2019-01-18 ENCOUNTER — Inpatient Hospital Stay (HOSPITAL_COMMUNITY): Payer: BC Managed Care – PPO | Admitting: Certified Registered Nurse Anesthetist

## 2019-01-18 ENCOUNTER — Encounter (HOSPITAL_COMMUNITY): Payer: Self-pay | Admitting: *Deleted

## 2019-01-18 ENCOUNTER — Ambulatory Visit (HOSPITAL_COMMUNITY): Payer: Self-pay | Admitting: Surgery

## 2019-01-18 DIAGNOSIS — G4733 Obstructive sleep apnea (adult) (pediatric): Secondary | ICD-10-CM | POA: Diagnosis not present

## 2019-01-18 DIAGNOSIS — K660 Peritoneal adhesions (postprocedural) (postinfection): Secondary | ICD-10-CM | POA: Diagnosis not present

## 2019-01-18 DIAGNOSIS — K449 Diaphragmatic hernia without obstruction or gangrene: Secondary | ICD-10-CM | POA: Diagnosis not present

## 2019-01-18 DIAGNOSIS — K219 Gastro-esophageal reflux disease without esophagitis: Secondary | ICD-10-CM | POA: Diagnosis not present

## 2019-01-18 DIAGNOSIS — Z6841 Body Mass Index (BMI) 40.0 and over, adult: Secondary | ICD-10-CM | POA: Insufficient documentation

## 2019-01-18 DIAGNOSIS — R634 Abnormal weight loss: Secondary | ICD-10-CM | POA: Diagnosis present

## 2019-01-18 DIAGNOSIS — S36409A Unspecified injury of unspecified part of small intestine, initial encounter: Secondary | ICD-10-CM | POA: Diagnosis not present

## 2019-01-18 HISTORY — PX: LAPAROSCOPIC ABDOMINAL EXPLORATION: SHX6249

## 2019-01-18 LAB — PREGNANCY, URINE: Preg Test, Ur: NEGATIVE

## 2019-01-18 SURGERY — EXPLORATION, ABDOMEN, LAPAROSCOPIC
Anesthesia: General | Site: Abdomen

## 2019-01-18 MED ORDER — SODIUM CHLORIDE 0.9 % IV SOLN
INTRAVENOUS | Status: AC
  Filled 2019-01-18: qty 2

## 2019-01-18 MED ORDER — LACTATED RINGERS IR SOLN
Status: DC | PRN
  Administered 2019-01-18: 1000 mL

## 2019-01-18 MED ORDER — STERILE WATER FOR IRRIGATION IR SOLN
Status: DC | PRN
  Administered 2019-01-18: 1000 mL

## 2019-01-18 MED ORDER — LIDOCAINE 2% (20 MG/ML) 5 ML SYRINGE
INTRAMUSCULAR | Status: DC | PRN
  Administered 2019-01-18 (×2): 100 mg via INTRAVENOUS

## 2019-01-18 MED ORDER — HYDROMORPHONE HCL 1 MG/ML IJ SOLN
INTRAMUSCULAR | Status: AC
  Filled 2019-01-18: qty 1

## 2019-01-18 MED ORDER — MEPERIDINE HCL 50 MG/ML IJ SOLN: 6.2500 mg | INTRAMUSCULAR | Status: DC | PRN

## 2019-01-18 MED ORDER — OXYCODONE HCL 5 MG/5ML PO SOLN
5.0000 mg | Freq: Four times a day (QID) | ORAL | Status: DC | PRN
  Administered 2019-01-19: 5 mg via ORAL
  Filled 2019-01-18: qty 5

## 2019-01-18 MED ORDER — ACETAMINOPHEN 160 MG/5ML PO SOLN
1000.0000 mg | Freq: Three times a day (TID) | ORAL | Status: DC
  Administered 2019-01-19: 13:00:00 1000 mg via ORAL
  Filled 2019-01-18: qty 40.6

## 2019-01-18 MED ORDER — KCL IN DEXTROSE-NACL 20-5-0.45 MEQ/L-%-% IV SOLN
INTRAVENOUS | Status: DC
  Administered 2019-01-18 – 2019-01-19 (×2): via INTRAVENOUS
  Filled 2019-01-18 (×2): qty 1000

## 2019-01-18 MED ORDER — ROCURONIUM BROMIDE 50 MG/5ML IV SOSY
PREFILLED_SYRINGE | INTRAVENOUS | Status: DC | PRN
  Administered 2019-01-18: 20 mg via INTRAVENOUS
  Administered 2019-01-18: 10 mg via INTRAVENOUS
  Administered 2019-01-18: 60 mg via INTRAVENOUS

## 2019-01-18 MED ORDER — SCOPOLAMINE 1 MG/3DAYS TD PT72
1.0000 | MEDICATED_PATCH | TRANSDERMAL | Status: DC
  Administered 2019-01-18: 13:00:00 1.5 mg via TRANSDERMAL

## 2019-01-18 MED ORDER — ACETAMINOPHEN 500 MG PO TABS
1000.0000 mg | ORAL_TABLET | Freq: Once | ORAL | Status: AC
  Administered 2019-01-18: 13:00:00 1000 mg via ORAL

## 2019-01-18 MED ORDER — SCOPOLAMINE 1 MG/3DAYS TD PT72
MEDICATED_PATCH | TRANSDERMAL | Status: AC
  Administered 2019-01-18: 13:00:00 1.5 mg via TRANSDERMAL
  Filled 2019-01-18: qty 1

## 2019-01-18 MED ORDER — SUCCINYLCHOLINE CHLORIDE 200 MG/10ML IV SOSY
PREFILLED_SYRINGE | INTRAVENOUS | Status: AC
  Filled 2019-01-18: qty 10

## 2019-01-18 MED ORDER — GABAPENTIN 300 MG PO CAPS
300.0000 mg | ORAL_CAPSULE | Freq: Once | ORAL | Status: AC
  Administered 2019-01-18: 13:00:00 300 mg via ORAL

## 2019-01-18 MED ORDER — LACTATED RINGERS IV SOLN
INTRAVENOUS | Status: DC
  Administered 2019-01-18 (×2): via INTRAVENOUS

## 2019-01-18 MED ORDER — BUPIVACAINE LIPOSOME 1.3 % IJ SUSP
20.0000 mL | Freq: Once | INTRAMUSCULAR | Status: AC
  Administered 2019-01-18: 20 mL
  Filled 2019-01-18: qty 20

## 2019-01-18 MED ORDER — DEXAMETHASONE SODIUM PHOSPHATE 10 MG/ML IJ SOLN
INTRAMUSCULAR | Status: AC
  Filled 2019-01-18: qty 1

## 2019-01-18 MED ORDER — KETAMINE HCL 10 MG/ML IJ SOLN
INTRAMUSCULAR | Status: DC | PRN
  Administered 2019-01-18: 30 mg via INTRAVENOUS

## 2019-01-18 MED ORDER — SODIUM CHLORIDE 0.9 % IV SOLN
2.0000 g | Freq: Once | INTRAVENOUS | Status: AC
  Administered 2019-01-18: 14:00:00 2 g via INTRAVENOUS

## 2019-01-18 MED ORDER — LIDOCAINE 2% (20 MG/ML) 5 ML SYRINGE
INTRAMUSCULAR | Status: AC
  Filled 2019-01-18: qty 5

## 2019-01-18 MED ORDER — DEXAMETHASONE SODIUM PHOSPHATE 10 MG/ML IJ SOLN
INTRAMUSCULAR | Status: DC | PRN
  Administered 2019-01-18: 6 mg via INTRAVENOUS

## 2019-01-18 MED ORDER — FENTANYL CITRATE (PF) 250 MCG/5ML IJ SOLN
INTRAMUSCULAR | Status: AC
  Filled 2019-01-18: qty 5

## 2019-01-18 MED ORDER — ACETAMINOPHEN 500 MG PO TABS
ORAL_TABLET | ORAL | Status: AC
  Administered 2019-01-18: 1000 mg via ORAL
  Filled 2019-01-18: qty 2

## 2019-01-18 MED ORDER — ACETAMINOPHEN 500 MG PO TABS
1000.0000 mg | ORAL_TABLET | Freq: Three times a day (TID) | ORAL | Status: DC
  Administered 2019-01-18 – 2019-01-19 (×2): 1000 mg via ORAL
  Filled 2019-01-18 (×2): qty 2

## 2019-01-18 MED ORDER — TISSEEL VH 10 ML EX KIT
PACK | CUTANEOUS | Status: AC
  Filled 2019-01-18: qty 1

## 2019-01-18 MED ORDER — 0.9 % SODIUM CHLORIDE (POUR BTL) OPTIME
TOPICAL | Status: DC | PRN
  Administered 2019-01-18: 1000 mL

## 2019-01-18 MED ORDER — SUGAMMADEX SODIUM 500 MG/5ML IV SOLN
INTRAVENOUS | Status: AC
  Filled 2019-01-18: qty 5

## 2019-01-18 MED ORDER — PROPOFOL 10 MG/ML IV BOLUS
INTRAVENOUS | Status: DC | PRN
  Administered 2019-01-18: 200 mg via INTRAVENOUS

## 2019-01-18 MED ORDER — SUCCINYLCHOLINE CHLORIDE 200 MG/10ML IV SOSY
PREFILLED_SYRINGE | INTRAVENOUS | Status: DC | PRN
  Administered 2019-01-18: 170 mg via INTRAVENOUS

## 2019-01-18 MED ORDER — PANTOPRAZOLE SODIUM 40 MG IV SOLR
40.0000 mg | Freq: Every day | INTRAVENOUS | Status: DC
  Administered 2019-01-18: 40 mg via INTRAVENOUS
  Filled 2019-01-18: qty 40

## 2019-01-18 MED ORDER — ENSURE MAX PROTEIN PO LIQD: 2.0000 [oz_av] | ORAL | Status: DC

## 2019-01-18 MED ORDER — MORPHINE SULFATE (PF) 2 MG/ML IV SOLN
1.0000 mg | INTRAVENOUS | Status: DC | PRN
  Administered 2019-01-19: 2 mg via INTRAVENOUS
  Filled 2019-01-18: qty 1

## 2019-01-18 MED ORDER — GABAPENTIN 300 MG PO CAPS
ORAL_CAPSULE | ORAL | Status: AC
  Administered 2019-01-18: 300 mg via ORAL
  Filled 2019-01-18: qty 1

## 2019-01-18 MED ORDER — MIDAZOLAM HCL 5 MG/5ML IJ SOLN
INTRAMUSCULAR | Status: DC | PRN
  Administered 2019-01-18: 2 mg via INTRAVENOUS

## 2019-01-18 MED ORDER — PROMETHAZINE HCL 25 MG/ML IJ SOLN: 6.2500 mg | INTRAMUSCULAR | Status: DC | PRN

## 2019-01-18 MED ORDER — HYDROMORPHONE HCL 1 MG/ML IJ SOLN
0.2500 mg | INTRAMUSCULAR | Status: DC | PRN
  Administered 2019-01-18 (×2): 0.25 mg via INTRAVENOUS

## 2019-01-18 MED ORDER — ENOXAPARIN SODIUM 30 MG/0.3ML ~~LOC~~ SOLN
30.0000 mg | Freq: Two times a day (BID) | SUBCUTANEOUS | Status: DC
  Administered 2019-01-18 – 2019-01-19 (×2): 30 mg via SUBCUTANEOUS
  Filled 2019-01-18 (×2): qty 0.3

## 2019-01-18 MED ORDER — PROPOFOL 10 MG/ML IV BOLUS
INTRAVENOUS | Status: AC
  Filled 2019-01-18: qty 20

## 2019-01-18 MED ORDER — FENTANYL CITRATE (PF) 250 MCG/5ML IJ SOLN
INTRAMUSCULAR | Status: DC | PRN
  Administered 2019-01-18: 50 ug via INTRAVENOUS
  Administered 2019-01-18: 100 ug via INTRAVENOUS
  Administered 2019-01-18: 50 ug via INTRAVENOUS

## 2019-01-18 MED ORDER — BUPIVACAINE HCL (PF) 0.25 % IJ SOLN
INTRAMUSCULAR | Status: DC | PRN
  Administered 2019-01-18: 30 mL

## 2019-01-18 MED ORDER — MIDAZOLAM HCL 2 MG/2ML IJ SOLN
INTRAMUSCULAR | Status: AC
  Filled 2019-01-18: qty 2

## 2019-01-18 MED ORDER — BUPIVACAINE HCL (PF) 0.25 % IJ SOLN
INTRAMUSCULAR | Status: AC
  Filled 2019-01-18: qty 30

## 2019-01-18 MED ORDER — ROCURONIUM BROMIDE 10 MG/ML (PF) SYRINGE
PREFILLED_SYRINGE | INTRAVENOUS | Status: AC
  Filled 2019-01-18: qty 10

## 2019-01-18 MED ORDER — ONDANSETRON HCL 4 MG/2ML IJ SOLN
4.0000 mg | INTRAMUSCULAR | Status: DC | PRN
  Administered 2019-01-18: 4 mg via INTRAVENOUS
  Filled 2019-01-18: qty 2

## 2019-01-18 MED ORDER — SUGAMMADEX SODIUM 200 MG/2ML IV SOLN
INTRAVENOUS | Status: DC | PRN
  Administered 2019-01-18: 350 mg via INTRAVENOUS

## 2019-01-18 MED ORDER — ONDANSETRON HCL 4 MG/2ML IJ SOLN
INTRAMUSCULAR | Status: AC
  Filled 2019-01-18: qty 2

## 2019-01-18 MED ORDER — LIDOCAINE 2% (20 MG/ML) 5 ML SYRINGE
INTRAMUSCULAR | Status: DC | PRN
  Administered 2019-01-18: 1.5 mg/kg/h via INTRAVENOUS

## 2019-01-18 MED ORDER — ONDANSETRON HCL 4 MG/2ML IJ SOLN
INTRAMUSCULAR | Status: DC | PRN
  Administered 2019-01-18: 4 mg via INTRAVENOUS

## 2019-01-18 SURGICAL SUPPLY — 78 items
ADH SKN CLS APL DERMABOND .7 (GAUZE/BANDAGES/DRESSINGS) ×4
APL PRP STRL LF DISP 70% ISPRP (MISCELLANEOUS) ×4
APL SRG 32X5 SNPLK LF DISP (MISCELLANEOUS)
APL SWBSTK 6 STRL LF DISP (MISCELLANEOUS)
APPLICATOR COTTON TIP 6 STRL (MISCELLANEOUS) IMPLANT
APPLICATOR COTTON TIP 6IN STRL (MISCELLANEOUS)
APPLIER CLIP ROT 10 11.4 M/L (STAPLE)
APPLIER CLIP ROT 13.4 12 LRG (CLIP)
APR CLP LRG 13.4X12 ROT 20 MLT (CLIP)
APR CLP MED LRG 11.4X10 (STAPLE)
BLADE SURG 15 STRL LF DISP TIS (BLADE) ×2 IMPLANT
BLADE SURG 15 STRL SS (BLADE) ×3
CABLE HIGH FREQUENCY MONO STRZ (ELECTRODE) IMPLANT
CHLORAPREP W/TINT 26 (MISCELLANEOUS) ×5 IMPLANT
CLIP APPLIE ROT 10 11.4 M/L (STAPLE) IMPLANT
CLIP APPLIE ROT 13.4 12 LRG (CLIP) IMPLANT
COVER WAND RF STERILE (DRAPES) IMPLANT
DERMABOND ADVANCED (GAUZE/BANDAGES/DRESSINGS) ×2
DERMABOND ADVANCED .7 DNX12 (GAUZE/BANDAGES/DRESSINGS) ×3 IMPLANT
DEVICE SUT QUICK LOAD TK 5 (STAPLE) IMPLANT
DEVICE SUT TI-KNOT TK 5X26 (MISCELLANEOUS) IMPLANT
DEVICE SUTURE ENDOST 10MM (ENDOMECHANICALS) IMPLANT
DISSECTOR BLUNT TIP ENDO 5MM (MISCELLANEOUS) IMPLANT
DRAPE UTILITY XL STRL (DRAPES) ×6 IMPLANT
ELECT PENCIL ROCKER SW 15FT (MISCELLANEOUS) ×2 IMPLANT
ELECT REM PT RETURN 15FT ADLT (MISCELLANEOUS) ×3 IMPLANT
GLOVE BIO SURGEON STRL SZ 6 (GLOVE) ×2 IMPLANT
GLOVE BIOGEL M 8.0 STRL (GLOVE) ×2 IMPLANT
GLOVE BIOGEL PI IND STRL 6.5 (GLOVE) ×1 IMPLANT
GLOVE BIOGEL PI IND STRL 7.0 (GLOVE) ×1 IMPLANT
GLOVE BIOGEL PI INDICATOR 6.5 (GLOVE) ×1
GLOVE BIOGEL PI INDICATOR 7.0 (GLOVE) ×1
GLOVE SURG SS PI 7.0 STRL IVOR (GLOVE) ×2 IMPLANT
GLOVE SURG SYN 7.5  E (GLOVE) ×1
GLOVE SURG SYN 7.5 E (GLOVE) ×2 IMPLANT
GLOVE SURG SYN 7.5 PF PI (GLOVE) ×1 IMPLANT
GOWN STRL REUS W/TWL XL LVL3 (GOWN DISPOSABLE) ×11 IMPLANT
GRASPER SUT TROCAR 14GX15 (MISCELLANEOUS) IMPLANT
HOVERMATT SINGLE USE (MISCELLANEOUS) ×3 IMPLANT
KIT BASIN OR (CUSTOM PROCEDURE TRAY) ×3 IMPLANT
KIT TURNOVER KIT A (KITS) IMPLANT
MARKER SKIN DUAL TIP RULER LAB (MISCELLANEOUS) ×3 IMPLANT
NDL SPNL 22GX3.5 QUINCKE BK (NEEDLE) ×1 IMPLANT
NEEDLE SPNL 22GX3.5 QUINCKE BK (NEEDLE) ×3 IMPLANT
PACK UNIVERSAL I (CUSTOM PROCEDURE TRAY) ×3 IMPLANT
RELOAD STAPLE 60 3.6 BLU REG (STAPLE) IMPLANT
RELOAD STAPLE 60 3.8 GOLD REG (STAPLE) IMPLANT
RELOAD STAPLE 60 4.1 GRN THCK (STAPLE) ×2 IMPLANT
RELOAD STAPLER BLUE 60MM (STAPLE) IMPLANT
RELOAD STAPLER GOLD 60MM (STAPLE) IMPLANT
RELOAD STAPLER GREEN 60MM (STAPLE) IMPLANT
SCISSORS LAP 5X35 DISP (ENDOMECHANICALS) ×1 IMPLANT
SEALANT SURGICAL APPL DUAL CAN (MISCELLANEOUS) ×1 IMPLANT
SET IRRIG TUBING LAPAROSCOPIC (IRRIGATION / IRRIGATOR) ×3 IMPLANT
SET TUBE SMOKE EVAC HIGH FLOW (TUBING) ×3 IMPLANT
SHEARS HARMONIC ACE PLUS 45CM (MISCELLANEOUS) ×3 IMPLANT
SLEEVE ADV FIXATION 5X100MM (TROCAR) ×6 IMPLANT
SLEEVE GASTRECTOMY 36FR VISIGI (MISCELLANEOUS) ×1 IMPLANT
SOL ANTI FOG 6CC (MISCELLANEOUS) ×2 IMPLANT
SOLUTION ANTI FOG 6CC (MISCELLANEOUS) ×1
SPONGE LAP 18X18 RF (DISPOSABLE) ×3 IMPLANT
STAPLER ECHELON LONG 60 440 (INSTRUMENTS) ×1 IMPLANT
STAPLER RELOAD BLUE 60MM (STAPLE)
STAPLER RELOAD GOLD 60MM (STAPLE)
STAPLER RELOAD GREEN 60MM (STAPLE)
SUT MNCRL AB 4-0 PS2 18 (SUTURE) ×3 IMPLANT
SUT SURGIDAC NAB ES-9 0 48 120 (SUTURE) IMPLANT
SUT VIC AB 2-0 SH 18 (SUTURE) ×2 IMPLANT
SUT VICRYL 0 TIES 12 18 (SUTURE) IMPLANT
SYR 10ML ECCENTRIC (SYRINGE) ×3 IMPLANT
SYR CONTROL 10ML LL (SYRINGE) ×3 IMPLANT
TOWEL OR 17X26 10 PK STRL BLUE (TOWEL DISPOSABLE) ×3 IMPLANT
TOWEL OR NON WOVEN STRL DISP B (DISPOSABLE) ×3 IMPLANT
TROCAR ADV FIXATION 12X100MM (TROCAR) ×3 IMPLANT
TROCAR ADV FIXATION 5X100MM (TROCAR) ×3 IMPLANT
TROCAR BLADELESS 15MM (ENDOMECHANICALS) ×3 IMPLANT
TROCAR BLADELESS OPT 5 100 (ENDOMECHANICALS) ×3 IMPLANT
TUBING CONNECTING 10 (TUBING) ×1 IMPLANT

## 2019-01-18 NOTE — Op Note (Signed)
01/18/2019  3:59 PM  PATIENT:  Sara [name], 43 y.o., female, MRN: [id number]  PREOP DIAGNOSIS:  Morbid Obesity, OSA  POSTOP DIAGNOSIS:   Morbid obesity, possible small bowel injury  PROCEDURE:   Procedure(s): LAPAROSCOPIC EXPLORATION  SURGEON:   [name], M.D.  Terrence DupontRockne Coons, M.D.  ANESTHESIA:   general  Anesthesiologist: Nolon Nations, MD CRNA: Maxwell Caul, CRNA; West Pugh, CRNA  General  EBL:  minimal  ml  BLOOD ADMINISTERED: none  DRAINS: none   LOCAL MEDICATIONS USED:   20 cc of Exparel and 30 cc of 1/4% marcaine  SPECIMEN:   none  COUNTS CORRECT:  YES  INDICATIONS FOR PROCEDURE:  Sara [name] is a 43 y.o. (DOB: 11/19/1975) AA female whose primary care physician is Dickie La, MD and comes for laparoscopic sleeve gastrectomy.   The indications and risks of the surgery were explained to the patient.  The risks include, but are not limited to, infection, bleeding, and nerve injury.  PROCEDURE: The patient was taken to room #1 at [facility] and underwent a general anesthesia.  Her abdomen was prepped with ChloraPrep and sterilely draped.  A timeout was held the surgical checklist run.  I accessed her abdominal cavity through a 5 mm Ethicon Optiview in the left upper quadrant.  However, on accessing the abdominal cavity, I was concerned about a potential bowel injury.  I backed out the trocar to insufflate the abdomen.  I placed 5 additional trocars.  I placed a 5 mm trocar in the left lateral abdomen, a 12 mm trocar in the right paramedian, a 5 mm trocar in the right upper quadrant, a 5 mm to the left of midline, and a 5 mm trocar in the left lower quadrant.  She had adhesions of omentum just below the umbilicus that I took down with the harmonic scalpel.  I spent over the next hour trying to find a colon or small bowel injury.  I was able to expose the colon well, particularly the transverse colon.  I saw no injury.  I ran  the small bowel from the ligament of Treitz to the terminal ileum at least 5 times.  I found 2 injuries in the mesentery of the small bowel, but these injuries were not near the bowel lumen itself.  There was no obvious evidence of small bowel injury.  There was no obvious evidence of small bowel or colon spillage.  I even decompressed the abdomen for 10 minutes and waited to see if there would be spillage of enteric contents.   I re-laparoscoped the patient, ran the bowel, and saw no small enteric contents.  So after exploring her bowel multiple times for potential small bowel injury, I could find none.  I still thought that she had a 10% chance that there was a small bowel injury that I missed.  I was very hesitant to proceed with a laparoscopic sleeve gastrectomy in the face of this potential injury.  And therefore I stopped the operation after the multiple explorations of the small bowel.  I placed a tap block on both lateral abdominal walls.  I placed a block around each trocar site.  The trochars were removed in turn.  Each trocar site was closed with 4-0 Monocryl suture and pain with Dermabond.  The patient tolerated procedure well was transferred to recovery in good condition.  [name], MD, [facility] Office phone:  [phone]

## 2019-01-18 NOTE — Transfer of Care (Signed)
Immediate Anesthesia Transfer of Care Note  Patient: Sara [name]  Procedure(s) Performed: LAPAROSCOPIC EXPLORATION (N/A Abdomen)  Patient Location: PACU  Anesthesia Type:General  Level of Consciousness: awake, alert , oriented and patient cooperative  Airway & Oxygen Therapy: Patient Spontanous Breathing and Patient connected to face mask oxygen  Post-op Assessment: Report given to RN and Post -op Vital signs reviewed and stable  Post vital signs: Reviewed and stable  Last Vitals:  Vitals Value Taken Time  BP    Temp    Pulse    Resp    SpO2      Last Pain:  Vitals:   01/18/19 1217  TempSrc: Oral         Complications: No apparent anesthesia complications

## 2019-01-18 NOTE — Anesthesia Preprocedure Evaluation (Signed)
Anesthesia Evaluation  Patient identified by MRN, date of birth, ID band Patient awake    Reviewed: Allergy & Precautions, NPO status , Patient's Chart, lab work & pertinent test results  Airway Mallampati: II  TM Distance: >3 FB Neck ROM: Full    Dental no notable dental hx. (+) Dental Advisory Given   Pulmonary sleep apnea ,    Pulmonary exam normal breath sounds clear to auscultation       Cardiovascular Normal cardiovascular exam+ Valvular Problems/Murmurs  Rhythm:Regular Rate:Normal  Echo 11/2018  1. The left ventricle has normal systolic function with an ejection fraction of 60-65%. The cavity size was mildly dilated. Left ventricular diastolic parameters were normal. No evidence of left ventricular regional wall motion abnormalities.  2. The right ventricle has normal systolic function. The cavity was normal. There is no increase in right ventricular wall thickness. Right ventricular systolic pressure could not be assessed.  3. The aorta is normal unless otherwise noted.  4. Trivial pulmonic regurgitation noted by colorflow doppler   Neuro/Psych PSYCHIATRIC DISORDERS Depression  Neuromuscular disease    GI/Hepatic Neg liver ROS, hiatal hernia, GERD  ,  Endo/Other  Morbid obesity  Renal/GU negative Renal ROS     Musculoskeletal  (+) Arthritis ,   Abdominal   Peds  Hematology  (+) Blood dyscrasia, anemia ,   Anesthesia Other Findings   Reproductive/Obstetrics negative OB ROS                             Anesthesia Physical  Anesthesia Plan  ASA: III  Anesthesia Plan: General   Post-op Pain Management:    Induction: Intravenous  PONV Risk Score and Plan: 4 or greater and Ondansetron, Treatment may vary due to age or medical condition, Dexamethasone and Midazolam  Airway Management Planned: Oral ETT  Additional Equipment: None  Intra-op Plan:   Post-operative Plan: Extubation  in OR  Informed Consent: I have reviewed the patients History and Physical, chart, labs and discussed the procedure including the risks, benefits and alternatives for the proposed anesthesia with the patient or authorized representative who has indicated his/her understanding and acceptance.     Dental advisory given  Plan Discussed with: CRNA  Anesthesia Plan Comments:         Anesthesia Quick Evaluation

## 2019-01-18 NOTE — Progress Notes (Signed)
Patient declines nocturnal CPAP at this time. She prefers to wait until she receives her home machine to begin using so that she can get used to wearing it. She does not think initial use will be tolerated easily for her and that attempts made for use while admitted will only hinder her success at home. RT will continue to follow.

## 2019-01-18 NOTE — Anesthesia Postprocedure Evaluation (Signed)
Anesthesia Post Note  Patient: Sara [name]  Procedure(s) Performed: LAPAROSCOPIC EXPLORATION (N/A Abdomen)     Patient location during evaluation: PACU Anesthesia Type: General Level of consciousness: sedated and patient cooperative Pain management: pain level controlled Vital Signs Assessment: post-procedure vital signs reviewed and stable Respiratory status: spontaneous breathing Cardiovascular status: stable Anesthetic complications: no    Last Vitals:  Vitals:   01/18/19 1728 01/18/19 1731  BP: (!) 159/106 (!) 145/92  Pulse: 98 92  Resp: 20   Temp: 36.7 C   SpO2: 100%     Last Pain:  Vitals:   01/18/19 1728  TempSrc: Oral                 Nolon Nations

## 2019-01-18 NOTE — Interval H&P Note (Signed)
History and Physical Interval Note:  01/18/2019 1:08 PM  Sara [name]  has presented today for surgery, with the diagnosis of Morbid Obesity, OSA.  The various methods of treatment have been discussed with the patient and family.   Her husband is here with her.  After consideration of risks, benefits and other options for treatment, the patient has consented to  Procedure(s): LAPAROSCOPIC GASTRIC SLEEVE RESECTION, Upper Endo, ERAS Pathway (N/A) as a surgical intervention.  The patient's history has been reviewed, patient examined, no change in status, stable for surgery.  I have reviewed the patient's chart and labs.  Questions were answered to the patient's satisfaction.     Shann Medal

## 2019-01-18 NOTE — Progress Notes (Signed)
PHARMACY CONSULT FOR:  Risk Assessment for Post-Discharge VTE Following Bariatric Surgery  Post-Discharge VTE Risk Assessment: This patient's probability of 30-day post-discharge VTE is increased due to the factors marked:   Female    Age >/=60 years   x BMI >/=50 kg/m2    CHF    Dyspnea at Rest    Paraplegia  x  Non-gastric-band surgery    Operation Time >/=3 hr    Return to OR     Length of Stay >/= 3 d      Hx of VTE   Hypercoagulable condition   Significant venous stasis   Predicted probability of 30-day post-discharge VTE: 0.27%  Other patient-specific factors to consider: none   Recommendation for Discharge: No pharmacologic prophylaxis post-discharge     Sara [name] is a 43 y.o. female who underwent laparoscopic sleeve gastrectomy on 01/18/19    Case start: 1405 Case end: 1549   Allergies  Allergen Reactions  . Pork-Derived Products Other (See Comments)    Religious belief    Patient Measurements: Weight: (!) 340 lb 6.4 oz (154.4 kg) Body mass index is 53.31 kg/m.  No results for input(s): WBC, HGB, HCT, PLT, APTT, CREATININE, LABCREA, CREATININE, CREAT24HRUR, MG, PHOS, ALBUMIN, PROT, ALBUMIN, AST, ALT, ALKPHOS, BILITOT, BILIDIR, IBILI in the last 72 hours. CrCl cannot be calculated (Patient's most recent lab result is older than the maximum 21 days allowed.).    Past Medical History:  Diagnosis Date  . ABDOMINAL PAIN 07/04/2009   Qualifier: Diagnosis of  By: [place] MD, Sarah    . ALLERGIC RHINITIS 07/06/2008   Qualifier: Diagnosis of  By: Brigitte Pulse MD, Kimberlee    . Arthritis   . Bilateral leg numbness 07/2016  . Bulimia nervosa 08/22/2009   Qualifier: Diagnosis of  By: [place] MD, Sarah    . 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 Class 3 obesity without serious comorbidity with body mass index (BMI) of 50.0 to 59.9 in adult   . DEPRESSION, SEVERE 08/09/2009   Qualifier: Diagnosis of  By: [place] MD, Sarah    . ECZEMA, ATOPIC 08/30/2009   Qualifier:  Diagnosis of  By: [place] MD, Sarah    . GERD (gastroesophageal reflux disease)    Mild  . GRIEF REACTION, ACUTE 07/04/2009   Qualifier: Diagnosis of  By: [place] MD, Sarah    . Heart murmur   . Hereditary and idiopathic peripheral neuropathy 07/23/2016   01/12/2019 not currently having problems  . History of hiatal hernia    Small  . Iron deficiency anemia 07/2016  . KNEE PAIN, RIGHT 11/01/2008   Qualifier: Diagnosis of  By: Oneida Alar MD, KARL    . Lower extremity edema   . MUSCLE CRAMPS 07/04/2009   Qualifier: Diagnosis of  By: [place] MD, Sarah    . OSA (obstructive sleep apnea)    recent diagnosis, no CPAP received at this time   . Pallor 07/17/2009   Qualifier: Diagnosis of  By: [place] MD, Sarah    . Paresthesia 09/04/2016  . Plantar fasciitis, left 09/10/2011  . 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 PRURITUS, EARS 07/06/2008   Qualifier: Diagnosis of  By: Brigitte Pulse MD, Kimberlee    . Severe obesity (BMI >= 40) ([place]) 07/06/2008   Qualifier: Diagnosis of  By: Brigitte Pulse MD, Kimberlee    . SUBACROMIAL BURSITIS, RIGHT 07/04/2009   Qualifier: Diagnosis of  By: Sarita Haver  MD, Coralyn Mark       Medications  Prior to Admission  Medication Sig Dispense Refill Last Dose  . acetaminophen (TYLENOL) 500 MG tablet Take 1,000 mg by mouth every 6 (six) hours as needed for moderate pain or headache.   Past Week at Unknown time  . furosemide (LASIX) 20 MG tablet Take 1 tablet (20 mg total) by mouth daily. (Patient taking differently: Take 20 mg by mouth 2 (two) times daily. ) 30 tablet 3 01/17/2019 at Unknown time       Berkley Harvey 01/18/2019,6:28 PM

## 2019-01-18 NOTE — Anesthesia Procedure Notes (Signed)
Procedure Name: Intubation Date/Time: 01/18/2019 1:50 PM Performed by: West Pugh, CRNA Pre-anesthesia Checklist: Patient identified, Emergency Drugs available, Suction available, Patient being monitored and Timeout performed Patient Re-evaluated:Patient Re-evaluated prior to induction Oxygen Delivery Method: Circle system utilized Preoxygenation: Pre-oxygenation with 100% oxygen Induction Type: IV induction Laryngoscope Size: Mac and 4 Grade View: Grade I Tube type: Oral Tube size: 7.0 mm Number of attempts: 1 Airway Equipment and Method: Stylet Placement Confirmation: ETT inserted through vocal cords under direct vision,  positive ETCO2,  CO2 detector and breath sounds checked- equal and bilateral Secured at: 22 cm Tube secured with: Tape Dental Injury: Teeth and Oropharynx as per pre-operative assessment  Comments: AOI by Mickel Baas Peters,CRNA.

## 2019-01-19 ENCOUNTER — Encounter (HOSPITAL_COMMUNITY): Payer: Self-pay | Admitting: Surgery

## 2019-01-19 DIAGNOSIS — G4733 Obstructive sleep apnea (adult) (pediatric): Secondary | ICD-10-CM | POA: Diagnosis not present

## 2019-01-19 DIAGNOSIS — K660 Peritoneal adhesions (postprocedural) (postinfection): Secondary | ICD-10-CM | POA: Diagnosis not present

## 2019-01-19 DIAGNOSIS — Z6841 Body Mass Index (BMI) 40.0 and over, adult: Secondary | ICD-10-CM | POA: Diagnosis not present

## 2019-01-19 LAB — CBC WITH DIFFERENTIAL/PLATELET
Abs Immature Granulocytes: 0.09 10*3/uL — ABNORMAL HIGH (ref 0.00–0.07)
Abs Immature Granulocytes: 0.05 10*3/uL (ref 0.00–0.07)
Basophils Absolute: 0 10*3/uL (ref 0.0–0.1)
Basophils Absolute: 0 10*3/uL (ref 0.0–0.1)
Basophils Relative: 0 %
Basophils Relative: 0 %
Eosinophils Absolute: 0 10*3/uL (ref 0.0–0.5)
Eosinophils Absolute: 0 10*3/uL (ref 0.0–0.5)
Eosinophils Relative: 0 %
Eosinophils Relative: 0 %
HCT: 32.1 % — ABNORMAL LOW (ref 36.0–46.0)
HCT: 31.4 % — ABNORMAL LOW (ref 36.0–46.0)
Hemoglobin: 10.8 g/dL — ABNORMAL LOW (ref 12.0–15.0)
Hemoglobin: 10.5 g/dL — ABNORMAL LOW (ref 12.0–15.0)
Immature Granulocytes: 1 %
Immature Granulocytes: 0 %
Lymphocytes Relative: 10 %
Lymphocytes Relative: 14 %
Lymphs Abs: 1.5 10*3/uL (ref 0.7–4.0)
Lymphs Abs: 1.8 10*3/uL (ref 0.7–4.0)
MCH: 23.3 pg — ABNORMAL LOW (ref 26.0–34.0)
MCH: 23.2 pg — ABNORMAL LOW (ref 26.0–34.0)
MCHC: 33.6 g/dL (ref 30.0–36.0)
MCHC: 33.4 g/dL (ref 30.0–36.0)
MCV: 69.3 fL — ABNORMAL LOW (ref 80.0–100.0)
MCV: 69.3 fL — ABNORMAL LOW (ref 80.0–100.0)
Monocytes Absolute: 0.5 10*3/uL (ref 0.1–1.0)
Monocytes Absolute: 0.7 10*3/uL (ref 0.1–1.0)
Monocytes Relative: 4 %
Monocytes Relative: 6 %
Neutro Abs: 12.6 10*3/uL — ABNORMAL HIGH (ref 1.7–7.7)
Neutro Abs: 9.8 10*3/uL — ABNORMAL HIGH (ref 1.7–7.7)
Neutrophils Relative %: 85 %
Neutrophils Relative %: 80 %
Platelets: 336 10*3/uL (ref 150–400)
Platelets: 321 10*3/uL (ref 150–400)
RBC: 4.63 MIL/uL (ref 3.87–5.11)
RBC: 4.53 MIL/uL (ref 3.87–5.11)
RDW: 16.1 % — ABNORMAL HIGH (ref 11.5–15.5)
RDW: 16.1 % — ABNORMAL HIGH (ref 11.5–15.5)
WBC: 14.7 10*3/uL — ABNORMAL HIGH (ref 4.0–10.5)
WBC: 12.3 10*3/uL — ABNORMAL HIGH (ref 4.0–10.5)
nRBC: 0 % (ref 0.0–0.2)
nRBC: 0 % (ref 0.0–0.2)

## 2019-01-19 LAB — COMPREHENSIVE METABOLIC PANEL
ALT: 23 U/L (ref 0–44)
AST: 16 U/L (ref 15–41)
Albumin: 3.3 g/dL — ABNORMAL LOW (ref 3.5–5.0)
Alkaline Phosphatase: 55 U/L (ref 38–126)
Anion gap: 8 (ref 5–15)
BUN: 10 mg/dL (ref 6–20)
CO2: 24 mmol/L (ref 22–32)
Calcium: 8.7 mg/dL — ABNORMAL LOW (ref 8.9–10.3)
Chloride: 106 mmol/L (ref 98–111)
Creatinine, Ser: 0.85 mg/dL (ref 0.44–1.00)
GFR calc Af Amer: >60 mL/min (ref >60)
GFR calc non Af Amer: >60 mL/min (ref >60)
Glucose, Bld: 145 mg/dL — ABNORMAL HIGH (ref 70–99)
Potassium: 4.2 mmol/L (ref 3.5–5.1)
Sodium: 138 mmol/L (ref 135–145)
Total Bilirubin: 0.5 mg/dL (ref 0.3–1.2)
Total Protein: 7.1 g/dL (ref 6.5–8.1)

## 2019-01-19 LAB — TYPE AND SCREEN
ABO/RH(D): O NEG
Antibody Screen: NEGATIVE

## 2019-01-19 LAB — ABO/RH: ABO/RH(D): O NEG

## 2019-01-19 MED ORDER — CHLORHEXIDINE GLUCONATE 4 % EX LIQD: 60.0000 mL | Freq: Once | CUTANEOUS | Status: DC

## 2019-01-19 MED ORDER — GABAPENTIN 300 MG PO CAPS: 300.0000 mg | ORAL_CAPSULE | ORAL | Status: DC

## 2019-01-19 MED ORDER — BUPIVACAINE LIPOSOME 1.3 % IJ SUSP: 20.0000 mL | Freq: Once | INTRAMUSCULAR | Status: DC

## 2019-01-19 MED ORDER — ACETAMINOPHEN 500 MG PO TABS: 1000.0000 mg | ORAL_TABLET | ORAL | Status: DC

## 2019-01-19 MED ORDER — PHENOL 1.4 % MT LIQD
1.0000 | OROMUCOSAL | Status: DC | PRN
  Administered 2019-01-19: 1 via OROMUCOSAL
  Filled 2019-01-19: qty 177

## 2019-01-19 MED ORDER — APREPITANT 40 MG PO CAPS: 40.0000 mg | ORAL_CAPSULE | ORAL | Status: DC

## 2019-01-19 MED ORDER — HEPARIN SODIUM (PORCINE) 5000 UNIT/ML IJ SOLN: 5000.0000 [IU] | INTRAMUSCULAR | Status: DC

## 2019-01-19 MED ORDER — SODIUM CHLORIDE 0.9 % IV SOLN: 2.0000 g | INTRAVENOUS | Status: DC

## 2019-01-19 NOTE — Progress Notes (Addendum)
Patient alert and oriented.   Provided support and encouragement.  Encouraged pulmonary toilet and ambulation.  All questions answered.  Will continue to monitor.

## 2019-01-19 NOTE — Discharge Instructions (Signed)
[facility] - DISCHARGE INSTRUCTIONS TO PATIENT  Activity:  Driving - May drive in 2 or 3 days, if doing well and no pain.   Lifting - No lifting more than 15 pounds for 1 week                       Practice your Covid-19 protection:  Wear a mask, social distance, and wash your hands frequently  Wound Care:   Leave the incision dry until tomorrow, then you may shower  Diet:  Clear liquids for 24 hours - then if you are doing well, you may advance to more solid food  Follow up appointment:  Call Dr. Pollie Friar office [facility]) at [phone] on Friday, October 30, about rescheduling the sleeve gastrectomy.        We'll decide whether you need to be seen in the office before surgery.  Medications and dosages:  Resume your home medications.  Call Dr. [name] or his office  [phone]) if you have:  Temperature greater than 100.4,  Persistent nausea and vomiting,  Severe uncontrolled pain,  Redness, tenderness, or signs of infection (pain, swelling, redness, odor or green/yellow discharge around the site),  Difficulty breathing, headache or visual disturbances,  Any other questions or concerns you may have after discharge.  In an emergency, call 911 or go to an Emergency Department at a nearby hospital.

## 2019-01-19 NOTE — Progress Notes (Signed)
Nutrition Brief Note  RD consulted to provide post-op bariatric diet education.  Per chart review and RN, pt did not have scheduled gastric sleeve surgery 10/26. Underwent laparoscopic exploration only.  No diet education needs at this time.   If nutrition issues arise, please consult RD.   Clayton Bibles, MS, RD, LDN Inpatient Clinical Dietitian Pager: [phone] After Hours Pager: [phone]

## 2019-01-19 NOTE — Plan of Care (Signed)
All goals met for d/c.

## 2019-01-19 NOTE — Discharge Summary (Signed)
Physician Discharge Summary  Patient ID:  Sara [name]  MRN: [id number]  DOB/AGE: [birth year] 43 y.o.  Admit date: 01/18/2019 Discharge date: 01/19/2019  Discharge Diagnoses:  1.  Morbid obesity 2. History of depression 3. Knee issues Right knee arthroscopy - 05/05/2015 - [facility] 4. Her lower back bothers her 5.  OSA 6.  Questionable small bowel injury   Active Problems:   Morbid obesity (Ord)  Operation: Procedure(s): LAPAROSCOPIC EXPLORATION on 01/18/2019 [name]  Discharged Condition: good  Hospital Course: Sara [name] is an 43 y.o. female whose primary care physician is Dickie La, MD and who was admitted 01/18/2019 with a chief complaint of morbid obesity.   She was brought to the operating room on 01/18/2019 for an attempted sleeve gastrectomy.  But with introduction of the 5 mm trocar laparoscopically, I was worried about a small bowel injury, and could not find any injury.   I thought it best to stop the opeation, let her recover, make sure that she does not have a small bowel injury, and reschedule her surgery for another day.   I kept her overnight to observe her.  She is sore, but tolerating clear liquids today.  The discharge instructions were reviewed with the patient.  Consults: None  Significant Diagnostic Studies: Results for orders placed or performed during the hospital encounter of 01/18/19  Pregnancy, urine  Result Value Ref Range   Preg Test, Ur NEGATIVE NEGATIVE  CBC WITH DIFFERENTIAL  Result Value Ref Range   WBC 12.3 (H) 4.0 - 10.5 K/uL   RBC 4.53 3.87 - 5.11 MIL/uL   Hemoglobin 10.5 (L) 12.0 - 15.0 g/dL   HCT 31.4 (L) 36.0 - 46.0 %   MCV 69.3 (L) 80.0 - 100.0 fL   MCH 23.2 (L) 26.0 - 34.0 pg   MCHC 33.4 30.0 - 36.0 g/dL   RDW 16.1 (H) 11.5 - 15.5 %   Platelets 321 150 - 400 K/uL   nRBC 0.0 0.0 - 0.2 %   Neutrophils Relative % 80 %   Neutro Abs 9.8 (H) 1.7 - 7.7 K/uL   Lymphocytes Relative 14 %   Lymphs Abs 1.8 0.7 - 4.0 K/uL    Monocytes Relative 6 %   Monocytes Absolute 0.7 0.1 - 1.0 K/uL   Eosinophils Relative 0 %   Eosinophils Absolute 0.0 0.0 - 0.5 K/uL   Basophils Relative 0 %   Basophils Absolute 0.0 0.0 - 0.1 K/uL   Immature Granulocytes 0 %   Abs Immature Granulocytes 0.05 0.00 - 0.07 K/uL  CBC WITH DIFFERENTIAL  Result Value Ref Range   WBC 14.7 (H) 4.0 - 10.5 K/uL   RBC 4.63 3.87 - 5.11 MIL/uL   Hemoglobin 10.8 (L) 12.0 - 15.0 g/dL   HCT 32.1 (L) 36.0 - 46.0 %   MCV 69.3 (L) 80.0 - 100.0 fL   MCH 23.3 (L) 26.0 - 34.0 pg   MCHC 33.6 30.0 - 36.0 g/dL   RDW 16.1 (H) 11.5 - 15.5 %   Platelets 336 150 - 400 K/uL   nRBC 0.0 0.0 - 0.2 %   Neutrophils Relative % 85 %   Neutro Abs 12.6 (H) 1.7 - 7.7 K/uL   Lymphocytes Relative 10 %   Lymphs Abs 1.5 0.7 - 4.0 K/uL   Monocytes Relative 4 %   Monocytes Absolute 0.5 0.1 - 1.0 K/uL   Eosinophils Relative 0 %   Eosinophils Absolute 0.0 0.0 - 0.5 K/uL   Basophils Relative 0 %  Basophils Absolute 0.0 0.0 - 0.1 K/uL   Immature Granulocytes 1 %   Abs Immature Granulocytes 0.09 (H) 0.00 - 0.07 K/uL  Comprehensive metabolic panel  Result Value Ref Range   Sodium 138 135 - 145 mmol/L   Potassium 4.2 3.5 - 5.1 mmol/L   Chloride 106 98 - 111 mmol/L   CO2 24 22 - 32 mmol/L   Glucose, Bld 145 (H) 70 - 99 mg/dL   BUN 10 6 - 20 mg/dL   Creatinine, Ser 9.56 0.44 - 1.00 mg/dL   Calcium 8.7 (L) 8.9 - 10.3 mg/dL   Total Protein 7.1 6.5 - 8.1 g/dL   Albumin 3.3 (L) 3.5 - 5.0 g/dL   AST 16 15 - 41 U/L   ALT 23 0 - 44 U/L   Alkaline Phosphatase 55 38 - 126 U/L   Total Bilirubin 0.5 0.3 - 1.2 mg/dL   GFR calc non Af Amer >60 >60 mL/min   GFR calc Af Amer >60 >60 mL/min   Anion gap 8 5 - 15  Type and screen  Result Value Ref Range   ABO/RH(D) O NEG    Antibody Screen NEG    Sample Expiration      01/22/2019,2359 Performed at [facility], [address], [place], [place] [address]   ABO/Rh  Result Value Ref Range   ABO/RH(D)      O  NEG Performed at [facility], [address], [place], [place] [address]     Dg Chest 2 View  Result Date: 01/12/2019 CLINICAL DATA:  43 year old female for preoperative evaluation. EXAM: CHEST - 2 VIEW COMPARISON:  Chest radiograph dated 07/25/2016 FINDINGS: The heart size and mediastinal contours are within normal limits. Both lungs are clear. The visualized skeletal structures are unremarkable. IMPRESSION: No active cardiopulmonary disease. Electronically Signed   By: Elgie Collard M.D.   On: 01/12/2019 09:17    Discharge Exam:  Vitals:   01/19/19 0212 01/19/19 0635  BP: 140/71 130/75  Pulse: 79 85  Resp: 18 18  Temp: 99.1 F (37.3 C) 98.7 F (37.1 C)  SpO2: 100% 98%    General: Obese AA F who is alert and generally healthy appearing.  Lungs: Clear to auscultation and symmetric breath sounds. Heart:  RRR. No murmur or rub. Abdomen: Soft. No mass. No hernia. Has bowel sounds.   No peritoneal signs.  Discharge Medications:   Allergies as of 01/19/2019      Reactions   Pork-derived Products Other (See Comments)   Religious belief      Medication List    TAKE these medications   acetaminophen 500 MG tablet Commonly known as: TYLENOL Take 1,000 mg by mouth every 6 (six) hours as needed for moderate pain or headache.   furosemide 20 MG tablet Commonly known as: LASIX Take 1 tablet (20 mg total) by mouth daily. What changed: when to take this       Disposition: Discharge disposition: 01-Home or Self Care       Discharge Instructions    Increase activity slowly   Complete by: As directed          Signed: Ovidio Kin, M.D., [facility] Surgery Office:  [phone]  01/19/2019, 10:26 AM

## 2019-01-19 NOTE — Progress Notes (Signed)
Pt alert, oriented, tolerating diet. D/C instructions given, pt dc/d home. 

## 2019-01-19 NOTE — Progress Notes (Signed)
MD paged regarding patient complaints of throat irritation. Received an order for chloraseptic spray. Will continue to monitor.

## 2019-01-21 ENCOUNTER — Telehealth: Payer: Self-pay | Admitting: Pulmonary Disease

## 2019-01-21 NOTE — Telephone Encounter (Signed)
Spoke to [place] at [employer].  She has the order and is not sure why she didn't get it pulled.  She is going to get it taken care of today and call the pt.  Nothing further needed.

## 2019-01-21 NOTE — Telephone Encounter (Signed)
I will check with Sonia Baller at [employer].

## 2019-02-02 ENCOUNTER — Ambulatory Visit: Payer: BC Managed Care – PPO

## 2019-02-02 NOTE — Patient Instructions (Addendum)
DUE TO COVID-19 ONLY ONE VISITOR IS ALLOWED TO COME WITH YOU AND STAY IN THE WAITING ROOM ONLY DURING PRE OP AND PROCEDURE DAY OF SURGERY. THE 1 VISITOR MAY VISIT WITH YOU AFTER SURGERY IN YOUR PRIVATE ROOM DURING VISITING HOURS ONLY!  YOU NEED TO HAVE A COVID 19 TEST ON_Thursday 11/12/2020_____ @_______ , THIS TEST MUST BE DONE BEFORE SURGERY, COME  [address], [place] , [phone].  [facility]) ONCE YOUR COVID TEST IS COMPLETED, PLEASE BEGIN THE QUARANTINE INSTRUCTIONS AS OUTLINED IN YOUR HANDOUT.                Sara [name]   Your procedure is scheduled on: Monday 02/08/2019    [facility] Main  Entr[facility]    Report to Admitting at 10:25 AM     Call this number if you have problems the morning of surgery [phone]    Remember: MORNING OF SURGERY DRINK:   DRINK 1 G2 drink BEFORE YOU LEAVE HOME, DRINK ALL OF THE  G2 DRINK AT ONE TIME.   NO SOLID FOOD AFTER 600 PM THE NIGHT BEFORE YOUR SURGERY. YOU MAY DRINK CLEAR FLUIDS. THE G2 DRINK YOU DRINK BEFORE YOU LEAVE HOME WILL BE THE LAST FLUIDS YOU DRINK BEFORE SURGERY.      CLEAR LIQUID DIET   Foods Allowed                                                                     Foods Excluded  Coffee and tea, regular and decaf                             liquids that you cannot  Plain Jell-O any favor except red or purple                                           see through such as: Fruit ices (not with fruit pulp)                                     milk, soups, orange juice  Iced Popsicles                                    All solid food Carbonated beverages, regular and diet                                    Cranberry, grape and apple juices Sports drinks like Gatorade Lightly seasoned clear broth or consume(fat free) Sugar, honey syrup  Sample Menu Breakfast                                Lunch  Supper Cranberry juice                    Beef broth                             Chicken broth Jell-O                                     Grape juice                           Apple juice Coffee or tea                        Jell-O                                      Popsicle                                                Coffee or tea                        Coffee or tea  _____________________________________________________________________    PAIN IS EXPECTED AFTER SURGERY AND WILL NOT BE COMPLETELY ELIMINATED. AMBULATION AND TYLENOL WILL HELP REDUCE INCISIONAL AND GAS PAIN. MOVEMENT IS KEY!  YOU ARE EXPECTED TO BE OUT OF BED WITHIN 4 HOURS OF ADMISSION TO YOUR PATIENT ROOM.  SITTING IN THE RECLINER THROUGHOUT THE DAY IS IMPORTANT FOR DRINKING FLUIDS AND MOVING GAS THROUGHOUT THE GI TRACT.  COMPRESSION STOCKINGS SHOULD BE WORN Clifford UNLESS YOU ARE WALKING.   INCENTIVE SPIROMETER SHOULD BE USED EVERY HOUR WHILE AWAKE TO DECREASE POST-OPERATIVE COMPLICATIONS SUCH AS PNEUMONIA.  WHEN DISCHARGED HOME, IT IS IMPORTANT TO CONTINUE TO WALK EVERY HOUR AND USE THE INCENTIVE SPIROMETER EVERY HOUR.                   You may not have any metal on your body including hair pins and              piercings     Do not wear jewelry, make-up, lotions, powders or perfumes, deodorant              Do not wear nail polish on your fingernails.  Do not shave  48 hours prior to surgery.               Do not bring valuables to the hospital. [place].  Contacts, dentures or bridgework may not be worn into surgery.  Leave suitcase in the car. After surgery it may be brought to your room.                  Please read over the following fact sheets you were given: __________________________________________________________________            [facility] - Preparing for Surgery Before surgery, you can play an important role.  Because skin is not sterile, your skin  needs to be as  free of germs as possible.  You can reduce the number of germs on your skin by washing with CHG (chlorahexidine gluconate) soap before surgery.  CHG is an antiseptic cleaner which kills germs and bonds with the skin to continue killing germs even after washing. Please DO NOT use if you have an allergy to CHG or antibacterial soaps.  If your skin becomes reddened/irritated stop using the CHG and inform your nurse when you arrive at Short Stay. Do not shave (including legs and underarms) for at least 48 hours prior to the first CHG shower.  You may shave your face/neck. Please follow these instructions carefully:  1.  Shower with CHG Soap the night before surgery and the  morning of Surgery.  2.  If you choose to wash your hair, wash your hair first as usual with your  normal  shampoo.  3.  After you shampoo, rinse your hair and body thoroughly to remove the  shampoo.                            4.  Use CHG as you would any other liquid soap.  You can apply chg directly  to the skin and wash                       Gently with a scrungie or clean washcloth.  5.  Apply the CHG Soap to your body ONLY FROM THE NECK DOWN.   Do not use on face/ open                           Wound or open sores. Avoid contact with eyes, ears mouth and genitals (private parts).                       Wash face,  Genitals (private parts) with your normal soap.             6.  Wash thoroughly, paying special attention to the area where your surgery  will be performed.  7.  Thoroughly rinse your body with warm water from the neck down.  8.  DO NOT shower/wash with your normal soap after using and rinsing off  the CHG Soap.                9.  Pat yourself dry with a clean towel.            10.  Wear clean pajamas.            11.  Place clean sheets on your bed the night of your first shower and do not  sleep with pets. Day of Surgery : Do not apply any lotions/deodorants the morning of surgery.  Please wear clean clothes to the  hospital/surgery center.  FAILURE TO FOLLOW THESE INSTRUCTIONS MAY RESULT IN THE CANCELLATION OF YOUR SURGERY PATIENT SIGNATURE_________________________________  NURSE SIGNATURE__________________________________  ________________________________________________________________________

## 2019-02-02 NOTE — Progress Notes (Signed)
PCP - Nori Riis  Surgical Clearance from Dr. Lawana Pai 01/04/19   Cardiologist -   Chest x-ray - 01-12-19  EKG - 11-09-18   Stress Test -  ECHO -  Cardiac Cath -   Sleep Study -  CPAP -   Fasting Blood Sugar -  Checks Blood Sugar _____ times a day  Blood Thinner Instructions: Aspirin Instructions: Last Dose:  Anesthesia review:   Patient denies shortness of breath, fever, cough and chest pain at PAT appointment   Patient verbalized understanding of instructions that were given to them at the PAT appointment. Patient was also instructed that they will need to review over the PAT instructions again at home before surgery.

## 2019-02-03 ENCOUNTER — Other Ambulatory Visit: Payer: Self-pay

## 2019-02-03 ENCOUNTER — Encounter (HOSPITAL_COMMUNITY): Payer: Self-pay

## 2019-02-03 ENCOUNTER — Encounter (HOSPITAL_COMMUNITY)
Admission: RE | Admit: 2019-02-03 | Discharge: 2019-02-03 | Disposition: A | Discharge status: Home / Self Care | Payer: BC Managed Care – PPO | Source: Ambulatory Visit | Attending: Surgery | Admitting: Surgery

## 2019-02-03 DIAGNOSIS — Z01812 Encounter for preprocedural laboratory examination: Secondary | ICD-10-CM | POA: Insufficient documentation

## 2019-02-03 DIAGNOSIS — Z6841 Body Mass Index (BMI) 40.0 and over, adult: Secondary | ICD-10-CM | POA: Insufficient documentation

## 2019-02-03 LAB — CBC
HCT: 39.6 % (ref 36.0–46.0)
Hemoglobin: 12.8 g/dL (ref 12.0–15.0)
MCH: 22.8 pg — ABNORMAL LOW (ref 26.0–34.0)
MCHC: 32.3 g/dL (ref 30.0–36.0)
MCV: 70.5 fL — ABNORMAL LOW (ref 80.0–100.0)
Platelets: 343 10*3/uL (ref 150–400)
RBC: 5.62 MIL/uL — ABNORMAL HIGH (ref 3.87–5.11)
RDW: 16.5 % — ABNORMAL HIGH (ref 11.5–15.5)
WBC: 8.1 10*3/uL (ref 4.0–10.5)
nRBC: 0 % (ref 0.0–0.2)

## 2019-02-03 LAB — BASIC METABOLIC PANEL
Anion gap: 11 (ref 5–15)
BUN: 16 mg/dL (ref 6–20)
CO2: 23 mmol/L (ref 22–32)
Calcium: 9.3 mg/dL (ref 8.9–10.3)
Chloride: 102 mmol/L (ref 98–111)
Creatinine, Ser: 0.88 mg/dL (ref 0.44–1.00)
GFR calc Af Amer: >60 mL/min (ref >60)
GFR calc non Af Amer: >60 mL/min (ref >60)
Glucose, Bld: 100 mg/dL — ABNORMAL HIGH (ref 70–99)
Potassium: 3.6 mmol/L (ref 3.5–5.1)
Sodium: 136 mmol/L (ref 135–145)

## 2019-02-04 ENCOUNTER — Other Ambulatory Visit (HOSPITAL_COMMUNITY)
Admission: RE | Admit: 2019-02-04 | Discharge: 2019-02-04 | Disposition: A | Discharge status: Home / Self Care | Payer: BC Managed Care – PPO | Source: Ambulatory Visit | Attending: Surgery | Admitting: Surgery

## 2019-02-04 DIAGNOSIS — Z01812 Encounter for preprocedural laboratory examination: Secondary | ICD-10-CM | POA: Diagnosis not present

## 2019-02-04 DIAGNOSIS — Z20828 Contact with and (suspected) exposure to other viral communicable diseases: Secondary | ICD-10-CM | POA: Insufficient documentation

## 2019-02-06 LAB — NOVEL CORONAVIRUS, NAA (HOSP ORDER, SEND-OUT TO REF LAB; TAT 18-24 HRS): SARS-CoV-2, NAA: NOT DETECTED
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
## 2019-02-08 ENCOUNTER — Encounter (HOSPITAL_COMMUNITY): Payer: Self-pay | Admitting: *Deleted

## 2019-02-08 ENCOUNTER — Other Ambulatory Visit: Payer: Self-pay | Admitting: Surgery

## 2019-02-08 ENCOUNTER — Other Ambulatory Visit: Payer: Self-pay

## 2019-02-08 ENCOUNTER — Inpatient Hospital Stay (HOSPITAL_COMMUNITY): Payer: BC Managed Care – PPO | Admitting: Certified Registered"

## 2019-02-08 ENCOUNTER — Inpatient Hospital Stay (HOSPITAL_COMMUNITY): Payer: BC Managed Care – PPO | Admitting: Physician Assistant

## 2019-02-08 ENCOUNTER — Inpatient Hospital Stay (HOSPITAL_COMMUNITY)
Admission: RE | Admit: 2019-02-08 | Discharge: 2019-02-10 | DRG: 621 | Disposition: A | Discharge status: Home / Self Care | Payer: BC Managed Care – PPO | Attending: Surgery | Admitting: Surgery

## 2019-02-08 ENCOUNTER — Encounter (HOSPITAL_COMMUNITY)
Admission: RE | Disposition: A | Discharge status: Home / Self Care | Payer: Self-pay | Source: Home / Self Care | Attending: Surgery

## 2019-02-08 DIAGNOSIS — Z6841 Body Mass Index (BMI) 40.0 and over, adult: Secondary | ICD-10-CM | POA: Diagnosis not present

## 2019-02-08 DIAGNOSIS — K449 Diaphragmatic hernia without obstruction or gangrene: Secondary | ICD-10-CM | POA: Diagnosis not present

## 2019-02-08 DIAGNOSIS — G4733 Obstructive sleep apnea (adult) (pediatric): Secondary | ICD-10-CM | POA: Diagnosis present

## 2019-02-08 DIAGNOSIS — I10 Essential (primary) hypertension: Secondary | ICD-10-CM | POA: Diagnosis not present

## 2019-02-08 DIAGNOSIS — Z6839 Body mass index (BMI) 39.0-39.9, adult: Secondary | ICD-10-CM

## 2019-02-08 HISTORY — DX: Other specified postprocedural states: Z98.890

## 2019-02-08 HISTORY — PX: LAPAROSCOPIC GASTRIC SLEEVE RESECTION: SHX5895

## 2019-02-08 HISTORY — DX: Other specified postprocedural states: R11.2

## 2019-02-08 LAB — PREGNANCY, URINE: Preg Test, Ur: NEGATIVE

## 2019-02-08 LAB — HEMOGLOBIN AND HEMATOCRIT, BLOOD
HCT: 37.2 % (ref 36.0–46.0)
Hemoglobin: 11.8 g/dL — ABNORMAL LOW (ref 12.0–15.0)

## 2019-02-08 SURGERY — GASTRECTOMY, SLEEVE, LAPAROSCOPIC
Anesthesia: General

## 2019-02-08 MED ORDER — OXYCODONE HCL 5 MG/5ML PO SOLN
5.0000 mg | Freq: Four times a day (QID) | ORAL | Status: DC | PRN
  Administered 2019-02-08 – 2019-02-10 (×6): 5 mg via ORAL
  Filled 2019-02-08 (×8): qty 5

## 2019-02-08 MED ORDER — ACETAMINOPHEN 500 MG PO TABS
1000.0000 mg | ORAL_TABLET | Freq: Three times a day (TID) | ORAL | Status: DC
  Administered 2019-02-08 – 2019-02-10 (×4): 1000 mg via ORAL
  Filled 2019-02-08 (×4): qty 2

## 2019-02-08 MED ORDER — ENOXAPARIN SODIUM 30 MG/0.3ML ~~LOC~~ SOLN
30.0000 mg | Freq: Two times a day (BID) | SUBCUTANEOUS | Status: DC
  Administered 2019-02-08 – 2019-02-10 (×4): 30 mg via SUBCUTANEOUS
  Filled 2019-02-08 (×4): qty 0.3

## 2019-02-08 MED ORDER — MIDAZOLAM HCL 2 MG/2ML IJ SOLN
INTRAMUSCULAR | Status: AC
  Filled 2019-02-08: qty 2

## 2019-02-08 MED ORDER — ENSURE MAX PROTEIN PO LIQD
2.0000 [oz_av] | ORAL | Status: DC
  Administered 2019-02-09 – 2019-02-10 (×6): 2 [oz_av] via ORAL

## 2019-02-08 MED ORDER — FENTANYL CITRATE (PF) 100 MCG/2ML IJ SOLN
INTRAMUSCULAR | Status: DC | PRN
  Administered 2019-02-08 (×4): 50 ug via INTRAVENOUS

## 2019-02-08 MED ORDER — KETAMINE HCL 10 MG/ML IJ SOLN
INTRAMUSCULAR | Status: DC | PRN
  Administered 2019-02-08: 10 mg via INTRAVENOUS
  Administered 2019-02-08: 20 mg via INTRAVENOUS

## 2019-02-08 MED ORDER — PROPOFOL 10 MG/ML IV BOLUS
INTRAVENOUS | Status: DC | PRN
  Administered 2019-02-08: 250 mg via INTRAVENOUS

## 2019-02-08 MED ORDER — BUPIVACAINE-EPINEPHRINE 0.25% -1:200000 IJ SOLN
INTRAMUSCULAR | Status: DC | PRN
  Administered 2019-02-08: 30 mL

## 2019-02-08 MED ORDER — TISSEEL VH 10 ML EX KIT
PACK | CUTANEOUS | Status: AC
  Filled 2019-02-08: qty 1

## 2019-02-08 MED ORDER — CHLORHEXIDINE GLUCONATE 4 % EX LIQD: 60.0000 mL | Freq: Once | CUTANEOUS | Status: DC

## 2019-02-08 MED ORDER — ONDANSETRON HCL 4 MG/2ML IJ SOLN
INTRAMUSCULAR | Status: AC
  Filled 2019-02-08: qty 2

## 2019-02-08 MED ORDER — BUPIVACAINE LIPOSOME 1.3 % IJ SUSP
20.0000 mL | Freq: Once | INTRAMUSCULAR | Status: DC
  Filled 2019-02-08: qty 20

## 2019-02-08 MED ORDER — HEPARIN SODIUM (PORCINE) 5000 UNIT/ML IJ SOLN
5000.0000 [IU] | INTRAMUSCULAR | Status: AC
  Administered 2019-02-08: 5000 [IU] via SUBCUTANEOUS
  Filled 2019-02-08: qty 1

## 2019-02-08 MED ORDER — SCOPOLAMINE 1 MG/3DAYS TD PT72
1.0000 | MEDICATED_PATCH | TRANSDERMAL | Status: DC
  Administered 2019-02-08: 1.5 mg via TRANSDERMAL
  Filled 2019-02-08: qty 1

## 2019-02-08 MED ORDER — ACETAMINOPHEN 500 MG PO TABS
1000.0000 mg | ORAL_TABLET | ORAL | Status: AC
  Administered 2019-02-08: 1000 mg via ORAL
  Filled 2019-02-08: qty 2

## 2019-02-08 MED ORDER — BUPIVACAINE LIPOSOME 1.3 % IJ SUSP
INTRAMUSCULAR | Status: DC | PRN
  Administered 2019-02-08: 20 mL

## 2019-02-08 MED ORDER — LIDOCAINE 2% (20 MG/ML) 5 ML SYRINGE
INTRAMUSCULAR | Status: DC | PRN
  Administered 2019-02-08: 80 mg via INTRAVENOUS

## 2019-02-08 MED ORDER — METOPROLOL TARTRATE 5 MG/5ML IV SOLN: 5.0000 mg | Freq: Four times a day (QID) | INTRAVENOUS | Status: DC | PRN

## 2019-02-08 MED ORDER — SUCCINYLCHOLINE CHLORIDE 200 MG/10ML IV SOSY
PREFILLED_SYRINGE | INTRAVENOUS | Status: DC | PRN
  Administered 2019-02-08: 140 mg via INTRAVENOUS

## 2019-02-08 MED ORDER — FENTANYL CITRATE (PF) 100 MCG/2ML IJ SOLN
25.0000 ug | INTRAMUSCULAR | Status: DC | PRN
  Administered 2019-02-08 (×2): 50 ug via INTRAVENOUS

## 2019-02-08 MED ORDER — DEXAMETHASONE SODIUM PHOSPHATE 10 MG/ML IJ SOLN
INTRAMUSCULAR | Status: DC | PRN
  Administered 2019-02-08: 8 mg via INTRAVENOUS

## 2019-02-08 MED ORDER — FENTANYL CITRATE (PF) 100 MCG/2ML IJ SOLN
INTRAMUSCULAR | Status: AC
  Administered 2019-02-08: 50 ug via INTRAVENOUS
  Filled 2019-02-08: qty 2

## 2019-02-08 MED ORDER — LIDOCAINE 2% (20 MG/ML) 5 ML SYRINGE
INTRAMUSCULAR | Status: DC | PRN
  Administered 2019-02-08: 1.5 mg/kg/h via INTRAVENOUS

## 2019-02-08 MED ORDER — SUGAMMADEX SODIUM 500 MG/5ML IV SOLN
INTRAVENOUS | Status: AC
  Filled 2019-02-08: qty 5

## 2019-02-08 MED ORDER — LIDOCAINE 2% (20 MG/ML) 5 ML SYRINGE
INTRAMUSCULAR | Status: AC
  Filled 2019-02-08: qty 5

## 2019-02-08 MED ORDER — BUPIVACAINE-EPINEPHRINE 0.25% -1:200000 IJ SOLN
INTRAMUSCULAR | Status: AC
  Filled 2019-02-08: qty 1

## 2019-02-08 MED ORDER — LIDOCAINE HCL 2 % IJ SOLN
INTRAMUSCULAR | Status: AC
  Filled 2019-02-08: qty 20

## 2019-02-08 MED ORDER — APREPITANT 40 MG PO CAPS
40.0000 mg | ORAL_CAPSULE | ORAL | Status: AC
  Administered 2019-02-08: 40 mg via ORAL
  Filled 2019-02-08: qty 1

## 2019-02-08 MED ORDER — DEXAMETHASONE SODIUM PHOSPHATE 10 MG/ML IJ SOLN
INTRAMUSCULAR | Status: AC
  Filled 2019-02-08: qty 1

## 2019-02-08 MED ORDER — SODIUM CHLORIDE 0.9 % IV SOLN
2.0000 g | INTRAVENOUS | Status: AC
  Administered 2019-02-08: 2 g via INTRAVENOUS
  Filled 2019-02-08: qty 2

## 2019-02-08 MED ORDER — SUGAMMADEX SODIUM 200 MG/2ML IV SOLN
INTRAVENOUS | Status: DC | PRN
  Administered 2019-02-08: 300 mg via INTRAVENOUS

## 2019-02-08 MED ORDER — ONDANSETRON HCL 4 MG/2ML IJ SOLN
4.0000 mg | Freq: Once | INTRAMUSCULAR | Status: AC | PRN
  Administered 2019-02-08: 4 mg via INTRAVENOUS

## 2019-02-08 MED ORDER — PROPOFOL 10 MG/ML IV BOLUS
INTRAVENOUS | Status: AC
  Filled 2019-02-08: qty 20

## 2019-02-08 MED ORDER — MIDAZOLAM HCL 2 MG/2ML IJ SOLN
INTRAMUSCULAR | Status: DC | PRN
  Administered 2019-02-08: 2 mg via INTRAVENOUS

## 2019-02-08 MED ORDER — LABETALOL HCL 5 MG/ML IV SOLN
INTRAVENOUS | Status: AC
  Administered 2019-02-08: 10 mg via INTRAVENOUS
  Filled 2019-02-08: qty 4

## 2019-02-08 MED ORDER — FENTANYL CITRATE (PF) 100 MCG/2ML IJ SOLN
INTRAMUSCULAR | Status: AC
  Filled 2019-02-08: qty 2

## 2019-02-08 MED ORDER — ACETAMINOPHEN 160 MG/5ML PO SOLN
1000.0000 mg | Freq: Three times a day (TID) | ORAL | Status: DC
  Administered 2019-02-09: 13:00:00 1000 mg via ORAL
  Filled 2019-02-08: qty 40.6

## 2019-02-08 MED ORDER — MEPERIDINE HCL 50 MG/ML IJ SOLN
INTRAMUSCULAR | Status: AC
  Administered 2019-02-08: 12.5 mg via INTRAVENOUS
  Filled 2019-02-08: qty 1

## 2019-02-08 MED ORDER — MORPHINE SULFATE (PF) 4 MG/ML IV SOLN
1.0000 mg | INTRAVENOUS | Status: DC | PRN
  Administered 2019-02-08 (×2): 2 mg via INTRAVENOUS
  Administered 2019-02-09: 1 mg via INTRAVENOUS
  Administered 2019-02-09 (×2): 3 mg via INTRAVENOUS
  Filled 2019-02-08 (×5): qty 1

## 2019-02-08 MED ORDER — PANTOPRAZOLE SODIUM 40 MG IV SOLR
40.0000 mg | Freq: Every day | INTRAVENOUS | Status: DC
  Administered 2019-02-08 – 2019-02-09 (×2): 40 mg via INTRAVENOUS
  Filled 2019-02-08 (×2): qty 40

## 2019-02-08 MED ORDER — LACTATED RINGERS IR SOLN
Status: DC | PRN
  Administered 2019-02-08: 1000 mL

## 2019-02-08 MED ORDER — LABETALOL HCL 5 MG/ML IV SOLN
10.0000 mg | Freq: Once | INTRAVENOUS | Status: AC
  Administered 2019-02-08: 15:00:00 10 mg via INTRAVENOUS

## 2019-02-08 MED ORDER — TISSEEL VH 10 ML EX KIT
PACK | CUTANEOUS | Status: DC | PRN
  Administered 2019-02-08: 1

## 2019-02-08 MED ORDER — MEPERIDINE HCL 50 MG/ML IJ SOLN
12.5000 mg | Freq: Once | INTRAMUSCULAR | Status: AC
  Administered 2019-02-08: 15:00:00 12.5 mg via INTRAVENOUS

## 2019-02-08 MED ORDER — LACTATED RINGERS IV SOLN
INTRAVENOUS | Status: DC
  Administered 2019-02-08: 11:00:00 via INTRAVENOUS

## 2019-02-08 MED ORDER — ONDANSETRON HCL 4 MG/2ML IJ SOLN
INTRAMUSCULAR | Status: DC | PRN
  Administered 2019-02-08: 4 mg via INTRAVENOUS

## 2019-02-08 MED ORDER — GABAPENTIN 300 MG PO CAPS
300.0000 mg | ORAL_CAPSULE | ORAL | Status: AC
  Administered 2019-02-08: 300 mg via ORAL
  Filled 2019-02-08: qty 1

## 2019-02-08 MED ORDER — KCL IN DEXTROSE-NACL 20-5-0.45 MEQ/L-%-% IV SOLN
INTRAVENOUS | Status: DC
  Administered 2019-02-08 – 2019-02-10 (×4): via INTRAVENOUS
  Filled 2019-02-08 (×4): qty 1000

## 2019-02-08 MED ORDER — SUCCINYLCHOLINE CHLORIDE 200 MG/10ML IV SOSY
PREFILLED_SYRINGE | INTRAVENOUS | Status: AC
  Filled 2019-02-08: qty 10

## 2019-02-08 MED ORDER — ROCURONIUM BROMIDE 10 MG/ML (PF) SYRINGE
PREFILLED_SYRINGE | INTRAVENOUS | Status: DC | PRN
  Administered 2019-02-08: 50 mg via INTRAVENOUS
  Administered 2019-02-08: 10 mg via INTRAVENOUS

## 2019-02-08 MED ORDER — ROCURONIUM BROMIDE 10 MG/ML (PF) SYRINGE
PREFILLED_SYRINGE | INTRAVENOUS | Status: AC
  Filled 2019-02-08: qty 10

## 2019-02-08 MED ORDER — ONDANSETRON HCL 4 MG/2ML IJ SOLN
4.0000 mg | INTRAMUSCULAR | Status: DC | PRN
  Administered 2019-02-09 (×3): 4 mg via INTRAVENOUS
  Filled 2019-02-08 (×4): qty 2

## 2019-02-08 SURGICAL SUPPLY — 70 items
ADH SKN CLS APL DERMABOND .7 (GAUZE/BANDAGES/DRESSINGS) ×1
APL PRP STRL LF DISP 70% ISPRP (MISCELLANEOUS) ×2
APL SRG 32X5 SNPLK LF DISP (MISCELLANEOUS) ×1
APL SWBSTK 6 STRL LF DISP (MISCELLANEOUS)
APPLICATOR COTTON TIP 6 STRL (MISCELLANEOUS) IMPLANT
APPLICATOR COTTON TIP 6IN STRL (MISCELLANEOUS)
APPLIER CLIP ROT 10 11.4 M/L (STAPLE)
APPLIER CLIP ROT 13.4 12 LRG (CLIP) ×2
APR CLP LRG 13.4X12 ROT 20 MLT (CLIP) ×1
APR CLP MED LRG 11.4X10 (STAPLE)
BLADE SURG 15 STRL LF DISP TIS (BLADE) ×1 IMPLANT
BLADE SURG 15 STRL SS (BLADE) ×2
CABLE HIGH FREQUENCY MONO STRZ (ELECTRODE) IMPLANT
CHLORAPREP W/TINT 26 (MISCELLANEOUS) ×3 IMPLANT
CLIP APPLIE ROT 10 11.4 M/L (STAPLE) IMPLANT
CLIP APPLIE ROT 13.4 12 LRG (CLIP) IMPLANT
COVER WAND RF STERILE (DRAPES) IMPLANT
DERMABOND ADVANCED (GAUZE/BANDAGES/DRESSINGS) ×1
DERMABOND ADVANCED .7 DNX12 (GAUZE/BANDAGES/DRESSINGS) ×1 IMPLANT
DEVICE SUT QUICK LOAD TK 5 (STAPLE) IMPLANT
DEVICE SUT TI-KNOT TK 5X26 (MISCELLANEOUS) IMPLANT
DEVICE SUTURE ENDOST 10MM (ENDOMECHANICALS) IMPLANT
DISSECTOR BLUNT TIP ENDO 5MM (MISCELLANEOUS) IMPLANT
DRAPE UTILITY XL STRL (DRAPES) ×4 IMPLANT
ELECT REM PT RETURN 15FT ADLT (MISCELLANEOUS) ×2 IMPLANT
GLOVE SURG SYN 7.5  E (GLOVE) ×1
GLOVE SURG SYN 7.5 E (GLOVE) ×1 IMPLANT
GLOVE SURG SYN 7.5 PF PI (GLOVE) ×1 IMPLANT
GOWN STRL REUS W/TWL XL LVL3 (GOWN DISPOSABLE) ×7 IMPLANT
GRASPER SUT TROCAR 14GX15 (MISCELLANEOUS) IMPLANT
HOVERMATT SINGLE USE (MISCELLANEOUS) ×2 IMPLANT
KIT BASIN OR (CUSTOM PROCEDURE TRAY) ×2 IMPLANT
KIT TURNOVER KIT A (KITS) IMPLANT
MARKER SKIN DUAL TIP RULER LAB (MISCELLANEOUS) ×2 IMPLANT
NDL SPNL 22GX3.5 QUINCKE BK (NEEDLE) ×1 IMPLANT
NEEDLE SPNL 22GX3.5 QUINCKE BK (NEEDLE) ×2 IMPLANT
PACK UNIVERSAL I (CUSTOM PROCEDURE TRAY) ×2 IMPLANT
RELOAD STAPLE 60 3.6 BLU REG (STAPLE) IMPLANT
RELOAD STAPLE 60 3.8 GOLD REG (STAPLE) IMPLANT
RELOAD STAPLE 60 4.1 GRN THCK (STAPLE) ×2 IMPLANT
RELOAD STAPLER BLUE 60MM (STAPLE) ×2 IMPLANT
RELOAD STAPLER GOLD 60MM (STAPLE) ×1 IMPLANT
RELOAD STAPLER GREEN 60MM (STAPLE) ×2 IMPLANT
SCISSORS LAP 5X35 DISP (ENDOMECHANICALS) ×2 IMPLANT
SEALANT SURGICAL APPL DUAL CAN (MISCELLANEOUS) ×2 IMPLANT
SET IRRIG TUBING LAPAROSCOPIC (IRRIGATION / IRRIGATOR) ×2 IMPLANT
SET TUBE SMOKE EVAC HIGH FLOW (TUBING) ×2 IMPLANT
SHEARS HARMONIC ACE PLUS 45CM (MISCELLANEOUS) ×2 IMPLANT
SLEEVE ADV FIXATION 5X100MM (TROCAR) ×4 IMPLANT
SLEEVE GASTRECTOMY 36FR VISIGI (MISCELLANEOUS) ×2 IMPLANT
SOL ANTI FOG 6CC (MISCELLANEOUS) ×1 IMPLANT
SOLUTION ANTI FOG 6CC (MISCELLANEOUS) ×1
SPONGE LAP 18X18 RF (DISPOSABLE) ×2 IMPLANT
STAPLER ECHELON LONG 60 440 (INSTRUMENTS) ×2 IMPLANT
STAPLER RELOAD BLUE 60MM (STAPLE) ×4
STAPLER RELOAD GOLD 60MM (STAPLE) ×2
STAPLER RELOAD GREEN 60MM (STAPLE) ×4
SUT MNCRL AB 4-0 PS2 18 (SUTURE) ×3 IMPLANT
SUT SURGIDAC NAB ES-9 0 48 120 (SUTURE) IMPLANT
SUT VICRYL 0 TIES 12 18 (SUTURE) IMPLANT
SYR 10ML ECCENTRIC (SYRINGE) ×2 IMPLANT
SYR CONTROL 10ML LL (SYRINGE) ×2 IMPLANT
TOWEL OR 17X26 10 PK STRL BLUE (TOWEL DISPOSABLE) ×2 IMPLANT
TOWEL OR NON WOVEN STRL DISP B (DISPOSABLE) ×2 IMPLANT
TROCAR ADV FIXATION 12X100MM (TROCAR) ×3 IMPLANT
TROCAR ADV FIXATION 5X100MM (TROCAR) ×2 IMPLANT
TROCAR BLADELESS 15MM (ENDOMECHANICALS) ×2 IMPLANT
TROCAR BLADELESS OPT 5 100 (ENDOMECHANICALS) ×2 IMPLANT
TUBE CALIBRATION LAPBAND (TUBING) ×1 IMPLANT
TUBING CONNECTING 10 (TUBING) ×2 IMPLANT

## 2019-02-08 NOTE — Anesthesia Procedure Notes (Signed)
Procedure Name: Intubation Date/Time: 02/08/2019 12:58 PM Performed by: Niel Hummer, CRNA Pre-anesthesia Checklist: Patient identified, Emergency Drugs available, Suction available and Patient being monitored Patient Re-evaluated:Patient Re-evaluated prior to induction Oxygen Delivery Method: Circle system utilized Preoxygenation: Pre-oxygenation with 100% oxygen Induction Type: IV induction Laryngoscope Size: Mac and 4 Grade View: Grade I Tube type: Oral Tube size: 7.0 mm Number of attempts: 1 Airway Equipment and Method: Stylet Placement Confirmation: ETT inserted through vocal cords under direct vision,  positive ETCO2 and breath sounds checked- equal and bilateral Secured at: 22 cm Tube secured with: Tape Dental Injury: Teeth and Oropharynx as per pre-operative assessment

## 2019-02-08 NOTE — Op Note (Signed)
Preoperative diagnosis: laparoscopic sleeve gastrectomy  Postoperative diagnosis: Same   Procedure: Upper endoscopy   Surgeon: Erinn Huskins, M.D.  Anesthesia: Gen.   Indications for procedure: This patient was undergoing a laparoscopic sleeve gastrectomy.   Description of procedure: The endoscopy was placed in the mouth and into the oropharynx and under endoscopic vision it was advanced to the esophagogastric junction. The pouch was insufflated and no bleeding or bubbles were seen. The GEJ was identified at 38 cm from the teeth. No bleeding or leaks were detected. The scope was withdrawn without difficulty.   Maurica Omura, M.D. General, Bariatric, & Minimally Invasive Surgery [facility]    

## 2019-02-08 NOTE — Discharge Instructions (Signed)
° ° ° °GASTRIC BYPASS/SLEEVE ° Home Care Instructions ° ° These instructions are to help you care for yourself when you go home. ° °Call: If you have any problems. °• Call [phone] and ask for the surgeon on call °• If you need immediate help, come to the ER at [place].  °• Tell the ER staff that you are a new post-op gastric bypass or gastric sleeve patient °  °Signs and symptoms to report: • Severe vomiting or nausea °o If you cannot keep down clear liquids for longer than 1 day, call your surgeon  °• Abdominal pain that does not get better after taking your pain medication °• Fever over 100.4° F with chills °• Heart beating over 100 beats a minute °• Shortness of breath at rest °• Chest pain °•  Redness, swelling, drainage, or foul odor at incision (surgical) sites °•  If your incisions open or pull apart °• Swelling or pain in calf (lower leg) °• Diarrhea (Loose bowel movements that happen often), frequent watery, uncontrolled bowel movements °• Constipation, (no bowel movements for 3 days) if this happens: Pick one °o Milk of Magnesia, 2 tablespoons by mouth, 3 times a day for 2 days if needed °o Stop taking Milk of Magnesia once you have a bowel movement °o Call your doctor if constipation continues °Or °o Miralax  (instead of Milk of Magnesia) following the label instructions °o Stop taking Miralax once you have a bowel movement °o Call your doctor if constipation continues °• Anything you think is not normal °  °Normal side effects after surgery: • Unable to sleep at night or unable to focus °• Irritability or moody °• Being tearful (crying) or depressed °These are common complaints, possibly related to your anesthesia medications that put you to sleep, stress of surgery, and change in lifestyle.  This usually goes away a few weeks after surgery.  If these feelings continue, call your primary care doctor. °  °Wound Care: You may have surgical glue, steri-strips, or staples over your incisions after  surgery °• Surgical glue:  Looks like a clear film over your incisions and will wear off a little at a time °• Steri-strips: Strips of tape over your incisions. You may notice a yellowish color on the skin under the steri-strips. This is used to make the   steri-strips stick better. Do not pull the steri-strips off - let them fall off °• Staples: Staples may be removed before you leave the hospital °o If you go home with staples, call [facility], [phone] at for an appointment with your surgeon’s nurse to have staples removed 10 days after surgery. °• Showering: You may shower two (2) days after your surgery unless your surgeon tells you differently °o Wash gently around incisions with warm soapy water, rinse well, and gently pat dry  °o No tub baths until staples are removed, steri-strips fall off or glue is gone.  °  °Medications: • Medications should be liquid or crushed if larger than the size of a dime °• Extended release pills (medication that release a little bit at a time through the day) should NOT be crushed or cut. (examples include XL, ER, DR, SR) °• Depending on the size and number of medications you take, you may need to space (take a few throughout the day)/change the time you take your medications so that you do not over-fill your pouch (smaller stomach) °• Make sure you follow-up with your primary care doctor to   make medication changes needed during rapid weight loss and life-style changes °• If you have diabetes, follow up with the doctor that orders your diabetes medication(s) within one week after surgery and check your blood sugar regularly. °• Do not drive while taking prescription pain medication  °• It is ok to take Tylenol by the bottle instructions with your pain medicine or instead of your pain medicine as needed.  DO NOT TAKE NSAIDS (EXAMPLES OF NSAIDS:  IBUPROFREN/ NAPROXEN)  °Diet:                    First 2 Weeks ° You will see the dietician t about two (2) weeks  after your surgery. The dietician will increase the types of foods you can eat if you are handling liquids well: °• If you have severe vomiting or nausea and cannot keep down clear liquids lasting longer than 1 day, call your surgeon @ ([phone]) °Protein Shake °• Drink at least 2 ounces of shake 5-6 times per day °• Each serving of protein shakes (usually 8 - 12 ounces) should have: °o 15 grams of protein  °o And no more than 5 grams of carbohydrate  °• Goal for protein each day: °o Men = 80 grams per day °o Women = 60 grams per day °• Protein powder may be added to fluids such as non-fat milk or Lactaid milk or unsweetened Soy/Almond milk (limit to 35 grams added protein powder per serving) ° °Hydration °• Slowly increase the amount of water and other clear liquids as tolerated (See Acceptable Fluids) °• Slowly increase the amount of protein shake as tolerated  °•  Sip fluids slowly and throughout the day.  Do not use straws. °• May use sugar substitutes in small amounts (no more than 6 - 8 packets per day; i.e. Splenda) ° °Fluid Goal °• The first goal is to drink at least 8 ounces of protein shake/drink per day (or as directed by the nutritionist); some examples of protein shakes are Syntrax Nectar, Adkins Advantage, EAS Edge HP, and Unjury. See handout from pre-op Bariatric Education Class: °o Slowly increase the amount of protein shake you drink as tolerated °o You may find it easier to slowly sip shakes throughout the day °o It is important to get your proteins in first °• Your fluid goal is to drink 64 - 100 ounces of fluid daily °o It may take a few weeks to build up to this °• 32 oz (or more) should be clear liquids  °And  °• 32 oz (or more) should be full liquids (see below for examples) °• Liquids should not contain sugar, caffeine, or carbonation ° °Clear Liquids: °• Water or Sugar-free flavored water (i.e. Fruit H2O, Propel) °• Decaffeinated coffee or tea (sugar-free) °• Crystal Lite, Wyler’s Lite,  Minute Maid Lite °• Sugar-free Jell-O °• Bouillon or broth °• Sugar-free Popsicle:   *Less than 20 calories each; Limit 1 per day ° °Full Liquids: °Protein Shakes/Drinks + 2 choices per day of other full liquids °• Full liquids must be: °o No More Than 15 grams of Carbs per serving  °o No More Than 3 grams of Fat per serving °• Strained low-fat cream soup (except Cream of Potato or Tomato) °• Non-Fat milk °• Fat-free Lactaid Milk °• Unsweetened Soy Or Unsweetened Almond Milk °• Low Sugar yogurt (Dannon Lite & Fit, Greek yogurt; Oikos Triple Zero; Chobani Simply 100; Yoplait 100 calorie Greek - No Fruit on the Bottom) ° °  °Vitamins   and Minerals • Start 1 day after surgery unless otherwise directed by your surgeon °• 2 Chewable Bariatric Specific Multivitamin / Multimineral Supplement with iron (Example: Bariatric Advantage Multi EA) °• Chewable Calcium with Vitamin D-3 °(Example: 3 Chewable Calcium Plus 600 with Vitamin D-3) °o Take 500 mg three (3) times a day for a total of 1500 mg each day °o Do not take all 3 doses of calcium at one time as it may cause constipation, and you can only absorb 500 mg  at a time  °o Do not mix multivitamins containing iron with calcium supplements; take 2 hours apart °• Menstruating women and those with a history of anemia (a blood disease that causes weakness) may need extra iron °o Talk with your doctor to see if you need more iron °• Do not stop taking or change any vitamins or minerals until you talk to your dietitian or surgeon °• Your Dietitian and/or surgeon must approve all vitamin and mineral supplements °  °Activity and Exercise: Limit your physical activity as instructed by your doctor.  It is important to continue walking at home.  During this time, use these guidelines: °• Do not lift anything greater than ten (10) pounds for at least two (2) weeks °• Do not go back to work or drive until your surgeon says you can °• You may have sex when you feel comfortable  °o It is  VERY important for female patients to use a reliable birth control method; fertility often increases after surgery  °o All hormonal birth control will be ineffective for 30 days after surgery due to medications given during surgery a barrier method must be used. °o Do not get pregnant for at least 18 months °• Start exercising as soon as your doctor tells you that you can °o Make sure your doctor approves any physical activity °• Start with a simple walking program °• Walk 5-15 minutes each day, 7 days per week.  °• Slowly increase until you are walking 30-45 minutes per day °Consider joining our BELT program. [phone] or email [email or web address] °  °Special Instructions Things to remember: °• Use your CPAP when sleeping if this applies to you ° °• [facility] has two free Bariatric Surgery Support Groups that meet monthly °o The 3rd Thursday of each month, 6 pm, [facility]  °o The 2nd Friday of each month, 11:45 am in the private dining room in the basement of [facility] °• It is very important to keep all follow up appointments with your surgeon, dietitian, primary care physician, and behavioral health practitioner °• Routine follow up schedule with your surgeon include appointments at 2-3 weeks, 6-8 weeks, 6 months, and 1 year at a minimum.  Your surgeon may request to see you more often.   °o After the first year, please follow up with your bariatric surgeon and dietitian at least once a year in order to maintain best weight loss results °[facility]: [phone] °[facility]: [phone] °Bariatric Nurse Coordinator: [phone] °  °   Reviewed and Endorsed  °by [place] Patient Education Committee, June, 2016 °Edits Approved: Aug, 2018 ° ° ° °

## 2019-02-08 NOTE — Progress Notes (Signed)
PHARMACY CONSULT FOR:  Risk Assessment for Post-Discharge VTE Following Bariatric Surgery  Post-Discharge VTE Risk Assessment: This patient's probability of 30-day post-discharge VTE is increased due to the factors marked:   Female    Age >/=60 years  X  BMI >/=50 kg/m2    CHF    Dyspnea at Rest    Paraplegia  X Non-gastric-band surgery    Operation Time >/=3 hr    Return to OR     Length of Stay >/= 3 d      Hx of VTE   Hypercoagulable condition   Significant venous stasis   Predicted probability of 30-day post-discharge VTE: 0.27%  Other patient-specific factors to consider: none   Recommendation for Discharge: No pharmacologic prophylaxis post-discharge  Sara [name] is a 43 y.o. female who underwent  laparoscopic sleeve gastrectomy11/16/2020    Allergies  Allergen Reactions  . Pork-Derived Products Other (See Comments)    Religious belief,  Pt agrees (after discussion of heparin containing pork derived product) to receive heparin for surgery 02/08/19    Patient Measurements: Height: 5\' 7"  (170.2 cm) Weight: (!) 338 lb 6 oz (153.5 kg) IBW/kg (Calculated) : 61.6 Body mass index is 53 kg/m.  No results for input(s): WBC, HGB, HCT, PLT, APTT, CREATININE, LABCREA, CREATININE, CREAT24HRUR, MG, PHOS, ALBUMIN, PROT, ALBUMIN, AST, ALT, ALKPHOS, BILITOT, BILIDIR, IBILI in the last 72 hours. Estimated Creatinine Clearance: 128 mL/min (by C-G formula based on SCr of 0.88 mg/dL).    Past Medical History:  Diagnosis Date  . ABDOMINAL PAIN 07/04/2009   Qualifier: Diagnosis of  By: Martinique MD, Sarah    . ALLERGIC RHINITIS 07/06/2008   Qualifier: Diagnosis of  By: Brigitte Pulse MD, Kimberlee    . Arthritis   . Bilateral leg numbness 07/2016  . Bulimia nervosa 08/22/2009   Qualifier: Diagnosis of  By: Martinique MD, Sarah    . 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 Class 3 obesity without serious comorbidity with body mass index (BMI) of 50.0 to 59.9 in adult   . DEPRESSION, SEVERE  08/09/2009   Qualifier: Diagnosis of  By: Martinique MD, Sarah    . ECZEMA, ATOPIC 08/30/2009   Qualifier: Diagnosis of  By: Martinique MD, Sarah    . GERD (gastroesophageal reflux disease)    Mild  . GRIEF REACTION, ACUTE 07/04/2009   Qualifier: Diagnosis of  By: Martinique MD, Sarah    . Heart murmur   . Hereditary and idiopathic peripheral neuropathy 07/23/2016   01/12/2019 not currently having problems  . History of hiatal hernia    Small  . Iron deficiency anemia 07/2016  . KNEE PAIN, RIGHT 11/01/2008   Qualifier: Diagnosis of  By: Oneida Alar MD, KARL    . Lower extremity edema   . MUSCLE CRAMPS 07/04/2009   Qualifier: Diagnosis of  By: Martinique MD, Sarah    . OSA (obstructive sleep apnea)    recent diagnosis, no CPAP received at this time   . Pallor 07/17/2009   Qualifier: Diagnosis of  By: Martinique MD, Sarah    . Paresthesia 09/04/2016  . Plantar fasciitis, left 09/10/2011  . PONV (postoperative nausea and vomiting)    nausea only  . 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 PRURITUS, EARS 07/06/2008   Qualifier: Diagnosis of  By: Brigitte Pulse MD, Kimberlee    . Severe obesity (BMI >= 40) ([place]) 07/06/2008   Qualifier: Diagnosis of  By: Brigitte Pulse MD, Kimberlee    .  SUBACROMIAL BURSITIS, RIGHT 07/04/2009   Qualifier: Diagnosis of  By: Rexene Alberts  MD, Aurther Loft       Medications Prior to Admission  Medication Sig Dispense Refill Last Dose  . acetaminophen (TYLENOL) 500 MG tablet Take 1,000 mg by mouth every 6 (six) hours as needed for moderate pain or headache.   01/31/2019  . furosemide (LASIX) 20 MG tablet Take 1 tablet (20 mg total) by mouth daily. (Patient taking differently: Take 20 mg by mouth 2 (two) times daily. ) 30 tablet 3 02/05/2019 at 0900  . simethicone (MYLICON) 80 MG chewable tablet Chew 80 mg by mouth every 6 (six) hours as needed for flatulence.   01/31/2019  . ondansetron (ZOFRAN-ODT) 4 MG disintegrating tablet Take 4 mg by mouth every 8 (eight) hours as needed for  nausea/vomiting.     . pantoprazole (PROTONIX) 40 MG tablet Take 40 mg by mouth daily.        Herby Abraham, Pharm.D [phone] 02/08/2019 4:27 PM

## 2019-02-08 NOTE — Anesthesia Preprocedure Evaluation (Signed)
Anesthesia Evaluation  Patient identified by MRN, date of birth, ID band Patient awake    Reviewed: Allergy & Precautions, NPO status , Patient's Chart, lab work & pertinent test results  History of Anesthesia Complications (+) PONV  Airway Mallampati: II  TM Distance: >3 FB Neck ROM: Full    Dental  (+) Dental Advisory Given   Pulmonary sleep apnea ,    breath sounds clear to auscultation       Cardiovascular hypertension,  Rhythm:Regular Rate:Normal     Neuro/Psych  Neuromuscular disease    GI/Hepatic Neg liver ROS, hiatal hernia, GERD  ,  Endo/Other  Morbid obesity  Renal/GU negative Renal ROS     Musculoskeletal  (+) Arthritis ,   Abdominal   Peds  Hematology negative hematology ROS (+)   Anesthesia Other Findings   Reproductive/Obstetrics                             Lab Results  Component Value Date   WBC 8.1 02/03/2019   HGB 12.8 02/03/2019   HCT 39.6 02/03/2019   MCV 70.5 (L) 02/03/2019   PLT 343 02/03/2019   Lab Results  Component Value Date   CREATININE 0.88 02/03/2019   BUN 16 02/03/2019   NA 136 02/03/2019   K 3.6 02/03/2019   CL 102 02/03/2019   CO2 23 02/03/2019    Anesthesia Physical Anesthesia Plan  ASA: III  Anesthesia Plan:    Post-op Pain Management:    Induction: Intravenous  PONV Risk Score and Plan: 4 or greater and Dexamethasone, Ondansetron, Scopolamine patch - Pre-op, Midazolam and Treatment may vary due to age or medical condition  Airway Management Planned: Oral ETT  Additional Equipment:   Intra-op Plan:   Post-operative Plan: Extubation in OR  Informed Consent: I have reviewed the patients History and Physical, chart, labs and discussed the procedure including the risks, benefits and alternatives for the proposed anesthesia with the patient or authorized representative who has indicated his/her understanding and acceptance.      Dental advisory given  Plan Discussed with: CRNA  Anesthesia Plan Comments:         Anesthesia Quick Evaluation

## 2019-02-08 NOTE — Op Note (Addendum)
PATIENT:   Sara [name] DOB:   March 09, 1976 MRN:   [id number]  DATE OF PROCEDURE: 02/08/2019                   FACILITY:  [facility]  OPERATIVE REPORT  PREOPERATIVE DIAGNOSIS:  Morbid obesity.  POSTOPERATIVE DIAGNOSIS:  Morbid obesity (weight 350, BMI of 56.5).  PROCEDURE:  Laparoscopic sleeve gastrectomy (intraoperative upper endoscopy by Dr. Sheliah Hatch)  SURGEON:  Sandria Bales. Ezzard Standing, MD  FIRST ASSISTANTGena Fray, MD  ANESTHESIA:  General endotracheal.  Anesthesiologist: Marcene Duos, MD CRNA: Elisabeth Cara, CRNA; Nelle Don, CRNA  General  ESTIMATED BLOOD LOSS:  Minimal.  LOCAL ANESTHESIA:  30 cc of 1/4% Marcaine and 20 cc of Exparel  COMPLICATIONS:  None.  INDICATION FOR SURGERY:  Sara [name] is a 43 y.o. AA female who sees Nestor Ramp, MD as her primary care doctor.  She has completed our preoperative bariatric program and now comes for a laparoscopic sleeve gastrectomy.  The indications, potential complications of surgery were explained to the patient.  Potential complications of the surgery include, but are not limited to, bleeding, infection, DVT, open surgery, and long-term nutritional consequences.  OPERATIVE NOTE:  The patient taken to room #1 at [facility] where Sara [name] underwent a general endotracheal anesthetic, supervised by Anesthesiologist: Marcene Duos, MD CRNA: Elisabeth Cara, CRNA; Nelle Don, CRNA.  The patient was given 2 g of cefotetan at the beginning of the procedure.  A time-out was held and surgical checklist run.  I accessed her abdominal cavity through the left upper quadrant with a 5 mm Optiview. I did an abdominal exploration.   Her omentum and bowel were unremarkable. The right and left lobes of the liver unremarkable. Gallbladder was normal. Her stomach was unremarkable.   I placed a total of 6 trocars. I placed a 5 mm left lateral trocar, a 5 mm left paramedian trocar (for the scope), a 12 mm right paramedian  torcar, a 5 mm right subcostal trocar that I converted to a 15 mm to extract the stomach and 5 mm subxiphoid trocar for the liver retractor.  I placed a abdominal wall anesthetic block using a mixture of 1/4% Marcaine and Exparel.  I used 15 cc per side, for a total of 40 cc.  There was a question of a hiatal hernia on the pre op UGI.  I did use the sizing tube, insufflated 10 cc of air in the balloon, and pulled it back to the hiatus.  There was no evidence of a hiatal hernia.  I started out taking down the greater curvature attachments of the stomach. I measured approximately 6 cm proximal from the pylorus and mobilized the greater curvature of the stomach with the Harmonic Scalpel. I took this dissection cranially around the greater curvature of her stomach to the angle of His and the left crus.  She did have attachments close to the spleen.   After I had mobilized the greater curvature of the stomach, I then passed the 36 French ViSiGi bougie which was used to suck the stomach up against the lesser curvature and placed into the antrum. During the staple firing,  I tried to give the ViSiGi a cuff at least about 1 cm. I tried to avoid narrowing the incisura. I used a total of 6 staple firings.  From antrum to the angle of His, I used 2 green, 1 gold and 3 blue Eschelon 60 mm Ethicon staplers. I did  not use staple line reinforcement.   At each firing of the EndoGIA stapler, I inspected the stomach, anterior wall of the stomach, and underneath to make sure there was no compromise or impingement on to the ViSiGi bougie.   The staple line seemed linear without any corkscrewing of the stomach. Hemostasis was good. I did not use any reinforcement. She did have at least 3 areas of bleeding which I used clips to clip on the new greater curvature of the stomach.  Because I thought we had a good staple line, I then had the ViSiGi was converted to insufflate the pouch. The new stomach pouch was placed under  water. There was no bubbling or leak noted.   At this point, Dr. Kieth Brightly broke scrub and passed an upper endoscope down through the esophagus into the stomach pouch. The stomach was tubular. There was no narrowing of the stomach pouch or angulation. We were easily able to pass the endoscope into the antrum and again put air pressure on the staple line. I irrigated the upper abdomen with saline. There was no bubbling or evidence of air leak. The mucosa looked viable. Dr. Kieth Brightly decompressed the stomach with the endoscopy.   I converted to right subcostal trocar to a 15 trocar and extracted the stomach remnant through this intact and sent this to pathology. I then placed 10 cc of Tisseel along the new greater curvature staple line and covered the entire staple line with the Tisseel.  I aspirated out the saline that I had irrigated because I thought the staple line looked viable and complete. There was no evidence of leak. I did not leave a drain in place.   I closed the skin at each site with a 4-0 Monocryl, painted each wound with Dermabond.   The patient was transported to recovery room in good condition. Sponge and needle count were correct at the end of the case.     Sleeve gastrectomy before and after [place]   [name], MD, [facility] Pager: [phone] Office phone:  [phone]

## 2019-02-08 NOTE — Transfer of Care (Signed)
Immediate Anesthesia Transfer of Care Note  Patient: Sara [name]  Procedure(s) Performed: LAPAROSCOPIC GASTRIC SLEEVE RESECTION, Uper Endo, Eras Pathway (N/A )  Patient Location: PACU  Anesthesia Type:General  Level of Consciousness: awake, alert  and oriented  Airway & Oxygen Therapy: Patient Spontanous Breathing and Patient connected to face mask oxygen  Post-op Assessment: Report given to RN, Post -op Vital signs reviewed and stable and Patient moving all extremities X 4  Post vital signs: Reviewed and stable  Last Vitals:  Vitals Value Taken Time  BP    Temp    Pulse 102 02/08/19 1452  Resp 24 02/08/19 1452  SpO2 100 % 02/08/19 1452  Vitals shown include unvalidated device data.  Last Pain:  Vitals:   02/08/19 1128  TempSrc:   PainSc: 3       Patients Stated Pain Goal: 4 (13/08/65 7846)  Complications: No apparent anesthesia complications

## 2019-02-09 ENCOUNTER — Encounter (HOSPITAL_COMMUNITY): Payer: Self-pay | Admitting: Surgery

## 2019-02-09 LAB — CBC WITH DIFFERENTIAL/PLATELET
Abs Immature Granulocytes: 0.05 10*3/uL (ref 0.00–0.07)
Basophils Absolute: 0 10*3/uL (ref 0.0–0.1)
Basophils Relative: 0 %
Eosinophils Absolute: 0 10*3/uL (ref 0.0–0.5)
Eosinophils Relative: 0 %
HCT: 32.2 % — ABNORMAL LOW (ref 36.0–46.0)
Hemoglobin: 10.5 g/dL — ABNORMAL LOW (ref 12.0–15.0)
Immature Granulocytes: 1 %
Lymphocytes Relative: 15 %
Lymphs Abs: 1.6 10*3/uL (ref 0.7–4.0)
MCH: 22.3 pg — ABNORMAL LOW (ref 26.0–34.0)
MCHC: 32.6 g/dL (ref 30.0–36.0)
MCV: 68.4 fL — ABNORMAL LOW (ref 80.0–100.0)
Monocytes Absolute: 0.8 10*3/uL (ref 0.1–1.0)
Monocytes Relative: 7 %
Neutro Abs: 8.7 10*3/uL — ABNORMAL HIGH (ref 1.7–7.7)
Neutrophils Relative %: 77 %
Platelets: 321 10*3/uL (ref 150–400)
RBC: 4.71 MIL/uL (ref 3.87–5.11)
RDW: 15.9 % — ABNORMAL HIGH (ref 11.5–15.5)
WBC: 11.1 10*3/uL — ABNORMAL HIGH (ref 4.0–10.5)
nRBC: 0 % (ref 0.0–0.2)

## 2019-02-09 LAB — SURGICAL PATHOLOGY

## 2019-02-09 MED ORDER — SIMETHICONE 80 MG PO CHEW
80.0000 mg | CHEWABLE_TABLET | Freq: Four times a day (QID) | ORAL | Status: DC | PRN
  Administered 2019-02-09 – 2019-02-10 (×2): 80 mg via ORAL
  Filled 2019-02-09 (×2): qty 1

## 2019-02-09 NOTE — Progress Notes (Signed)
Patient alert and oriented, Post op day 1.  Provided support and encouragement.  Encouraged pulmonary toilet, ambulation and small sips of liquids.  All questions answered.  Will continue to monitor. 

## 2019-02-09 NOTE — Progress Notes (Signed)
Nutrition Note  RD consulted for diet education for patient s/p bariatric surgery. Bariatric nurse coordinator providing education at this time.  If nutrition issues arise, please consult RD.   Wenceslaus Gist, MS, RD, LDN Inpatient Clinical Dietitian Pager: [phone] After Hours Pager: [phone]  

## 2019-02-09 NOTE — Progress Notes (Signed)
[facility]:  [phone] General Surgery Progress Note   LOS: 1 day  POD -  1 Day Post-Op  Chief Complaint: Morbid obesity  Assessment and Plan: 1.  LAPAROSCOPIC GASTRIC SLEEVE RESECTION, Uper Endo - 02/08/2019 [name]  Some nausea - not quite doing well enough with po's to go home.  Will keep one more day.  2.  History of depression 3.  Her lower back bothers her 4.   DVT prophylaxis - Lovenox   Active Problems:   Morbid obesity with BMI of 50.0-59.9, adult (HCC)  Subjective:  Nauseated, but taking some po's.  Not quite ready to go home.  Husband at bedside.  Objective:   Vitals:   02/09/19 1003 02/09/19 1119  BP: (!) 155/95   Pulse: 83   Resp: 17   Temp:  98.9 F (37.2 C)  SpO2: 100%      Intake/Output from previous day:  11/16 0701 - 11/17 0700 In: 2659.1 [P.O.:300; I.V.:2259.1; IV Piggyback:100] Out: 2710 [Urine:2700; Blood:10]  Intake/Output this shift:  Total I/O In: 586.6 [P.O.:180; I.V.:406.6] Out: 300 [Urine:300]   Physical Exam:   General: Obese AA F who is alert and oriented.    HEENT: Normal. Pupils equal. .   Lungs: Clear   Abdomen: Soft, rare BS.   Wound: Look good   Lab Results:    Recent Labs    02/08/19 1802 02/09/19 0424  WBC  --  11.1*  HGB 11.8* 10.5*  HCT 37.2 32.2*  PLT  --  321    BMET  No results for input(s): NA, K, CL, CO2, GLUCOSE, BUN, CREATININE, CALCIUM in the last 72 hours.  PT/INR  No results for input(s): LABPROT, INR in the last 72 hours.  ABG  No results for input(s): PHART, HCO3 in the last 72 hours.  Invalid input(s): PCO2, PO2   Studies/Results:  No results found.   Anti-infectives:   Anti-infectives (From admission, onward)   Start     Dose/Rate Route Frequency Ordered Stop   02/08/19 1130  cefoTEtan (CEFOTAN) 2 g in sodium chloride 0.9 % 100 mL IVPB     2 g 200 mL/hr over 30 Minutes Intravenous On call to O.R. 02/08/19 1127 02/08/19 [place], MD,  [facility] Surgery Office: [phone] 02/09/2019

## 2019-02-09 NOTE — Anesthesia Postprocedure Evaluation (Signed)
Anesthesia Post Note  Patient: Sara [name]  Procedure(s) Performed: LAPAROSCOPIC GASTRIC SLEEVE RESECTION, Uper Endo, E[facility] Pathway (N/A )     Patient location during evaluation: PACU Anesthesia Type: General Level of consciousness: awake and alert Pain management: pain level controlled Vital Signs Assessment: post-procedure vital signs reviewed and stable Respiratory status: spontaneous breathing, nonlabored ventilation, respiratory function stable and patient connected to nasal cannula oxygen Cardiovascular status: blood pressure returned to baseline and stable Postop Assessment: no apparent nausea or vomiting Anesthetic complications: no    Last Vitals:  Vitals:   02/09/19 1003 02/09/19 1119  BP: (!) 155/95   Pulse: 83   Resp: 17   Temp:  37.2 C  SpO2: 100%     Last Pain:  Vitals:   02/09/19 1119  TempSrc: Oral  PainSc:                  [name]

## 2019-02-10 LAB — CBC WITH DIFFERENTIAL/PLATELET
Abs Immature Granulocytes: 0.02 10*3/uL (ref 0.00–0.07)
Basophils Absolute: 0 10*3/uL (ref 0.0–0.1)
Basophils Relative: 0 %
Eosinophils Absolute: 0 10*3/uL (ref 0.0–0.5)
Eosinophils Relative: 0 %
HCT: 32.4 % — ABNORMAL LOW (ref 36.0–46.0)
Hemoglobin: 10.6 g/dL — ABNORMAL LOW (ref 12.0–15.0)
Immature Granulocytes: 0 %
Lymphocytes Relative: 35 %
Lymphs Abs: 2.7 10*3/uL (ref 0.7–4.0)
MCH: 22.7 pg — ABNORMAL LOW (ref 26.0–34.0)
MCHC: 32.7 g/dL (ref 30.0–36.0)
MCV: 69.5 fL — ABNORMAL LOW (ref 80.0–100.0)
Monocytes Absolute: 0.7 10*3/uL (ref 0.1–1.0)
Monocytes Relative: 10 %
Neutro Abs: 4.1 10*3/uL (ref 1.7–7.7)
Neutrophils Relative %: 55 %
Platelets: 320 10*3/uL (ref 150–400)
RBC: 4.66 MIL/uL (ref 3.87–5.11)
RDW: 16.2 % — ABNORMAL HIGH (ref 11.5–15.5)
WBC: 7.6 10*3/uL (ref 4.0–10.5)
nRBC: 0 % (ref 0.0–0.2)

## 2019-02-10 MED ORDER — OXYCODONE HCL 5 MG/5ML PO SOLN: 5.0000 mg | Freq: Four times a day (QID) | ORAL | 0 refills | Status: DC | PRN

## 2019-02-10 NOTE — Plan of Care (Signed)
All goals met for d/c.

## 2019-02-10 NOTE — Progress Notes (Signed)

## 2019-02-10 NOTE — Progress Notes (Signed)
Patient has walked 3 laps around unit tonight. She tolerated this well.

## 2019-02-10 NOTE — Progress Notes (Signed)
Pt alert and oriented, tolerating liquids.  D/C instructions given by Parks Neptune, RN.  Pt d/cd home.

## 2019-02-10 NOTE — Discharge Summary (Signed)
Physician Discharge Summary  Patient ID:  Sara AnisDaaimah A Redinger  MRN: [id number]  DOB/AGE: [birth year] 43 y.o.  Admit date: 02/08/2019 Discharge date: 02/10/2019  Discharge Diagnoses:  1.  Morbid obesity  Weight - 350, BMI - 56.5 2.  History of depression 3. Knee issues Right knee arthroscopy - 05/05/2015 - [facility] 4. Her lower back bothers her   Active Problems:   Morbid obesity with BMI of 50.0-59.9, adult [facility](HCC)  Operation: Procedure(s): LAPAROSCOPIC GASTRIC SLEEVE RESECTION, Uper Endo on 02/08/2019 Ezzard Standing- Bartolo Montanye  Discharged Condition: good  Hospital Course: Sara [name] is an 43 y.o. female whose primary care physician is Nestor RampNeal, Sara L, MD and who was admitted 02/08/2019 with a chief complaint of morbid obesity.   She was brought to the operating room on 02/08/2019 and underwent LAPAROSCOPIC GASTRIC SLEEVE RESECTION, Uper Endo.   She was fairly nauseated the first post op day and not ready to go home. Today, she is doing much better. She is ready for discharge.  The discharge instructions were reviewed with the patient.  Consults: None  Significant Diagnostic Studies: Results for orders placed or performed during the hospital encounter of 02/08/19  Pregnancy, urine  Result Value Ref Range   Preg Test, Ur NEGATIVE NEGATIVE  Hemoglobin and hematocrit, blood  Result Value Ref Range   Hemoglobin 11.8 (L) 12.0 - 15.0 g/dL   HCT 87.537.2 64.336.0 - 32.946.0 %  CBC WITH DIFFERENTIAL  Result Value Ref Range   WBC 11.1 (H) 4.0 - 10.5 K/uL   RBC 4.71 3.87 - 5.11 MIL/uL   Hemoglobin 10.5 (L) 12.0 - 15.0 g/dL   HCT 51.832.2 (L) 84.136.0 - 66.046.0 %   MCV 68.4 (L) 80.0 - 100.0 fL   MCH 22.3 (L) 26.0 - 34.0 pg   MCHC 32.6 30.0 - 36.0 g/dL   RDW 63.015.9 (H) 16.011.5 - 10.915.5 %   Platelets 321 150 - 400 K/uL   nRBC 0.0 0.0 - 0.2 %   Neutrophils Relative % 77 %   Neutro Abs 8.7 (H) 1.7 - 7.7 K/uL   Lymphocytes Relative 15 %   Lymphs Abs 1.6 0.7 - 4.0 K/uL   Monocytes Relative 7 %   Monocytes Absolute 0.8 0.1  - 1.0 K/uL   Eosinophils Relative 0 %   Eosinophils Absolute 0.0 0.0 - 0.5 K/uL   Basophils Relative 0 %   Basophils Absolute 0.0 0.0 - 0.1 K/uL   Immature Granulocytes 1 %   Abs Immature Granulocytes 0.05 0.00 - 0.07 K/uL  CBC with Differential  Result Value Ref Range   WBC 7.6 4.0 - 10.5 K/uL   RBC 4.66 3.87 - 5.11 MIL/uL   Hemoglobin 10.6 (L) 12.0 - 15.0 g/dL   HCT 32.332.4 (L) 55.736.0 - 32.246.0 %   MCV 69.5 (L) 80.0 - 100.0 fL   MCH 22.7 (L) 26.0 - 34.0 pg   MCHC 32.7 30.0 - 36.0 g/dL   RDW 02.516.2 (H) 42.711.5 - 06.215.5 %   Platelets 320 150 - 400 K/uL   nRBC 0.0 0.0 - 0.2 %   Neutrophils Relative % 55 %   Neutro Abs 4.1 1.7 - 7.7 K/uL   Lymphocytes Relative 35 %   Lymphs Abs 2.7 0.7 - 4.0 K/uL   Monocytes Relative 10 %   Monocytes Absolute 0.7 0.1 - 1.0 K/uL   Eosinophils Relative 0 %   Eosinophils Absolute 0.0 0.0 - 0.5 K/uL   Basophils Relative 0 %   Basophils Absolute 0.0 0.0 - 0.1 K/uL  Immature Granulocytes 0 %   Abs Immature Granulocytes 0.02 0.00 - 0.07 K/uL   Target Cells PRESENT   Surgical pathology  Result Value Ref Range   SURGICAL PATHOLOGY      SURGICAL PATHOLOGY CASE: [id number] PATIENT: [facility] Surgical Pathology Report     Clinical History: Morbid obesity (cm)     FINAL MICROSCOPIC DIAGNOSIS:  A. STOMACH, GASTRIC SLEEVE RESECTION: -Gross diagnosis only: Portion of unremarkable stomach   GROSS DESCRIPTION:  The specimen is received fresh and consists of a 21.0 x 4.0 x 2.0 cm portion of tan-pink soft tissue with a stapled resection margin.  The serosal surface is smooth, hyperemic, with a small amount of attached tan-yellow adipose tissue.  The stomach is filled with a moderate amount of red-brown, hemorrhagic gelatinous material.  Mucosa is tan-pink, hyperemic, with normal folding.  Mucosa is moderately disrupted, and no lesions are grossly identified.  Wall averages 0.2 cm in thickness.  No sections are submitted, gross exam only. Lovey Newcomer  02/08/2019)    Final Diagnosis performed by Valinda Hoar, MD.   Electronically signed 02/09/2019 Technical and / or Professional components performed  at [facility], [address], [place], [place] [address].  Immunohistochemistry Technical component (if applicable) was performed at [facility]. [address], [address], [place], [place] [address].   IMMUNOHISTOCHEMISTRY DISCLAIMER (if applicable): Some of these immunohistochemical stains may have been developed and the performance characteristics determine by [facility]. Some may not have been cleared or approved by the U.S. Food and Drug Administration. The FDA has determined that such clearance or approval is not necessary. This test is used for clinical purposes. It should not be regarded as investigational or for research. This laboratory is certified under the Clinical Laboratory Improvement Amendments of 1988 (CLIA-88) as qualified to perform high complexity clinical laboratory testing.  The controls stained appropriately.     Dg Chest 2 View  Result Date: 01/12/2019 CLINICAL DATA:  43 year old female for preoperative evaluation. EXAM: CHEST - 2 VIEW COMPARISON:  Chest radiograph dated 07/25/2016 FINDINGS: The heart size and mediastinal contours are within normal limits. Both lungs are clear. The visualized skeletal structures are unremarkable. IMPRESSION: No active cardiopulmonary disease. Electronically Signed   By: Elgie Collard M.D.   On: 01/12/2019 09:17    Discharge Exam:  Vitals:   02/10/19 0102 02/10/19 0633  BP: (!) 169/85 (!) 144/82  Pulse: 86 80  Resp: (!) 22 20  Temp: 98.7 F (37.1 C) 98.9 F (37.2 C)  SpO2: 97% 96%    General: Obese AA F who is alert and generally healthy appearing.  Lungs: Clear to auscultation and symmetric breath sounds. Heart:  RRR. No murmur or rub. Abdomen: Soft.  No hernia. Normal bowel sounds. Wounds look  good.  Discharge Medications:   Allergies as of 02/10/2019      Reactions   Pork-derived Products Other (See Comments)   Religious belief,  Pt agrees (after discussion of heparin containing pork derived product) to receive heparin for surgery 02/08/19      Medication List    TAKE these medications   acetaminophen 500 MG tablet Commonly known as: TYLENOL Take 1,000 mg by mouth every 6 (six) hours as needed for moderate pain or headache.   furosemide 20 MG tablet Commonly known as: LASIX Take 1 tablet (20 mg total) by mouth daily. What changed: when to take this   ondansetron 4 MG disintegrating tablet Commonly known as: ZOFRAN-ODT Take 4 mg  by mouth every 8 (eight) hours as needed for nausea/vomiting.   oxyCODONE 5 MG/5ML solution Commonly known as: ROXICODONE Take 5 mLs (5 mg total) by mouth every 6 (six) hours as needed for moderate pain or severe pain.   pantoprazole 40 MG tablet Commonly known as: PROTONIX Take 40 mg by mouth daily.   simethicone 80 MG chewable tablet Commonly known as: MYLICON Chew 80 mg by mouth every 6 (six) hours as needed for flatulence.       Disposition: Discharge disposition: 01-Home or Self Care       Discharge Instructions    Ambulate hourly while awake   Complete by: As directed    Call MD for:  difficulty breathing, headache or visual disturbances   Complete by: As directed    Call MD for:  persistant dizziness or light-headedness   Complete by: As directed    Call MD for:  persistant nausea and vomiting   Complete by: As directed    Call MD for:  redness, tenderness, or signs of infection (pain, swelling, redness, odor or green/yellow discharge around incision site)   Complete by: As directed    Call MD for:  severe uncontrolled pain   Complete by: As directed    Call MD for:  temperature >101 F   Complete by: As directed    Diet bariatric full liquid   Complete by: As directed    Incentive spirometry   Complete by: As  directed    Perform hourly while awake      Follow-up Information    [name], MD. Go on 03/05/2019.   Specialty: General Surgery Why: at [address] Contact information: [address] [place] [address] [phone]        Carlena Hurl, PA-C. Go on 04/01/2019.   Specialty: General Surgery Why: at [address] information: [address] [place]  [address] [phone]            Signed: [name], M.D., [facility] Surgery Office:  [phone]  02/10/2019, 7:56 AM

## 2019-02-10 NOTE — Progress Notes (Signed)
Patient has completed water and protein. Patient has demonstrated use of IS. Patient would like to wait to ambulate later since she walked before shift change. Will continue to monitor.

## 2019-02-11 ENCOUNTER — Emergency Department (HOSPITAL_COMMUNITY)
Admission: EM | Admit: 2019-02-11 | Discharge: 2019-02-11 | Disposition: A | Discharge status: Home / Self Care | Payer: BC Managed Care – PPO | Attending: Emergency Medicine | Admitting: Emergency Medicine

## 2019-02-11 ENCOUNTER — Ambulatory Visit: Payer: Self-pay

## 2019-02-11 ENCOUNTER — Other Ambulatory Visit: Payer: Self-pay

## 2019-02-11 ENCOUNTER — Emergency Department (HOSPITAL_COMMUNITY): Payer: BC Managed Care – PPO

## 2019-02-11 DIAGNOSIS — R071 Chest pain on breathing: Secondary | ICD-10-CM | POA: Diagnosis not present

## 2019-02-11 DIAGNOSIS — R079 Chest pain, unspecified: Secondary | ICD-10-CM | POA: Diagnosis not present

## 2019-02-11 DIAGNOSIS — R000 Tachycardia, unspecified: Secondary | ICD-10-CM | POA: Diagnosis not present

## 2019-02-11 DIAGNOSIS — Z79899 Other long term (current) drug therapy: Secondary | ICD-10-CM | POA: Insufficient documentation

## 2019-02-11 DIAGNOSIS — R0789 Other chest pain: Secondary | ICD-10-CM | POA: Diagnosis not present

## 2019-02-11 LAB — CBC
HCT: 39.2 % (ref 36.0–46.0)
Hemoglobin: 13 g/dL (ref 12.0–15.0)
MCH: 23 pg — ABNORMAL LOW (ref 26.0–34.0)
MCHC: 33.2 g/dL (ref 30.0–36.0)
MCV: 69.5 fL — ABNORMAL LOW (ref 80.0–100.0)
Platelets: 261 10*3/uL (ref 150–400)
RBC: 5.64 MIL/uL — ABNORMAL HIGH (ref 3.87–5.11)
RDW: 16.6 % — ABNORMAL HIGH (ref 11.5–15.5)
WBC: 4.5 10*3/uL (ref 4.0–10.5)
nRBC: 0 % (ref 0.0–0.2)

## 2019-02-11 LAB — BASIC METABOLIC PANEL
Anion gap: 12 (ref 5–15)
BUN: 10 mg/dL (ref 6–20)
CO2: 26 mmol/L (ref 22–32)
Calcium: 8.9 mg/dL (ref 8.9–10.3)
Chloride: 99 mmol/L (ref 98–111)
Creatinine, Ser: 0.95 mg/dL (ref 0.44–1.00)
GFR calc Af Amer: >60 mL/min (ref >60)
GFR calc non Af Amer: >60 mL/min (ref >60)
Glucose, Bld: 82 mg/dL (ref 70–99)
Potassium: 3.6 mmol/L (ref 3.5–5.1)
Sodium: 137 mmol/L (ref 135–145)

## 2019-02-11 LAB — TROPONIN I (HIGH SENSITIVITY)
Troponin I (High Sensitivity): <2 ng/L (ref <18)
Troponin I (High Sensitivity): <2 ng/L (ref <18)

## 2019-02-11 MED ORDER — KETOROLAC TROMETHAMINE 30 MG/ML IJ SOLN
30.0000 mg | Freq: Once | INTRAMUSCULAR | Status: AC
  Administered 2019-02-11: 30 mg via INTRAVENOUS
  Filled 2019-02-11: qty 1

## 2019-02-11 MED ORDER — SODIUM CHLORIDE 0.9% FLUSH: 3.0000 mL | Freq: Once | INTRAVENOUS | Status: DC

## 2019-02-11 MED ORDER — IOHEXOL 350 MG/ML SOLN
100.0000 mL | Freq: Once | INTRAVENOUS | Status: AC | PRN
  Administered 2019-02-11: 20:00:00 100 mL via INTRAVENOUS

## 2019-02-11 MED ORDER — MORPHINE SULFATE (PF) 4 MG/ML IV SOLN
4.0000 mg | Freq: Once | INTRAVENOUS | Status: DC
  Filled 2019-02-11: qty 1

## 2019-02-11 NOTE — ED Notes (Signed)
Pt upset that no one had been in to discuss her blood pressure. Pt also upset that her daughter was not allowed back. Pt informed that her BP was being monitored at nurses' station, and that it was not at a dangerous level. This RN also adjusted cuff. Pt informed that they were r/o COVID, and wanted to see xray and labs before determining. If determined negative, pt can have visitors. Pt also was upset for delay in response to call light response. This RN apologized to pt, as it took her about 15 minutes to come to room and dress out.

## 2019-02-11 NOTE — ED Notes (Signed)
EKG given to Dr. Wentz 

## 2019-02-11 NOTE — Telephone Encounter (Signed)
Pt. Reports she had bariatric surgery this week and came home yesterday. Started having chest pain this morning. Reports teeth hurt, hurts with breathing. Has taken a "pain pill and it has not helped.It's been hurting for several hours." Will have her daughter take her to the ED.  Reason for Disposition . Major surgery in the past month  Answer Assessment - Initial Assessment Questions 1. LOCATION: "Where does it hurt?"       Middle 2. RADIATION: "Does the pain go anywhere else?" (e.g., into neck, jaw, arms, back)     Teeth 3. ONSET: "When did the chest pain begin?" (Minutes, hours or days)      This morning 4. PATTERN "Does the pain come and go, or has it been constant since it started?"  "Does it get worse with exertion?"      Constant 5. DURATION: "How long does it last" (e.g., seconds, minutes, hours)     Hours 6. SEVERITY: "How bad is the pain?"  (e.g., Scale 1-10; mild, moderate, or severe)    - MILD (1-3): doesn't interfere with normal activities     - MODERATE (4-7): interferes with normal activities or awakens from sleep    - SEVERE (8-10): excruciating pain, unable to do any normal activities       6 7. CARDIAC RISK FACTORS: "Do you have any history of heart problems or risk factors for heart disease?" (e.g., angina, prior heart attack; diabetes, high blood pressure, high cholesterol, smoker, or strong family history of heart disease)     No 8. PULMONARY RISK FACTORS: "Do you have any history of lung disease?"  (e.g., blood clots in lung, asthma, emphysema, birth control pills)     No 9. CAUSE: "What do you think is causing the chest pain?"     Unsure 10. OTHER SYMPTOMS: "Do you have any other symptoms?" (e.g., dizziness, nausea, vomiting, sweating, fever, difficulty breathing, cough)       Cough, fever last night 11. PREGNANCY: "Is there any chance you are pregnant?" "When was your last menstrual period?"       No  Protocols used: CHEST PAIN-A-AH

## 2019-02-11 NOTE — ED Notes (Signed)
Pt called out for chest pain. Pt described the pain as a sharp pain 6 out of 10 in middle of chest. MD made aware.

## 2019-02-11 NOTE — ED Triage Notes (Signed)
Pt presents with c/o chest pain that started this morning. Pt was just discharged from the hospital yesterday after having surgery, gastric sleeve. Pt reports she was febrile last night, 100.4 was the highest temp. Pt reports she has been taking medicine at home for the fever. Pt reports the pain is in the center of her chest.

## 2019-02-11 NOTE — ED Provider Notes (Signed)
[facility]-EMERGENCY DEPT Provider Note   CSN: [id number] Arrival date & time: 02/11/19  1340     History   Chief Complaint Chief Complaint  Patient presents with  . Chest Pain    HPI Sara [name] is a 43 y.o. female.     HPI Patient presents to the emergency department with chest pain that started early this morning.  The patient states she had surgery 2 days ago.  Patient states that she was told to come here for further evaluation of the chest pain.  Patient states that she did not take any medications prior to arrival for symptoms.  Patient states that nothing seems make her condition better or worse.  Patient states deep breathing did seem to bother it somewhat.  The patient denies shortness of breath, headache,blurred vision, neck pain, fever, cough, weakness, numbness, dizziness, anorexia, edema, abdominal pain, nausea, vomiting, diarrhea, rash, back pain, dysuria, hematemesis, bloody stool, near syncope, or syncope. Past Medical History:  Diagnosis Date  . ABDOMINAL PAIN 07/04/2009   Qualifier: Diagnosis of  By: [place] MD, Sarah    . ALLERGIC RHINITIS 07/06/2008   Qualifier: Diagnosis of  By: Clelia Croft MD, Kimberlee    . Arthritis   . Bilateral leg numbness 07/2016  . Bulimia nervosa 08/22/2009   Qualifier: Diagnosis of  By: [place] MD, Sarah    . 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 Class 3 obesity without serious comorbidity with body mass index (BMI) of 50.0 to 59.9 in adult   . DEPRESSION, SEVERE 08/09/2009   Qualifier: Diagnosis of  By: [place] MD, Sarah    . ECZEMA, ATOPIC 08/30/2009   Qualifier: Diagnosis of  By: [place] MD, Sarah    . GERD (gastroesophageal reflux disease)    Mild  . GRIEF REACTION, ACUTE 07/04/2009   Qualifier: Diagnosis of  By: [place] MD, Sarah    . Heart murmur   . Hereditary and idiopathic peripheral neuropathy 07/23/2016   01/12/2019 not currently having problems  . History of hiatal hernia    Small  . Iron deficiency  anemia 07/2016  . KNEE PAIN, RIGHT 11/01/2008   Qualifier: Diagnosis of  By: Darrick Penna MD, KARL    . Lower extremity edema   . MUSCLE CRAMPS 07/04/2009   Qualifier: Diagnosis of  By: [place] MD, Sarah    . OSA (obstructive sleep apnea)    recent diagnosis, no CPAP received at this time   . Pallor 07/17/2009   Qualifier: Diagnosis of  By: [place] MD, Sarah    . Paresthesia 09/04/2016  . Plantar fasciitis, left 09/10/2011  . PONV (postoperative nausea and vomiting)    nausea only  . 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 PRURITUS, EARS 07/06/2008   Qualifier: Diagnosis of  By: Clelia Croft MD, Kimberlee    . Severe obesity (BMI >= 40) (HCC) 07/06/2008   Qualifier: Diagnosis of  By: Clelia Croft MD, Kimberlee    . SUBACROMIAL BURSITIS, RIGHT 07/04/2009   Qualifier: Diagnosis of  By: Rexene Alberts  MD, Aurther Loft      Patient Active Problem List   Diagnosis Date Noted  . Morbid obesity with BMI of 50.0-59.9, adult (HCC) 02/08/2019  . OSA (obstructive sleep apnea) 01/04/2019  . Chronic venous insufficiency 08/27/2018  . Macromastia 05/15/2018  . Chronic upper back pain 05/15/2018  . Iron deficiency anemia   . Morbid obesity (HCC)   . 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 ECZEMA, ATOPIC 08/30/2009  .  Muscle cramping 07/04/2009  . ALLERGIC RHINITIS 07/06/2008    Past Surgical History:  Procedure Laterality Date  . CESAREAN SECTION     X 2  . KNEE ARTHROSCOPY Right 05/05/2015   Procedure: ARTHROSCOPY KNEE CHONDROPLASTY;  Surgeon: Teryl Lucy, MD;  Location: [facility] OR;  Service: Orthopedics;  Laterality: Right;  . LAPAROSCOPIC ABDOMINAL EXPLORATION N/A 01/18/2019   Procedure: LAPAROSCOPIC EXPLORATION;  Surgeon: Ovidio Kin, MD;  Location: [facility] ORS;  Service: General;  Laterality: N/A;  . LAPAROSCOPIC GASTRIC SLEEVE RESECTION N/A 02/08/2019   Procedure: LAPAROSCOPIC GASTRIC SLEEVE RESECTION, Uper Endo, Eras Pathway;  Surgeon: Ovidio Kin, MD;  Location: [facility] ORS;  Service:  General;  Laterality: N/A;  . TUBAL LIGATION  09/16/2003     OB History    Gravida  4   Para  3   Term  3   Preterm      AB  1   Living  3     SAB      TAB  1   Ectopic      Multiple      Live Births               Home Medications    Prior to Admission medications   Medication Sig Start Date End Date Taking? Authorizing Provider  acetaminophen (TYLENOL) 500 MG tablet Take 1,000 mg by mouth every 6 (six) hours as needed for moderate pain or headache.   Yes [provider]  furosemide (LASIX) 20 MG tablet Take 1 tablet (20 mg total) by mouth daily. Patient taking differently: Take 20 mg by mouth 2 (two) times daily.  08/27/18  Yes Hensel, Santiago Bumpers, MD  ondansetron (ZOFRAN-ODT) 4 MG disintegrating tablet Take 4 mg by mouth every 8 (eight) hours as needed for nausea/vomiting. 01/12/19  Yes [provider]  oxyCODONE (ROXICODONE) 5 MG/5ML solution Take 5 mLs (5 mg total) by mouth every 6 (six) hours as needed for moderate pain or severe pain. 02/10/19  Yes Ovidio Kin, MD  simethicone (MYLICON) 80 MG chewable tablet Chew 80 mg by mouth every 6 (six) hours as needed for flatulence.   Yes [provider]  pantoprazole (PROTONIX) 40 MG tablet Take 40 mg by mouth daily. 01/12/19   [provider]    Family History Family History  Problem Relation Age of Onset  . Cancer Paternal Grandmother   . Hyperlipidemia Paternal Grandmother   . Asthma Mother   . Glomerulonephritis Mother 73       Basement membrane glomerulonephritis resulting ESRD/dialysis  . Cancer Father   . Diabetes Father   . Hypertension Father   . Kidney disease Father   . Kidney disease Maternal Grandmother   . Deep vein thrombosis Sister   . Endometriosis Sister   . Depression Sister     Social History Social History   Tobacco Use  . Smoking status: Never Smoker  . Smokeless tobacco: Never Used  Substance Use Topics  . Alcohol use: Never    Frequency:  Never  . Drug use: No     Allergies   Pork-derived products   Review of Systems Review of Systems  All other systems negative except as documented in the HPI. All pertinent positives and negatives as reviewed in the HPI. Physical Exam Updated Vital Signs BP 139/83   Pulse 85   Temp 98.7 F (37.1 C) (Oral)   Resp 20   LMP 02/11/2019   SpO2 100%   Physical Exam Vitals signs and nursing  note reviewed.  Constitutional:      General: She is not in acute distress.    Appearance: She is well-developed.  HENT:     Head: Normocephalic and atraumatic.  Eyes:     Pupils: Pupils are equal, round, and reactive to light.  Neck:     Musculoskeletal: Normal range of motion and neck supple.  Cardiovascular:     Rate and Rhythm: Normal rate and regular rhythm.     Heart sounds: Normal heart sounds. No murmur. No friction rub. No gallop.   Pulmonary:     Effort: Pulmonary effort is normal. No accessory muscle usage or respiratory distress.     Breath sounds: Normal breath sounds. No stridor. No decreased breath sounds, wheezing, rhonchi or rales.  Chest:     Chest wall: No tenderness.  Abdominal:     General: Bowel sounds are normal. There is no distension.     Palpations: Abdomen is soft.     Tenderness: There is no abdominal tenderness.  Skin:    General: Skin is warm and dry.     Capillary Refill: Capillary refill takes less than 2 seconds.     Findings: No erythema or rash.  Neurological:     Mental Status: She is alert and oriented to person, place, and time.     Motor: No abnormal muscle tone.     Coordination: Coordination normal.  Psychiatric:        Behavior: Behavior normal.      ED Treatments / Results  Labs (all labs ordered are listed, but only abnormal results are displayed) Labs Reviewed  CBC - Abnormal; Notable for the following components:      Result Value   RBC 5.64 (*)    MCV 69.5 (*)    MCH 23.0 (*)    RDW 16.6 (*)    All other components within  normal limits  BASIC METABOLIC PANEL  I-STAT BETA HCG BLOOD, ED ([facility], [facility], [facility] ONLY)  TROPONIN I (HIGH SENSITIVITY)  TROPONIN I (HIGH SENSITIVITY)    EKG EKG Interpretation  Date/Time:  Thursday February 11 2019 13:51:12 EST Ventricular Rate:  102 PR Interval:    QRS Duration: 85 QT Interval:  316 QTC Calculation: 412 R Axis:   44 Text Interpretation: Sinus tachycardia Right atrial enlargement Low voltage, precordial leads Since last tracing rate faster Otherwise no significant change Confirmed by Daleen Bo [phone]) on 02/11/2019 3:50:35 PM   Radiology Ct Angio Chest Pe W/cm &/or Wo Cm  Result Date: 02/11/2019 CLINICAL DATA:  Patient with chest pain. Recent gastric sleeve surgery. Evaluate for pulmonary embolus. EXAM: CT ANGIOGRAPHY CHEST WITH CONTRAST TECHNIQUE: Multidetector CT imaging of the chest was performed using the standard protocol during bolus administration of intravenous contrast. Multiplanar CT image reconstructions and MIPs were obtained to evaluate the vascular anatomy. CONTRAST:  169mL OMNIPAQUE IOHEXOL 350 MG/ML SOLN COMPARISON:  None. FINDINGS: Cardiovascular: Normal cardiac and mediastinal contours. Aorta and main pulmonary artery are normal in caliber. Motion artifact limits evaluation. Adequate opacification of the pulmonary arterial system. No evidence for central, main or lobar pulmonary embolus. Evaluation of the segmental and subsegmental pulmonary arteries is nondiagnostic due to motion artifact. Mediastinum/Nodes: No enlarged axillary, mediastinal or hilar lymphadenopathy. Lungs/Pleura: Central airways are patent. Dependent atelectasis within the left lower lobe. No large area of pulmonary consolidation. No pleural effusion or pneumothorax. Upper Abdomen: Indeterminate 2.9 cm exophytic lesion interpolar region left kidney within internal density of 35 Hounsfield units. No acute process. Musculoskeletal: No  aggressive or acute appearing osseous lesions. Review of  the MIP images confirms the above findings. IMPRESSION: Limited exam due to motion artifact. No evidence for central, main or lobar pulmonary embolus. Evaluation of the more distal pulmonary arteries including segmental and subsegmental arteries is nondiagnostic due to motion artifact. Indeterminate exophytic lesion mid pole left kidney. In the nonacute setting, recommend dedicated evaluation pre and post contrast-enhanced CT or. Electronically Signed   By: Annia Beltrew  Davis M.D.   On: 02/11/2019 19:45   Dg Chest Port 1 View  Result Date: 02/11/2019 CLINICAL DATA:  Chest pain for 1 day. Gastric sleeve surgery 3 days ago. EXAM: PORTABLE CHEST 1 VIEW COMPARISON:  Chest radiograph dated 01/12/2019 FINDINGS: The heart size and mediastinal contours are within normal limits. Both lungs are clear. The visualized skeletal structures are unremarkable. IMPRESSION: No active disease. Electronically Signed   By: Romona Curlsyler  Litton M.D.   On: 02/11/2019 17:32    Procedures Procedures (including critical care time)  Medications Ordered in ED Medications  sodium chloride flush (NS) 0.9 % injection 3 mL (has no administration in time range)  morphine 4 MG/ML injection 4 mg (4 mg Intravenous Refused 02/11/19 2101)  iohexol (OMNIPAQUE) 350 MG/ML injection 100 mL (100 mLs Intravenous Contrast Given 02/11/19 1935)  ketorolac (TORADOL) 30 MG/ML injection 30 mg (30 mg Intravenous Given 02/11/19 2047)     Initial Impression / Assessment and Plan / ED Course  I have reviewed the triage vital signs and the nursing notes.  Pertinent labs & imaging results that were available during my care of the patient were reviewed by me and considered in my medical decision making (see chart for details).        She had CT angio of the chest which did not show any significant abnormalities at this time.  The patient's laboratory testing did not yield any significant abnormalities.  EKG did not show any acute abnormalities or changes.   Patient had 2 sets of negative high-sensitivity troponins.  At this point I feel that the patient has nonspecific chest wall pain.  Have advised the patient to return here as needed.  Told to follow-up with her primary care doctor. Final Clinical Impressions(s) / ED Diagnoses   Final diagnoses:  Chest pain    ED Discharge Orders    None       Charlestine NightLawyer, Tamarick Kovalcik, Cordelia Poche-C 02/12/19 0017    Lorre NickAllen, Anthony, MD 02/15/19 1313

## 2019-02-11 NOTE — Discharge Instructions (Addendum)
Return here as needed.  Follow-up with your primary doctor. °

## 2019-02-12 ENCOUNTER — Other Ambulatory Visit: Payer: Self-pay

## 2019-02-12 DIAGNOSIS — Z20822 Contact with and (suspected) exposure to covid-19: Secondary | ICD-10-CM

## 2019-02-15 ENCOUNTER — Telehealth (HOSPITAL_COMMUNITY): Payer: Self-pay

## 2019-02-15 LAB — NOVEL CORONAVIRUS, NAA: SARS-CoV-2, NAA: DETECTED — AB

## 2019-02-15 NOTE — Telephone Encounter (Signed)
Patient called to discuss post bariatric surgery follow up questions.  See below:   1.  Tell me about your pain and pain management?achy joints has not needed pain medication except tylenol  2.  Let's talk about fluid intake.  How much total fluid are you taking in?45-50  3.  How much protein have you taken in the last 2 days?45-60 grams of protein  4.  Have you had nausea?  Tell me about when have experienced nausea and what you did to help?denies  5.  Has the frequency or color changed with your urine?urine clear and light  6.  Tell me what your incisions look like?no problems  7.  Have you been passing gas? BM?had bm  8.  If a problem or question were to arise who would you call?  Do you know contact numbers for [facility], [facility], and [facility]?aware of how to contact all services  9.  How has the walking going?walking around of your house  10.  How are your vitamins and calcium going?  How are you taking them?mvi and calcium no problem   Tested for positive of covid

## 2019-02-24 ENCOUNTER — Encounter: Payer: BC Managed Care – PPO | Attending: Surgery | Admitting: Dietician

## 2019-02-24 ENCOUNTER — Encounter: Payer: Self-pay | Admitting: Dietician

## 2019-02-24 DIAGNOSIS — E669 Obesity, unspecified: Secondary | ICD-10-CM | POA: Diagnosis not present

## 2019-02-24 NOTE — Progress Notes (Signed)
2 Week Post-Operative Nutrition Education Bariatric Nutrition Education  Start Time: 9:00am   End Time: 9:25am  Patient was seen via [facility] on 02/24/2019 for post-operative nutrition education by [facility]   Surgery date: 02/08/2019 Surgery type: Sleeve  Start weight at [facility]: 345.6 lbs (date: 11/26/2018) Weight today: N/A (virtual visit)  Patient was seen via MyChart due to recent diagnosis of COVID-19. Patient states her fluid and protein intake have been low and she has had very little energy, however everything is improving daily. Tolerates Fairlife milk very well and likes Protein2O drinks as her protein sources. States she drinks 32 oz of Gatorade Zero per day. Walks around her house throughout the day. Other than dry scratchy throat, no other issues reported at this time.   The following the learning objectives were met by the patient during this course:  Identifies Phase 3 (Soft, High Protein Foods) Dietary Goals and will begin from 2 weeks post-operatively to 2 months post-operatively  Identifies appropriate sources of fluids and proteins   States protein recommendations and appropriate sources post-operatively  Identifies the need for appropriate texture modifications, mastication, and bite sizes when consuming solids  Identifies appropriate multivitamin and calcium sources post-operatively  Describes the need for physical activity post-operatively and will follow MD recommendations  States when to call healthcare provider regarding medication questions or post-operative complications  Handouts given include:  Phase 3: High Protein  Phase 3 Meal Ideas  (Provided via email at [email or web address])   Follow-Up Plan: Patient will follow-up at [facility] in 6 weeks for 2 month post-op nutrition visit for diet advancement per MD.

## 2019-02-26 ENCOUNTER — Other Ambulatory Visit: Payer: Self-pay

## 2019-02-26 DIAGNOSIS — Z20822 Contact with and (suspected) exposure to covid-19: Secondary | ICD-10-CM

## 2019-02-28 LAB — NOVEL CORONAVIRUS, NAA: SARS-CoV-2, NAA: NOT DETECTED

## 2019-03-09 ENCOUNTER — Other Ambulatory Visit: Payer: Self-pay | Admitting: Family Medicine

## 2019-03-09 DIAGNOSIS — R609 Edema, unspecified: Secondary | ICD-10-CM

## 2019-03-11 ENCOUNTER — Other Ambulatory Visit: Payer: Self-pay

## 2019-03-12 MED ORDER — PANTOPRAZOLE SODIUM 40 MG PO TBEC: 40.0000 mg | DELAYED_RELEASE_TABLET | Freq: Every day | ORAL | 3 refills | Status: DC

## 2019-04-06 ENCOUNTER — Encounter: Payer: BC Managed Care – PPO | Attending: Surgery | Admitting: Dietician

## 2019-04-06 DIAGNOSIS — Z9884 Bariatric surgery status: Secondary | ICD-10-CM

## 2019-04-06 DIAGNOSIS — E669 Obesity, unspecified: Secondary | ICD-10-CM | POA: Diagnosis not present

## 2019-04-06 NOTE — Progress Notes (Signed)
Bariatric Nutrition Follow-Up Visit Medical Nutrition Therapy  Appt Start Time: 3:45pm   End Time: 4:15pm  MyChart Visit  2 Months Post-Operative Sleeve Gastrectomy Surgery Surgery Date: 02/08/2019  Pt's Expectations of Surgery/ Goals: none stated    NUTRITION ASSESSMENT  Anthropometrics  Start weight at [facility]: 345.6 lbs (date: 11/26/2018) Today's weight: N/A    Lifestyle & Dietary Hx Patient states she has had no major issues with tolerating foods/fluids. Foods eaten include protein sources. Typical meal pattern is 3 meals plus 2 snacks per day. All beverages are bariatric appropriate.    24-Hr Dietary Recall First Meal: egg Snack: Triple Zero [place] yogurt   Second Meal: [place] deli meat + cheese + fat free mayo  Snack: beans   Third Meal: grilled tilapia  Snack: - Beverages: water, Ocean Spray diet juices, sugar-free flavored water   Estimated daily fluid intake: ~64 oz Estimated daily protein intake: ~60 g Supplements: bariatric MVI, calcium  Current average weekly physical activity: on feet at work, walking, regular light exercise   Post-Op Goals/ Signs/ Symptoms Using straws: no Drinking while eating: no Chewing/swallowing difficulties: no Changes in vision: no Changes to mood/headaches: no Hair loss/changes to skin/nails: no Difficulty focusing/concentrating: no Sweating: no Dizziness/lightheadedness: no Palpitations: no  Carbonated/caffeinated beverages: no N/V/D/C/Gas: no Abdominal pain: no Dumping syndrome: no   NUTRITION DIAGNOSIS  Overweight/obesity (-3.3) related to past poor dietary habits and physical inactivity as evidenced by completed bariatric surgery and following dietary guidelines for continued weight loss and healthy nutrition status.   NUTRITION INTERVENTION Nutrition counseling (C-1) and education (E-2) to facilitate bariatric surgery goals, including: . Diet advancement to the next phase (phase 4) now including non-starchy  vegetables . The importance of consuming adequate calories as well as certain nutrients daily due to the body's need for essential vitamins, minerals, and fats . The importance of daily physical activity and to reach a goal of at least 150 minutes of moderate to vigorous physical activity weekly (or as directed by their physician) due to benefits such as increased musculature and improved lab values  Handouts Provided Include   Phase 4: Protein + Non-Starchy Vegetables (provided via email at [email or web address])  Learning Style & Readiness for Change Teaching method utilized: Visual & Auditory  Demonstrated degree of understanding via: Teach Back  Barriers to learning/adherence to lifestyle change: None Identified   MONITORING & EVALUATION Dietary intake, weekly physical activity, body weight, and goals in 4 months.  Next Steps Patient is to follow-up in 4 months for 6 month post-op follow-up.

## 2019-04-07 ENCOUNTER — Encounter: Payer: Self-pay | Admitting: Dietician

## 2019-04-07 NOTE — Patient Instructions (Signed)

## 2019-04-15 ENCOUNTER — Other Ambulatory Visit: Payer: Self-pay | Admitting: Family Medicine

## 2019-04-15 DIAGNOSIS — K649 Unspecified hemorrhoids: Secondary | ICD-10-CM

## 2019-04-15 NOTE — Progress Notes (Signed)
Ef Pt called requesting eval by surgery for her hemoprrhoids

## 2019-05-06 ENCOUNTER — Other Ambulatory Visit: Payer: Self-pay | Admitting: Family Medicine

## 2019-08-10 DIAGNOSIS — Z903 Acquired absence of stomach [part of]: Secondary | ICD-10-CM | POA: Diagnosis not present

## 2019-12-05 ENCOUNTER — Encounter: Payer: Self-pay | Admitting: Emergency Medicine

## 2019-12-05 ENCOUNTER — Other Ambulatory Visit: Payer: Self-pay

## 2019-12-05 ENCOUNTER — Ambulatory Visit
Admission: EM | Admit: 2019-12-05 | Discharge: 2019-12-05 | Disposition: A | Discharge status: Home / Self Care | Payer: BC Managed Care – PPO | Attending: Emergency Medicine | Admitting: Emergency Medicine

## 2019-12-05 DIAGNOSIS — Z20822 Contact with and (suspected) exposure to covid-19: Secondary | ICD-10-CM | POA: Diagnosis not present

## 2019-12-05 DIAGNOSIS — J069 Acute upper respiratory infection, unspecified: Secondary | ICD-10-CM | POA: Insufficient documentation

## 2019-12-05 LAB — GROUP A STREP BY PCR: Group A Strep by PCR: NOT DETECTED

## 2019-12-05 MED ORDER — IBUPROFEN 600 MG PO TABS: 600.0000 mg | ORAL_TABLET | Freq: Four times a day (QID) | ORAL | 0 refills | Status: DC | PRN

## 2019-12-05 MED ORDER — HYDROCOD POLST-CPM POLST ER 10-8 MG/5ML PO SUER
5.0000 mL | Freq: Two times a day (BID) | ORAL | 0 refills | Status: DC | PRN
Start: 2019-12-05 — End: 2021-11-16

## 2019-12-05 MED ORDER — FLUTICASONE PROPIONATE 50 MCG/ACT NA SUSP: 2.0000 | Freq: Every day | NASAL | 0 refills | Status: AC

## 2019-12-05 MED ORDER — BENZONATATE 200 MG PO CAPS: 200.0000 mg | ORAL_CAPSULE | Freq: Three times a day (TID) | ORAL | 0 refills | Status: DC | PRN

## 2019-12-05 NOTE — ED Triage Notes (Signed)
Patient c/o cough, nasal congestion, sore throat, and bodyaches that started on Thursday.  Patient denies fevers.

## 2019-12-05 NOTE — ED Provider Notes (Signed)
HPI  SUBJECTIVE:  Sara [name] is a 44 y.o. female who presents with 3 days of sore throat, extensive greenish-yellowish nasal congestion, rhinorrhea. She reports substernal sharp, stabbing chest pain present only with breathing.  States that she has been doing lots of coughing which is productive of the same material as her nasal congestion.  States that she is unable to sleep at night secondary to the cough.  She reports headaches, sinus pain and pressure, postnasal drip, shortness of breath when coughing.  She reports swollen cervical lymphadenopathy and raspy voice.  She denies fevers, body aches, facial swelling, upper dental pain.  No loss of sense of smell or taste, wheezing, nausea, vomiting, diarrhea, abdominal pain.  No known Covid exposure.  She had Covid in November 20, and has been fully vaccinated.  No allergy or GERD symptoms.  No rash, drooling, trismus, sensation of throat swelling shut, difficulty breathing.  She states that she was indirectly exposed to RSV-her sister has identical symptoms which were thought to be due to RSV.  No known exposure to strep or mono.  Patient has been drinking throat coat tea, and took 2 doses of liquid amoxicillin starting yesterday without improvement in her symptoms.  She also tried 1500 mg of Tylenol once and Benadryl at night.  No alleviating factors.  Symptoms are worse with swallowing, and sore throat is worse in the morning.  Patient denies history of allergies, frequent sinusitis, GERD, frequent strep, diabetes, hypertension, pulmonary disease, smoking.  LMP: Now.  Denies possibility pregnant.  GMW:NUUV, Roxine Caddy, MD     Past Medical History:  Diagnosis Date  . ABDOMINAL PAIN 07/04/2009   Qualifier: Diagnosis of  By: [place] MD, Sarah    . ALLERGIC RHINITIS 07/06/2008   Qualifier: Diagnosis of  By: Clelia Croft MD, Kimberlee    . Arthritis   . Bilateral leg numbness 07/2016  . Bulimia nervosa 08/22/2009   Qualifier: Diagnosis of  By: [place] MD, Sarah     . 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 Class 3 obesity without serious comorbidity with body mass index (BMI) of 50.0 to 59.9 in adult   . DEPRESSION, SEVERE 08/09/2009   Qualifier: Diagnosis of  By: [place] MD, Sarah    . ECZEMA, ATOPIC 08/30/2009   Qualifier: Diagnosis of  By: [place] MD, Sarah    . GERD (gastroesophageal reflux disease)    Mild  . GRIEF REACTION, ACUTE 07/04/2009   Qualifier: Diagnosis of  By: [place] MD, Sarah    . Heart murmur   . Hereditary and idiopathic peripheral neuropathy 07/23/2016   01/12/2019 not currently having problems  . History of hiatal hernia    Small  . Iron deficiency anemia 07/2016  . KNEE PAIN, RIGHT 11/01/2008   Qualifier: Diagnosis of  By: Darrick Penna MD, KARL    . Lower extremity edema   . MUSCLE CRAMPS 07/04/2009   Qualifier: Diagnosis of  By: [place] MD, Sarah    . OSA (obstructive sleep apnea)    recent diagnosis, no CPAP received at this time   . Pallor 07/17/2009   Qualifier: Diagnosis of  By: [place] MD, Sarah    . Paresthesia 09/04/2016  . Plantar fasciitis, left 09/10/2011  . PONV (postoperative nausea and vomiting)    nausea only  . 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 PRURITUS, EARS 07/06/2008   Qualifier: Diagnosis of  By: Clelia Croft MD, Kimberlee    . Severe obesity (BMI >= 40) (  HCC) 07/06/2008   Qualifier: Diagnosis of  By: Clelia Croft MD, Kimberlee    . SUBACROMIAL BURSITIS, RIGHT 07/04/2009   Qualifier: Diagnosis of  By: Rexene Alberts  MD, Aurther Loft      Past Surgical History:  Procedure Laterality Date  . CESAREAN SECTION     X 2  . KNEE ARTHROSCOPY Right 05/05/2015   Procedure: ARTHROSCOPY KNEE CHONDROPLASTY;  Surgeon: Teryl Lucy, MD;  Location: [facility] OR;  Service: Orthopedics;  Laterality: Right;  . LAPAROSCOPIC ABDOMINAL EXPLORATION N/A 01/18/2019   Procedure: LAPAROSCOPIC EXPLORATION;  Surgeon: Ovidio Kin, MD;  Location: [facility] ORS;  Service: General;  Laterality: N/A;  . LAPAROSCOPIC GASTRIC SLEEVE  RESECTION N/A 02/08/2019   Procedure: LAPAROSCOPIC GASTRIC SLEEVE RESECTION, Uper Endo, Eras Pathway;  Surgeon: Ovidio Kin, MD;  Location: [facility] ORS;  Service: General;  Laterality: N/A;  . TUBAL LIGATION  09/16/2003    Family History  Problem Relation Age of Onset  . Cancer Paternal Grandmother   . Hyperlipidemia Paternal Grandmother   . Asthma Mother   . Glomerulonephritis Mother 33       Basement membrane glomerulonephritis resulting ESRD/dialysis  . Cancer Father   . Diabetes Father   . Hypertension Father   . Kidney disease Father   . Kidney disease Maternal Grandmother   . Deep vein thrombosis Sister   . Endometriosis Sister   . Depression Sister     Social History   Tobacco Use  . Smoking status: Never Smoker  . Smokeless tobacco: Never Used  Vaping Use  . Vaping Use: Never used  Substance Use Topics  . Alcohol use: Never  . Drug use: No    No current facility-administered medications for this encounter.  Current Outpatient Medications:  .  acetaminophen (TYLENOL) 500 MG tablet, Take 1,000 mg by mouth every 6 (six) hours as needed for moderate pain or headache., Disp: , Rfl:  .  furosemide (LASIX) 20 MG tablet, TAKE 1 TABLET BY MOUTH EVERY DAY, Disp: 30 tablet, Rfl: 3 .  benzonatate (TESSALON) 200 MG capsule, Take 1 capsule (200 mg total) by mouth 3 (three) times daily as needed for cough., Disp: 30 capsule, Rfl: 0 .  chlorpheniramine-HYDROcodone (TUSSIONEX PENNKINETIC ER) 10-8 MG/5ML SUER, Take 5 mLs by mouth every 12 (twelve) hours as needed for cough., Disp: 60 mL, Rfl: 0 .  fluticasone (FLONASE) 50 MCG/ACT nasal spray, Place 2 sprays into both nostrils daily., Disp: 16 g, Rfl: 0 .  ibuprofen (ADVIL) 600 MG tablet, Take 1 tablet (600 mg total) by mouth every 6 (six) hours as needed., Disp: 30 tablet, Rfl: 0 .  pantoprazole (PROTONIX) 40 MG tablet, Take 1 tablet (40 mg total) by mouth daily., Disp: 90 tablet, Rfl: 3 .  simethicone (MYLICON) 80 MG chewable tablet,  Chew 80 mg by mouth every 6 (six) hours as needed for flatulence., Disp: , Rfl:   Allergies  Allergen Reactions  . Pork-Derived Products Other (See Comments)    Religious belief,  Pt agrees (after discussion of heparin containing pork derived product) to receive heparin for surgery 02/08/19     ROS  As noted in HPI.   Physical Exam  BP 111/73 (BP Location: Left Arm)   Pulse 81   Temp 98.4 F (36.9 C) (Oral)   Resp 16   Ht 5\' 7"  (1.702 m)   Wt 104.3 kg   LMP 12/03/2019 (Exact Date)   SpO2 100%   BMI 36.02 kg/m    Constitutional: Well developed, well nourished, no acute distress Eyes:  EOMI, conjunctiva normal bilaterally HENT: Normocephalic, atraumatic,mucus membranes moist.  Extensive rhinorrhea.  Erythematous, swollen turbinates.  No maxillary, frontal sinus tenderness.  Erythematous oropharynx with slightly enlarged tonsils without exudates.  Uvula midline.  Positive cobblestoning, postnasal drip. Neck: Positive anterior cervical lymphadenopathy Respiratory: Normal inspiratory effort, lungs clear bilaterally.  No anterior lateral chest wall tenderness Cardiovascular: Normal rate, regular rhythm no murmurs rubs or gallops GI: nondistended skin: No rash, skin intact Musculoskeletal: no deformities Neurologic: Alert & oriented x 3, no focal neuro deficits Psychiatric: Speech and behavior appropriate   ED Course   Medications - No data to display  Orders Placed This Encounter  Procedures  . Group A Strep by PCR    Standing Status:   Standing    Number of Occurrences:   1  . SARS CORONAVIRUS 2 (TAT 6-24 HRS) Nasopharyngeal Nasopharyngeal Swab    Standing Status:   Standing    Number of Occurrences:   1    Order Specific Question:   Is this test for diagnosis or screening    Answer:   Screening    Order Specific Question:   Symptomatic for COVID-19 as defined by CDC    Answer:   Yes    Order Specific Question:   Date of Symptom Onset    Answer:   12/02/2019     Order Specific Question:   Hospitalized for COVID-19    Answer:   No    Order Specific Question:   Admitted to ICU for COVID-19    Answer:   No    Order Specific Question:   Previously tested for COVID-19    Answer:   Yes    Order Specific Question:   Resident in a congregate (group) care setting    Answer:   No    Order Specific Question:   Employed in healthcare setting    Answer:   No    Order Specific Question:   Pregnant    Answer:   No    Order Specific Question:   Has patient completed COVID vaccination(s) (2 doses of Pfizer/Moderna 1 dose of [employer])    Answer:   Yes    Results for orders placed or performed during the hospital encounter of 12/05/19 (from the past 24 hour(s))  Group A Strep by PCR     Status: None   Collection Time: 12/05/19  4:14 PM   Specimen: Throat; Sterile Swab  Result Value Ref Range   Group A Strep by PCR NOT DETECTED NOT DETECTED   No results found.  ED Clinical Impression  1. Viral upper respiratory tract infection   2. Encounter for laboratory testing for COVID-19 virus      ED Assessment/Plan  [place] [facility] database reviewed for this patient, and feel that the risk/benefit ratio today is favorable for proceeding with a prescription for controlled substance.  No opiate prescriptions since 2020  Strep PCR negative.  Patient with a URI.  Covid test sent.  No indications for antibiotics.  Home with Flonase, saline nasal irrigation, Benadryl/Maalox, Tylenol, Tussionex for the cough at night, Tessalon for the cough during the day, Afrin at night for no more than 3 days.  Discussed labs, MDM, treatment plan, and plan for follow-up with patient. patient agrees with plan.   Meds ordered this encounter  Medications  . fluticasone (FLONASE) 50 MCG/ACT nasal spray    Sig: Place 2 sprays into both nostrils daily.    Dispense:  16 g  Refill:  0  . chlorpheniramine-HYDROcodone (TUSSIONEX PENNKINETIC ER) 10-8 MG/5ML SUER    Sig: Take 5 mLs  by mouth every 12 (twelve) hours as needed for cough.    Dispense:  60 mL    Refill:  0  . ibuprofen (ADVIL) 600 MG tablet    Sig: Take 1 tablet (600 mg total) by mouth every 6 (six) hours as needed.    Dispense:  30 tablet    Refill:  0  . benzonatate (TESSALON) 200 MG capsule    Sig: Take 1 capsule (200 mg total) by mouth 3 (three) times daily as needed for cough.    Dispense:  30 capsule    Refill:  0    *This clinic note was created using Scientist, clinical (histocompatibility and immunogenetics)Dragon dictation software. Therefore, there may be occasional mistakes despite careful proofreading.   ?    Domenick GongMortenson, Caeleb Batalla, MD 12/05/19 1710

## 2019-12-05 NOTE — Discharge Instructions (Addendum)
We will Contact you if your Covid test comes back positive.  You will be a candidate for monoclonal antibody infusion.    Tussionex for the cough at night, Tessalon for the cough during the day in the meantime, Take 1 gram of tylenol up to 3-4 times a day as needed for pain and fever.  Drink extra fluids. Start taking  mucinex-d  to keep the mucus secretions thin. Use a neti pot or the NeilMed sinus rinse as often as you want to to reduce nasal congestion. Follow the directions on the box.  your strep PCR is negative.  This is not strep throat. Some people find salt water gargles and  Traditional Medicinal's "Throat Coat" tea helpful. Take 5 mL of liquid Benadryl and 5 mL of Maalox. Mix it together, and then hold it in your mouth for as long as you can and then swallow. You may do this 4 times a day.    Go to www.goodrx.com to look up your medications. This will give you a list of where you can find your prescriptions at the most affordable prices. Or ask the pharmacist what the cash price is, or if they have any other discount programs available to help make your medication more affordable. This can be less expensive than what you would pay with insurance.

## 2019-12-06 LAB — SARS CORONAVIRUS 2 (TAT 6-24 HRS): SARS Coronavirus 2: NEGATIVE

## 2020-02-23 DIAGNOSIS — Z903 Acquired absence of stomach [part of]: Secondary | ICD-10-CM | POA: Diagnosis not present

## 2020-04-09 IMAGING — DX DG CHEST 1V PORT
1 series · 1 of 1 positions shown · non-contrast
Comparison: Chest radiograph dated 01/12/2019

CLINICAL DATA: Chest pain for 1 day. Gastric sleeve surgery 3 days
ago.

EXAM:
PORTABLE CHEST 1 VIEW

[chest ap]
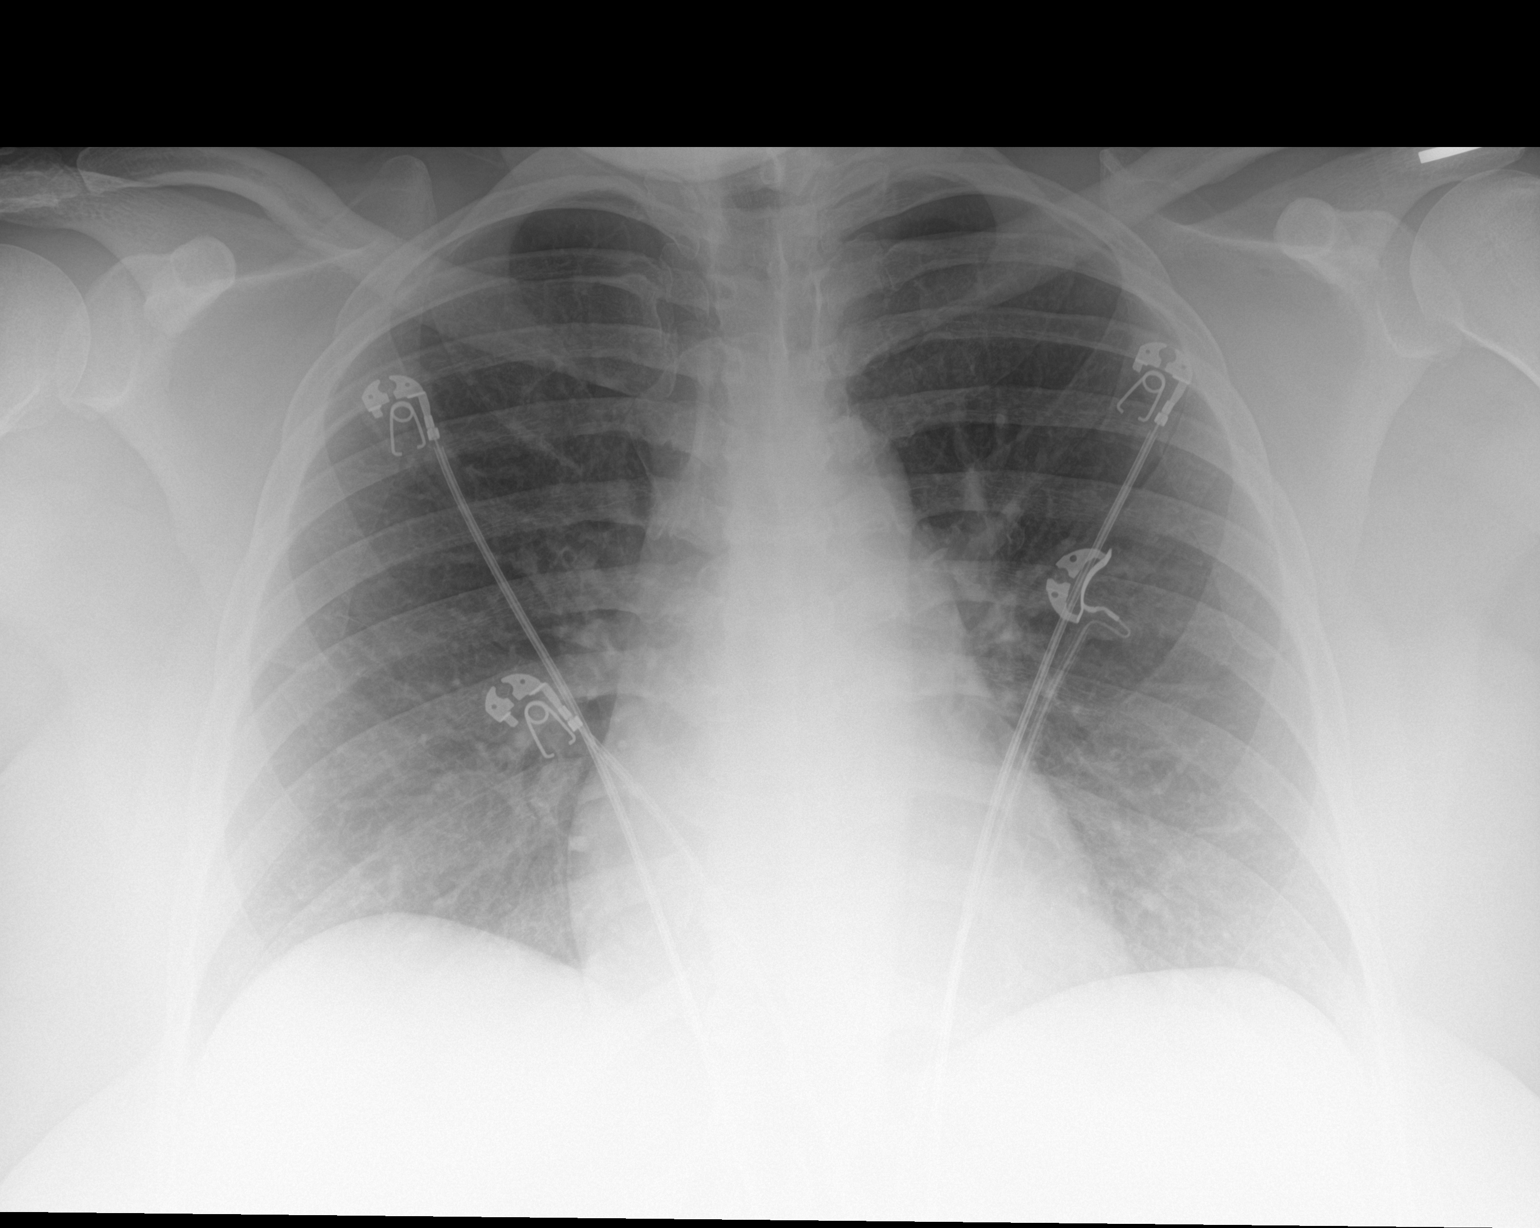

[1 of 1 positions shown; findings below may reference images not displayed]

FINDINGS: The heart size and mediastinal contours are within normal limits.
Both lungs are clear. The visualized skeletal structures are
unremarkable.
IMPRESSION: No active disease.

## 2020-07-06 ENCOUNTER — Ambulatory Visit: Payer: BC Managed Care – PPO

## 2020-07-06 DIAGNOSIS — R5383 Other fatigue: Secondary | ICD-10-CM | POA: Diagnosis not present

## 2020-07-06 DIAGNOSIS — R1084 Generalized abdominal pain: Secondary | ICD-10-CM | POA: Diagnosis not present

## 2020-07-06 DIAGNOSIS — Z131 Encounter for screening for diabetes mellitus: Secondary | ICD-10-CM | POA: Diagnosis not present

## 2020-07-06 DIAGNOSIS — Z79899 Other long term (current) drug therapy: Secondary | ICD-10-CM | POA: Diagnosis not present

## 2020-07-06 DIAGNOSIS — N951 Menopausal and female climacteric states: Secondary | ICD-10-CM | POA: Diagnosis not present

## 2020-07-06 DIAGNOSIS — E559 Vitamin D deficiency, unspecified: Secondary | ICD-10-CM | POA: Diagnosis not present

## 2020-07-06 DIAGNOSIS — M25562 Pain in left knee: Secondary | ICD-10-CM | POA: Diagnosis not present

## 2020-07-06 DIAGNOSIS — Z6838 Body mass index (BMI) 38.0-38.9, adult: Secondary | ICD-10-CM | POA: Diagnosis not present

## 2020-07-06 DIAGNOSIS — N914 Secondary oligomenorrhea: Secondary | ICD-10-CM | POA: Diagnosis not present

## 2020-07-06 DIAGNOSIS — Z13220 Encounter for screening for lipoid disorders: Secondary | ICD-10-CM | POA: Diagnosis not present

## 2020-07-27 DIAGNOSIS — M25562 Pain in left knee: Secondary | ICD-10-CM | POA: Diagnosis not present

## 2020-08-03 DIAGNOSIS — M25562 Pain in left knee: Secondary | ICD-10-CM | POA: Diagnosis not present

## 2020-08-10 DIAGNOSIS — M25562 Pain in left knee: Secondary | ICD-10-CM | POA: Diagnosis not present

## 2020-08-10 DIAGNOSIS — S83242A Other tear of medial meniscus, current injury, left knee, initial encounter: Secondary | ICD-10-CM | POA: Diagnosis not present

## 2020-08-12 DIAGNOSIS — Z20822 Contact with and (suspected) exposure to covid-19: Secondary | ICD-10-CM | POA: Diagnosis not present

## 2020-09-06 DIAGNOSIS — G8918 Other acute postprocedural pain: Secondary | ICD-10-CM | POA: Diagnosis not present

## 2020-09-06 DIAGNOSIS — M23222 Derangement of posterior horn of medial meniscus due to old tear or injury, left knee: Secondary | ICD-10-CM | POA: Diagnosis not present

## 2020-09-06 DIAGNOSIS — M6752 Plica syndrome, left knee: Secondary | ICD-10-CM | POA: Diagnosis not present

## 2020-09-06 DIAGNOSIS — M94262 Chondromalacia, left knee: Secondary | ICD-10-CM | POA: Diagnosis not present

## 2020-09-06 DIAGNOSIS — M23232 Derangement of other medial meniscus due to old tear or injury, left knee: Secondary | ICD-10-CM | POA: Diagnosis not present

## 2020-09-10 ENCOUNTER — Other Ambulatory Visit: Payer: Self-pay | Admitting: Physician Assistant

## 2020-09-10 ENCOUNTER — Other Ambulatory Visit (HOSPITAL_COMMUNITY): Payer: Self-pay | Admitting: Physician Assistant

## 2020-09-10 ENCOUNTER — Ambulatory Visit (HOSPITAL_COMMUNITY)
Admission: RE | Admit: 2020-09-10 | Discharge: 2020-09-10 | Disposition: A | Discharge status: Home / Self Care | Payer: BC Managed Care – PPO | Source: Ambulatory Visit | Attending: Physician Assistant | Admitting: Physician Assistant

## 2020-09-10 DIAGNOSIS — R52 Pain, unspecified: Secondary | ICD-10-CM | POA: Insufficient documentation

## 2020-09-10 DIAGNOSIS — M25562 Pain in left knee: Secondary | ICD-10-CM | POA: Diagnosis not present

## 2020-09-10 NOTE — Progress Notes (Signed)
VASCULAR LAB    Left lower extremity venous duplex has been performed.  See CV proc for preliminary results.  Called report to Devoria Glassing, PA-C  Haelee Bolen, RVT 09/10/2020, 1:24 PM

## 2020-09-12 DIAGNOSIS — M23232 Derangement of other medial meniscus due to old tear or injury, left knee: Secondary | ICD-10-CM | POA: Diagnosis not present

## 2020-09-12 DIAGNOSIS — M94262 Chondromalacia, left knee: Secondary | ICD-10-CM | POA: Diagnosis not present

## 2020-09-20 ENCOUNTER — Other Ambulatory Visit: Payer: Self-pay

## 2020-09-20 ENCOUNTER — Encounter (HOSPITAL_BASED_OUTPATIENT_CLINIC_OR_DEPARTMENT_OTHER): Payer: Self-pay

## 2020-09-20 ENCOUNTER — Emergency Department (HOSPITAL_BASED_OUTPATIENT_CLINIC_OR_DEPARTMENT_OTHER)
Admission: EM | Admit: 2020-09-20 | Discharge: 2020-09-20 | Disposition: A | Discharge status: Home / Self Care | Payer: BC Managed Care – PPO | Attending: Emergency Medicine | Admitting: Emergency Medicine

## 2020-09-20 DIAGNOSIS — J3489 Other specified disorders of nose and nasal sinuses: Secondary | ICD-10-CM | POA: Diagnosis not present

## 2020-09-20 DIAGNOSIS — Z20822 Contact with and (suspected) exposure to covid-19: Secondary | ICD-10-CM | POA: Insufficient documentation

## 2020-09-20 DIAGNOSIS — R519 Headache, unspecified: Secondary | ICD-10-CM | POA: Insufficient documentation

## 2020-09-20 LAB — SARS CORONAVIRUS 2 (TAT 6-24 HRS): SARS Coronavirus 2: NEGATIVE

## 2020-09-20 NOTE — Discharge Instructions (Addendum)
Please self isolate awaiting your COVID test results. We will call you if you test positive. You can log into MyChart and check the results that way as well.   I would recommend Ibuprofen and Tylenol as needed for symptom treatment. Drink plenty of fluids to stay hydrated.   Follow up with your PCP regarding ED visit today.   Return to the ED for any new/worsening symptoms

## 2020-09-20 NOTE — ED Provider Notes (Signed)
[facility] EMERGENCY DEPARTMENT Provider Note   CSN: [id number] Arrival date & time: 09/20/20  1542     History Chief Complaint  Patient presents with   Headache    Sara [name] is a 45 y.o. female who presents to the ED today with complaint of gradual onset, constant, achy, diffuse headache that began yesterday. Pt also complains of rhinorrhea. She did not think much of it however her sister texted her today letting her know she had tested positive for COVID. Pt last saw her sister over the weekend and wanted to come and make sure her headache and runny nose were not related to COVID. She denies any fevers, chills, vision changes, nausea, vomiting, abdominal pain, diarrhea, chest pain, SOB, or any other associated symptoms. She has not taken anything for her symptoms. She is vaccinated x 2, no booster.   The history is provided by the patient and medical records.      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   Class 3 obesity without serious comorbidity with body mass index (BMI) of 50.0 to 59.9 in adult    DEPRESSION, SEVERE 08/09/2009   Qualifier: Diagnosis of  By: [place] MD, Agustina Caroli, ATOPIC 08/30/2009   Qualifier: Diagnosis of  By: [place] MD, Sarah     GERD (gastroesophageal reflux disease)    Mild   GRIEF REACTION, ACUTE 07/04/2009   Qualifier: Diagnosis of  By: [place] MD, Sarah     Heart murmur    Hereditary and idiopathic peripheral neuropathy 07/23/2016   01/12/2019 not currently having problems   History of hiatal hernia    Small   Iron deficiency anemia 07/2016   KNEE PAIN, RIGHT 11/01/2008   Qualifier: Diagnosis of  By: Darrick Penna  MD, KARL     Lower extremity edema    MUSCLE CRAMPS 07/04/2009   Qualifier: Diagnosis of  By: [place] MD, Sarah     OSA (obstructive sleep apnea)    recent diagnosis, no CPAP received at this time    Pallor 07/17/2009   Qualifier: Diagnosis of  By: [place] MD, Sarah     Paresthesia 09/04/2016   Plantar fasciitis, left 09/10/2011   PONV (postoperative nausea and vomiting)    nausea only   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   We'll start her on capsaicin cream topically    PRURITUS, EARS 07/06/2008   Qualifier: Diagnosis of  By: Clelia Croft MD, Kimberlee     Severe obesity (BMI >= 40) (HCC) 07/06/2008   Qualifier: Diagnosis of  By: Clelia Croft MD, Kimberlee     SUBACROMIAL BURSITIS, RIGHT 07/04/2009   Qualifier: Diagnosis of  By: Rexene Alberts  MD, Terry      Patient Active Problem List   Diagnosis Date Noted   Morbid obesity with BMI of 50.0-59.9, adult (HCC) 02/08/2019   OSA (obstructive sleep apnea) 01/04/2019   Chronic venous insufficiency 08/27/2018   Macromastia 05/15/2018   Chronic upper back pain 05/15/2018   Iron deficiency anemia    Morbid obesity (HCC)    Chondromalacia, patella 05/05/2015   ECZEMA, ATOPIC 08/30/2009   Muscle cramping  07/04/2009   ALLERGIC RHINITIS 07/06/2008    Past Surgical History:  Procedure Laterality Date   CESAREAN SECTION     X 2   KNEE ARTHROSCOPY Right 05/05/2015   Procedure: ARTHROSCOPY KNEE CHONDROPLASTY;  Surgeon: Teryl Lucy, MD;  Location: [facility] OR;  Service: Orthopedics;  Laterality: Right;   LAPAROSCOPIC ABDOMINAL EXPLORATION N/A 01/18/2019   Procedure: LAPAROSCOPIC EXPLORATION;  Surgeon: Ovidio Kin, MD;  Location: [facility] ORS;  Service: General;  Laterality: N/A;   LAPAROSCOPIC GASTRIC SLEEVE RESECTION N/A 02/08/2019   Procedure: LAPAROSCOPIC GASTRIC SLEEVE RESECTION, Uper Endo, Eras Pathway;  Surgeon: Ovidio Kin, MD;  Location: [facility] ORS;  Service: General;  Laterality: N/A;   TUBAL LIGATION  09/16/2003     OB History     Gravida  4   Para  3   Term  3    Preterm      AB  1   Living  3      SAB      IAB  1   Ectopic      Multiple      Live Births              Family History  Problem Relation Age of Onset   Cancer Paternal Grandmother    Hyperlipidemia Paternal Grandmother    Asthma Mother    Glomerulonephritis Mother 18       Basement membrane glomerulonephritis resulting ESRD/dialysis   Cancer Father    Diabetes Father    Hypertension Father    Kidney disease Father    Kidney disease Maternal Grandmother    Deep vein thrombosis Sister    Endometriosis Sister    Depression Sister     Social History   Tobacco Use   Smoking status: Never   Smokeless tobacco: Never  Vaping Use   Vaping Use: Never used  Substance Use Topics   Alcohol use: Never   Drug use: No    Home Medications Prior to Admission medications   Medication Sig Start Date End Date Taking? Authorizing Provider  acetaminophen (TYLENOL) 500 MG tablet Take 1,000 mg by mouth every 6 (six) hours as needed for moderate pain or headache.    [provider]  benzonatate (TESSALON) 200 MG capsule Take 1 capsule (200 mg total) by mouth 3 (three) times daily as needed for cough. 12/05/19   Domenick Gong, MD  chlorpheniramine-HYDROcodone [facility] PENNKINETIC ER) 10-8 MG/5ML SUER Take 5 mLs by mouth every 12 (twelve) hours as needed for cough. 12/05/19   Domenick Gong, MD  fluticasone (FLONASE) 50 MCG/ACT nasal spray Place 2 sprays into both nostrils daily. 12/05/19   Domenick Gong, MD  furosemide (LASIX) 20 MG tablet TAKE 1 TABLET BY MOUTH EVERY DAY 03/09/19   Nestor Ramp, MD  ibuprofen (ADVIL) 600 MG tablet Take 1 tablet (600 mg total) by mouth every 6 (six) hours as needed. 12/05/19   Domenick Gong, MD  pantoprazole (PROTONIX) 40 MG tablet Take 1 tablet (40 mg total) by mouth daily. 03/12/19   Nestor Ramp, MD  simethicone (MYLICON) 80 MG chewable tablet Chew 80 mg by mouth every 6 (six) hours as needed for flatulence.    [provider]    Allergies    Pork-derived products  Review of Systems   Review of Systems  Constitutional:  Negative for chills and fever.  HENT:  Positive for rhinorrhea. Negative for congestion.   Respiratory:  Negative for cough and shortness of breath.   Neurological:  Positive  for headaches.  All other systems reviewed and are negative.  Physical Exam Updated Vital Signs BP 126/67 (BP Location: Left Arm)   Pulse 95   Temp 98.4 F (36.9 C) (Oral)   Resp 20   Ht 5\' 7"  (1.702 m)   Wt 112 kg   LMP 09/19/2020   SpO2 100%   BMI 38.69 kg/m   Physical Exam Vitals and nursing note reviewed.  Constitutional:      Appearance: She is not ill-appearing.  HENT:     Head: Normocephalic and atraumatic.  Eyes:     Conjunctiva/sclera: Conjunctivae normal.  Cardiovascular:     Rate and Rhythm: Normal rate and regular rhythm.  Pulmonary:     Effort: Pulmonary effort is normal.     Breath sounds: Normal breath sounds. No wheezing, rhonchi or rales.  Skin:    General: Skin is warm and dry.     Coloration: Skin is not jaundiced.  Neurological:     Mental Status: She is alert.    ED Results / Procedures / Treatments   Labs (all labs ordered are listed, but only abnormal results are displayed) Labs Reviewed  SARS CORONAVIRUS 2 (TAT 6-24 HRS)    EKG None  Radiology No results found.  Procedures Procedures   Medications Ordered in ED Medications - No data to display  ED Course  I have reviewed the triage vital signs and the nursing notes.  Pertinent labs & imaging results that were available during my care of the patient were reviewed by me and considered in my medical decision making (see chart for details).    MDM Rules/Calculators/A&P                          45 year old female who presents to the ED today with complaint of headache for the past 2 days as well as runny nose.  Recent sick contact with family member who is COVID-positive.  Patient is  vaccinated x2.  She is here for COVID test.  On arrival to the ED vitals are stable.  Patient appears to be no acute distress.  She states her headache is very mild.  She denies any meningeal signs.  No other risk factors for concerning headache at this time.  Will swab for COVID and discharged home with strict return precautions and instructions to self isolate until she receives her results.  Patient is in agreement with plan and stable for discharge.   This note was prepared using Dragon voice recognition software and may include unintentional dictation errors due to the inherent limitations of voice recognition software.  Sara [name] was evaluated in Emergency Department on 09/20/2020 for the symptoms described in the history of present illness. She was evaluated in the context of the global COVID-19 pandemic, which necessitated consideration that the patient might be at risk for infection with the SARS-CoV-2 virus that causes COVID-19. Institutional protocols and algorithms that pertain to the evaluation of patients at risk for COVID-19 are in a state of rapid change based on information released by regulatory bodies including the CDC and federal and state organizations. These policies and algorithms were followed during the patient's care in the ED.   Final Clinical Impression(s) / ED Diagnoses Final diagnoses:  Bad headache  Rhinorrhea    Rx / DC Orders ED Discharge Orders     None        Discharge Instructions      Please self isolate awaiting  your COVID test results. We will call you if you test positive. You can log into MyChart and check the results that way as well.   I would recommend Ibuprofen and Tylenol as needed for symptom treatment. Drink plenty of fluids to stay hydrated.   Follow up with your PCP regarding ED visit today.   Return to the ED for any new/worsening symptoms        Tanda Rockers, Cordelia Poche 09/20/20 1700    Tilden Fossa, MD 09/20/20  [phone]

## 2020-09-20 NOTE — ED Triage Notes (Signed)
Pt c/o HA, URI sx day 2 with +covid exposure-NAD-to triage in w/c

## 2020-09-22 DIAGNOSIS — M94262 Chondromalacia, left knee: Secondary | ICD-10-CM | POA: Diagnosis not present

## 2020-09-22 DIAGNOSIS — M23232 Derangement of other medial meniscus due to old tear or injury, left knee: Secondary | ICD-10-CM | POA: Diagnosis not present

## 2020-09-28 DIAGNOSIS — M23232 Derangement of other medial meniscus due to old tear or injury, left knee: Secondary | ICD-10-CM | POA: Diagnosis not present

## 2020-09-28 DIAGNOSIS — M94262 Chondromalacia, left knee: Secondary | ICD-10-CM | POA: Diagnosis not present

## 2020-10-05 DIAGNOSIS — M94262 Chondromalacia, left knee: Secondary | ICD-10-CM | POA: Diagnosis not present

## 2020-10-05 DIAGNOSIS — Z9889 Other specified postprocedural states: Secondary | ICD-10-CM | POA: Diagnosis not present

## 2020-10-05 DIAGNOSIS — M23232 Derangement of other medial meniscus due to old tear or injury, left knee: Secondary | ICD-10-CM | POA: Diagnosis not present

## 2020-10-19 DIAGNOSIS — M23232 Derangement of other medial meniscus due to old tear or injury, left knee: Secondary | ICD-10-CM | POA: Diagnosis not present

## 2020-10-19 DIAGNOSIS — M94262 Chondromalacia, left knee: Secondary | ICD-10-CM | POA: Diagnosis not present

## 2020-10-26 DIAGNOSIS — M23232 Derangement of other medial meniscus due to old tear or injury, left knee: Secondary | ICD-10-CM | POA: Diagnosis not present

## 2020-10-26 DIAGNOSIS — M94262 Chondromalacia, left knee: Secondary | ICD-10-CM | POA: Diagnosis not present

## 2020-11-02 DIAGNOSIS — M23232 Derangement of other medial meniscus due to old tear or injury, left knee: Secondary | ICD-10-CM | POA: Diagnosis not present

## 2020-11-02 DIAGNOSIS — M94262 Chondromalacia, left knee: Secondary | ICD-10-CM | POA: Diagnosis not present

## 2020-11-07 DIAGNOSIS — M25562 Pain in left knee: Secondary | ICD-10-CM | POA: Diagnosis not present

## 2020-11-07 DIAGNOSIS — M1712 Unilateral primary osteoarthritis, left knee: Secondary | ICD-10-CM | POA: Diagnosis not present

## 2020-11-07 DIAGNOSIS — Z9889 Other specified postprocedural states: Secondary | ICD-10-CM | POA: Diagnosis not present

## 2020-11-09 DIAGNOSIS — M23232 Derangement of other medial meniscus due to old tear or injury, left knee: Secondary | ICD-10-CM | POA: Diagnosis not present

## 2020-11-09 DIAGNOSIS — M94262 Chondromalacia, left knee: Secondary | ICD-10-CM | POA: Diagnosis not present

## 2020-11-14 DIAGNOSIS — M94262 Chondromalacia, left knee: Secondary | ICD-10-CM | POA: Diagnosis not present

## 2020-11-14 DIAGNOSIS — M23232 Derangement of other medial meniscus due to old tear or injury, left knee: Secondary | ICD-10-CM | POA: Diagnosis not present

## 2020-11-20 DIAGNOSIS — M23232 Derangement of other medial meniscus due to old tear or injury, left knee: Secondary | ICD-10-CM | POA: Diagnosis not present

## 2020-11-20 DIAGNOSIS — M94262 Chondromalacia, left knee: Secondary | ICD-10-CM | POA: Diagnosis not present

## 2020-11-23 DIAGNOSIS — M1712 Unilateral primary osteoarthritis, left knee: Secondary | ICD-10-CM | POA: Diagnosis not present

## 2020-11-24 DIAGNOSIS — M23232 Derangement of other medial meniscus due to old tear or injury, left knee: Secondary | ICD-10-CM | POA: Diagnosis not present

## 2020-11-24 DIAGNOSIS — M94262 Chondromalacia, left knee: Secondary | ICD-10-CM | POA: Diagnosis not present

## 2020-11-28 DIAGNOSIS — M23232 Derangement of other medial meniscus due to old tear or injury, left knee: Secondary | ICD-10-CM | POA: Diagnosis not present

## 2020-11-28 DIAGNOSIS — M94262 Chondromalacia, left knee: Secondary | ICD-10-CM | POA: Diagnosis not present

## 2020-11-30 DIAGNOSIS — M1712 Unilateral primary osteoarthritis, left knee: Secondary | ICD-10-CM | POA: Diagnosis not present

## 2020-12-05 DIAGNOSIS — M94262 Chondromalacia, left knee: Secondary | ICD-10-CM | POA: Diagnosis not present

## 2020-12-05 DIAGNOSIS — M23232 Derangement of other medial meniscus due to old tear or injury, left knee: Secondary | ICD-10-CM | POA: Diagnosis not present

## 2020-12-07 DIAGNOSIS — M1712 Unilateral primary osteoarthritis, left knee: Secondary | ICD-10-CM | POA: Diagnosis not present

## 2020-12-12 DIAGNOSIS — M23232 Derangement of other medial meniscus due to old tear or injury, left knee: Secondary | ICD-10-CM | POA: Diagnosis not present

## 2020-12-12 DIAGNOSIS — M94262 Chondromalacia, left knee: Secondary | ICD-10-CM | POA: Diagnosis not present

## 2020-12-19 DIAGNOSIS — M23232 Derangement of other medial meniscus due to old tear or injury, left knee: Secondary | ICD-10-CM | POA: Diagnosis not present

## 2020-12-19 DIAGNOSIS — M94262 Chondromalacia, left knee: Secondary | ICD-10-CM | POA: Diagnosis not present

## 2020-12-28 DIAGNOSIS — R509 Fever, unspecified: Secondary | ICD-10-CM | POA: Diagnosis not present

## 2020-12-28 DIAGNOSIS — U071 COVID-19: Secondary | ICD-10-CM | POA: Diagnosis not present

## 2021-01-04 DIAGNOSIS — M7052 Other bursitis of knee, left knee: Secondary | ICD-10-CM | POA: Diagnosis not present

## 2021-01-04 DIAGNOSIS — M94262 Chondromalacia, left knee: Secondary | ICD-10-CM | POA: Diagnosis not present

## 2021-01-04 DIAGNOSIS — M25562 Pain in left knee: Secondary | ICD-10-CM | POA: Diagnosis not present

## 2021-01-04 DIAGNOSIS — M23232 Derangement of other medial meniscus due to old tear or injury, left knee: Secondary | ICD-10-CM | POA: Diagnosis not present

## 2021-01-09 DIAGNOSIS — M23232 Derangement of other medial meniscus due to old tear or injury, left knee: Secondary | ICD-10-CM | POA: Diagnosis not present

## 2021-01-09 DIAGNOSIS — M94262 Chondromalacia, left knee: Secondary | ICD-10-CM | POA: Diagnosis not present

## 2021-01-11 DIAGNOSIS — M23232 Derangement of other medial meniscus due to old tear or injury, left knee: Secondary | ICD-10-CM | POA: Diagnosis not present

## 2021-01-11 DIAGNOSIS — M94262 Chondromalacia, left knee: Secondary | ICD-10-CM | POA: Diagnosis not present

## 2021-01-18 DIAGNOSIS — M23232 Derangement of other medial meniscus due to old tear or injury, left knee: Secondary | ICD-10-CM | POA: Diagnosis not present

## 2021-01-18 DIAGNOSIS — M94262 Chondromalacia, left knee: Secondary | ICD-10-CM | POA: Diagnosis not present

## 2021-01-23 DIAGNOSIS — M94262 Chondromalacia, left knee: Secondary | ICD-10-CM | POA: Diagnosis not present

## 2021-01-23 DIAGNOSIS — M23232 Derangement of other medial meniscus due to old tear or injury, left knee: Secondary | ICD-10-CM | POA: Diagnosis not present

## 2021-01-24 DIAGNOSIS — M23232 Derangement of other medial meniscus due to old tear or injury, left knee: Secondary | ICD-10-CM | POA: Diagnosis not present

## 2021-01-24 DIAGNOSIS — M94262 Chondromalacia, left knee: Secondary | ICD-10-CM | POA: Diagnosis not present

## 2021-01-25 DIAGNOSIS — M25562 Pain in left knee: Secondary | ICD-10-CM | POA: Diagnosis not present

## 2021-01-30 DIAGNOSIS — M23232 Derangement of other medial meniscus due to old tear or injury, left knee: Secondary | ICD-10-CM | POA: Diagnosis not present

## 2021-01-30 DIAGNOSIS — M94262 Chondromalacia, left knee: Secondary | ICD-10-CM | POA: Diagnosis not present

## 2021-02-01 DIAGNOSIS — M23232 Derangement of other medial meniscus due to old tear or injury, left knee: Secondary | ICD-10-CM | POA: Diagnosis not present

## 2021-02-01 DIAGNOSIS — M94262 Chondromalacia, left knee: Secondary | ICD-10-CM | POA: Diagnosis not present

## 2021-02-08 DIAGNOSIS — M23232 Derangement of other medial meniscus due to old tear or injury, left knee: Secondary | ICD-10-CM | POA: Diagnosis not present

## 2021-02-08 DIAGNOSIS — M94262 Chondromalacia, left knee: Secondary | ICD-10-CM | POA: Diagnosis not present

## 2021-02-13 DIAGNOSIS — M94262 Chondromalacia, left knee: Secondary | ICD-10-CM | POA: Diagnosis not present

## 2021-02-13 DIAGNOSIS — M23232 Derangement of other medial meniscus due to old tear or injury, left knee: Secondary | ICD-10-CM | POA: Diagnosis not present

## 2021-03-07 DIAGNOSIS — M94262 Chondromalacia, left knee: Secondary | ICD-10-CM | POA: Diagnosis not present

## 2021-03-07 DIAGNOSIS — M23232 Derangement of other medial meniscus due to old tear or injury, left knee: Secondary | ICD-10-CM | POA: Diagnosis not present

## 2021-08-10 DIAGNOSIS — Z903 Acquired absence of stomach [part of]: Secondary | ICD-10-CM | POA: Diagnosis not present

## 2021-08-10 DIAGNOSIS — K219 Gastro-esophageal reflux disease without esophagitis: Secondary | ICD-10-CM | POA: Diagnosis not present

## 2021-08-10 DIAGNOSIS — Z8659 Personal history of other mental and behavioral disorders: Secondary | ICD-10-CM | POA: Diagnosis not present

## 2021-08-10 DIAGNOSIS — K449 Diaphragmatic hernia without obstruction or gangrene: Secondary | ICD-10-CM | POA: Diagnosis not present

## 2021-08-16 ENCOUNTER — Other Ambulatory Visit: Payer: Self-pay | Admitting: Surgery

## 2021-08-16 DIAGNOSIS — Z903 Acquired absence of stomach [part of]: Secondary | ICD-10-CM

## 2021-08-28 ENCOUNTER — Encounter: Payer: Self-pay | Admitting: *Deleted

## 2021-09-03 ENCOUNTER — Other Ambulatory Visit: Payer: BC Managed Care – PPO

## 2021-10-10 ENCOUNTER — Ambulatory Visit (INDEPENDENT_AMBULATORY_CARE_PROVIDER_SITE_OTHER): Payer: Self-pay | Admitting: Family Medicine

## 2021-10-10 ENCOUNTER — Encounter: Payer: Self-pay | Admitting: Family Medicine

## 2021-10-10 VITALS — BP 112/84 | HR 88 | Ht 67.0 in | Wt 251.8 lb

## 2021-10-10 DIAGNOSIS — Z6839 Body mass index (BMI) 39.0-39.9, adult: Secondary | ICD-10-CM

## 2021-10-10 DIAGNOSIS — Z0000 Encounter for general adult medical examination without abnormal findings: Secondary | ICD-10-CM

## 2021-10-10 MED ORDER — BUPROPION HCL ER (XL) 150 MG PO TB24: 150.0000 mg | ORAL_TABLET | Freq: Every day | ORAL | 3 refills | Status: DC

## 2021-10-10 NOTE — Progress Notes (Signed)
    CHIEF COMPLAINT / HPI: Discussion about weight.  She has successfully lost almost 100 pounds.  She is status post sleeve procedure.  In the last few weeks she is started to notice a little bit of weight gain and that really frightens her.  She does tend to eat a lot when she is stressed or when she is bored.  She is otherwise feeling well.   PERTINENT  PMH / PSH: I have reviewed the patient's medications, allergies, past medical and surgical history, smoking status and updated in the EMR as appropriate.   OBJECTIVE:  BP 112/84   Pulse 88   Ht 5\' 7"  (1.702 m)   Wt 251 lb 12.8 oz (114.2 kg)   LMP 10/04/2021 (Approximate)   SpO2 96%   BMI 39.44 kg/m  PSYCH: AxOx4. Good eye contact.. No psychomotor retardation or agitation. Appropriate speech fluency and content. Asks and answers questions appropriately. Mood is congruent.   ASSESSMENT / PLAN: #1.  Obesity with significant impressive success having lost 100 pounds in the last 12 to 18 months.  We discussed options at length.  I think she would benefit from Wellbutrin and we will start that.  I will see her back in 1 month.  No problem-specific Assessment & Plan notes found for this encounter.   10/06/2021 MD

## 2021-11-14 ENCOUNTER — Ambulatory Visit (INDEPENDENT_AMBULATORY_CARE_PROVIDER_SITE_OTHER): Payer: BC Managed Care – PPO | Admitting: Family Medicine

## 2021-11-14 ENCOUNTER — Encounter: Payer: Self-pay | Admitting: Family Medicine

## 2021-11-14 VITALS — BP 117/81 | HR 91 | Ht 67.0 in | Wt 249.8 lb

## 2021-11-14 DIAGNOSIS — Z6839 Body mass index (BMI) 39.0-39.9, adult: Secondary | ICD-10-CM | POA: Diagnosis not present

## 2021-11-14 DIAGNOSIS — R609 Edema, unspecified: Secondary | ICD-10-CM | POA: Diagnosis not present

## 2021-11-14 MED ORDER — PHENTERMINE HCL 37.5 MG PO CAPS: 37.5000 mg | ORAL_CAPSULE | ORAL | 1 refills | Status: DC

## 2021-11-14 NOTE — Progress Notes (Signed)
    CHIEF COMPLAINT / HPI: #1.  Follow-up starting Wellbutrin.  She took it for about 2 weeks and did not really notice any good effect on appetite.  Felt perhaps a little jittery so she stopped it.  For her weight loss she has now joined a gym and is going daily.  She would like to try phentermine because she feels she has had a stuck point for her weight. 2. Continued issues with LE swelling PERTINENT  PMH / PSH: I have reviewed the patient's medications, allergies, past medical and surgical history, smoking status and updated in the EMR as appropriate.   OBJECTIVE:  BP 117/81   Pulse 91   Ht 5\' 7"  (1.702 m)   Wt 249 lb 12.8 oz (113.3 kg)   LMP 10/30/2021 (Approximate)   SpO2 99%   BMI 39.12 kg/m  Vital signs reviewed. GENERAL: Well-developed, well-nourished, no acute distress. CARDIOVASCULAR: Regular rate and rhythm no murmur gallop or rub LUNGS: Clear to auscultation bilaterally, no rales or wheeze. ABDOMEN: Soft positive bowel sounds NEURO: No gross focal neurological deficits. MSK: Movement of extremity x 4.   ASSESSMENT / PLAN:   Adult BMI 39.0-39.9 kg/sq m Improvement from highest BMI of 55 to 39 cooncerned about plateau wellbutrin made her feel funny We will try 3 m of phentermine. She knows it is short term tx She has started going to gym 5 days a week and I encouraged hertot conitnue  Edema Intermittent foot and ankle swelling. Improved with weight loss but stil problematic if she stands or sits all day. Compression socks did not help much Will try prn lasix F/u 1 m Check labs then   12-07-1988 MD

## 2021-11-16 DIAGNOSIS — R609 Edema, unspecified: Secondary | ICD-10-CM | POA: Insufficient documentation

## 2021-11-16 NOTE — Assessment & Plan Note (Addendum)
Intermittent foot and ankle swelling. Improved with weight loss but stil problematic if she stands or sits all day. Compression socks did not help much Will try prn lasix F/u 1 m Check labs then

## 2021-11-16 NOTE — Assessment & Plan Note (Signed)
Improvement from highest BMI of 55 to 39 cooncerned about plateau wellbutrin made her feel funny We will try 3 m of phentermine. She knows it is short term tx She has started going to gym 5 days a week and I encouraged hertot conitnue

## 2021-11-22 ENCOUNTER — Ambulatory Visit: Payer: Self-pay | Admitting: Skilled Nursing Facility1

## 2021-12-13 ENCOUNTER — Other Ambulatory Visit: Payer: Self-pay | Admitting: Family Medicine

## 2021-12-13 MED ORDER — PHENTERMINE HCL 37.5 MG PO CAPS: 37.5000 mg | ORAL_CAPSULE | ORAL | 0 refills | Status: DC

## 2021-12-18 ENCOUNTER — Telehealth: Payer: Self-pay | Admitting: *Deleted

## 2021-12-18 NOTE — Telephone Encounter (Signed)
Received refill request for pts phentermine from [facility] which is not in pts chart.  Contacted pt to see if we needed to send there and she said we did not. Malasia Torain Zimmerman Rumple, CMA

## 2021-12-26 ENCOUNTER — Ambulatory Visit (HOSPITAL_BASED_OUTPATIENT_CLINIC_OR_DEPARTMENT_OTHER): Payer: BC Managed Care – PPO | Admitting: Family Medicine

## 2021-12-26 ENCOUNTER — Encounter: Payer: Self-pay | Admitting: Family Medicine

## 2021-12-26 VITALS — BP 108/68 | HR 86 | Wt 236.0 lb

## 2021-12-26 DIAGNOSIS — R42 Dizziness and giddiness: Secondary | ICD-10-CM | POA: Diagnosis not present

## 2021-12-26 DIAGNOSIS — R252 Cramp and spasm: Secondary | ICD-10-CM | POA: Diagnosis not present

## 2021-12-26 DIAGNOSIS — Z6839 Body mass index (BMI) 39.0-39.9, adult: Secondary | ICD-10-CM | POA: Diagnosis not present

## 2021-12-26 MED ORDER — PHENTERMINE HCL 37.5 MG PO CAPS: 37.5000 mg | ORAL_CAPSULE | ORAL | 0 refills | Status: DC

## 2021-12-26 NOTE — Progress Notes (Signed)
    CHIEF COMPLAINT / HPI: Follow-up weight loss since starting phentermine.  Has had some additional weight loss and is really excited about that.  Has done well with the phentermine although the first week she felt a little jittery.  This week however she has felt a little bit fatigued and has occasionally been lightheaded.  She is also noted some lower extremity cramps.  We had stopped the Lasix so she is not sure where the cramps are coming from.   PERTINENT  PMH / PSH: I have reviewed the patient's medications, allergies, past medical and surgical history, smoking status and updated in the EMR as appropriate.   OBJECTIVE:  BP 108/68   Pulse 86   Wt 236 lb (107 kg)   LMP 12/05/2021   SpO2 98%   BMI 36.96 kg/m  Vital signs reviewed. GENERAL: Well-developed, well-nourished, no acute distress. CARDIOVASCULAR: Regular rate and rhythm no murmur gallop or rub LUNGS: Clear to auscultation bilaterally, no rales or wheeze. ABDOMEN: Soft positive bowel sounds NEURO: No gross focal neurological deficits. MSK: Movement of extremity x 4. PSYCH: AxOx4. Good eye contact.. No psychomotor retardation or agitation. Appropriate speech fluency and content. Asks and answers questions appropriately. Mood is congruent.   ASSESSMENT / PLAN: #1.  Lightheadedness: We will check some labs to make sure we do not have an issue with hemoglobin or thyroid etc.  It may be a side effect of the phentermine.  She would like to do it for the final month and I have refilled that. #2.  Regarding her weight loss, she is still doing really well.  She is continuing on the activity and lifestyle modifications.  No problem-specific Assessment & Plan notes found for this encounter.   Dorcas Mcmurray MD

## 2021-12-26 NOTE — Patient Instructions (Signed)
I have called in your refill of phentermine.  We will get some labs today and I will call you and/or send you a note about those.  If you are not feeling back to your self by early next week, please let me know.  Great to see you!

## 2021-12-27 LAB — CBC
Hematocrit: 34.1 % (ref 34.0–46.6)
Hemoglobin: 10.7 g/dL — ABNORMAL LOW (ref 11.1–15.9)
MCH: 21 pg — ABNORMAL LOW (ref 26.6–33.0)
MCHC: 31.4 g/dL — ABNORMAL LOW (ref 31.5–35.7)
MCV: 67 fL — ABNORMAL LOW (ref 79–97)
Platelets: 358 10*3/uL (ref 150–450)
RBC: 5.09 x10E6/uL (ref 3.77–5.28)
RDW: 17.6 % — ABNORMAL HIGH (ref 11.7–15.4)
WBC: 6.6 10*3/uL (ref 3.4–10.8)

## 2021-12-27 LAB — COMPREHENSIVE METABOLIC PANEL
ALT: 13 IU/L (ref 0–32)
AST: 13 IU/L (ref 0–40)
Albumin/Globulin Ratio: 1.3 (ref 1.2–2.2)
Albumin: 3.9 g/dL (ref 3.9–4.9)
Alkaline Phosphatase: 68 IU/L (ref 44–121)
BUN/Creatinine Ratio: 18 (ref 9–23)
BUN: 17 mg/dL (ref 6–24)
Bilirubin Total: 0.4 mg/dL (ref 0.0–1.2)
CO2: 25 mmol/L (ref 20–29)
Calcium: 9.3 mg/dL (ref 8.7–10.2)
Chloride: 102 mmol/L (ref 96–106)
Creatinine, Ser: 0.96 mg/dL (ref 0.57–1.00)
Globulin, Total: 2.9 g/dL (ref 1.5–4.5)
Glucose: 99 mg/dL (ref 70–99)
Potassium: 4.5 mmol/L (ref 3.5–5.2)
Sodium: 139 mmol/L (ref 134–144)
Total Protein: 6.8 g/dL (ref 6.0–8.5)
eGFR: 74 mL/min/{1.73_m2} (ref >59)

## 2021-12-27 LAB — TSH: TSH: 0.679 u[IU]/mL (ref 0.450–4.500)

## 2021-12-28 ENCOUNTER — Encounter: Payer: Self-pay | Admitting: Family Medicine

## 2022-02-17 DIAGNOSIS — R059 Cough, unspecified: Secondary | ICD-10-CM | POA: Diagnosis not present

## 2022-02-17 DIAGNOSIS — J101 Influenza due to other identified influenza virus with other respiratory manifestations: Secondary | ICD-10-CM | POA: Diagnosis not present

## 2022-02-25 ENCOUNTER — Other Ambulatory Visit: Payer: Self-pay | Admitting: Family Medicine

## 2022-02-25 MED ORDER — ACETAMINOPHEN-CODEINE 300-30 MG PO TABS: 1.0000 | ORAL_TABLET | Freq: Four times a day (QID) | ORAL | 0 refills | Status: AC | PRN

## 2022-02-25 NOTE — Progress Notes (Signed)
Text and phone call from patient. Has developed cough very similar to her Mom's cough. Not feeling that bad but cannot sleep and coughing all day. Nonproductive. Will call in some cough medicine

## 2022-04-10 ENCOUNTER — Encounter: Payer: Self-pay | Admitting: Family Medicine

## 2022-04-10 ENCOUNTER — Ambulatory Visit (INDEPENDENT_AMBULATORY_CARE_PROVIDER_SITE_OTHER): Payer: BC Managed Care – PPO | Admitting: Family Medicine

## 2022-04-10 VITALS — BP 110/64 | HR 73 | Ht 67.0 in | Wt 237.2 lb

## 2022-04-10 DIAGNOSIS — Z0000 Encounter for general adult medical examination without abnormal findings: Secondary | ICD-10-CM

## 2022-04-10 NOTE — Progress Notes (Signed)
    CHIEF COMPLAINT / HPI: Well adult health check She is in need of Pap smear but started her menstrual cycle this morning would like to delay that till next office visit.  Otherwise she is doing pretty well.  She is gained a little bit of the weight back but she is planning to get back into her gym routine soon.  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   PERTINENT  PMH / PSH: I have reviewed the patient's medications, allergies, past medical and surgical history, smoking status and updated in the EMR as appropriate.   OBJECTIVE:  BP 110/64   Pulse 73   Ht 5\' 7"  (1.702 m)   Wt 237 lb 3.2 oz (107.6 kg)   LMP 04/08/2022   SpO2 99%   BMI 37.15 kg/m  Vital signs reviewed. GENERAL: Well-developed, well-nourished, no acute distress. CARDIOVASCULAR: Regular rate and rhythm no murmur gallop or rub LUNGS: Clear to auscultation bilaterally, no rales or wheeze. ABDOMEN: Soft positive bowel sounds NEURO: No gross focal neurological deficits. MSK: Movement of extremity x 4. PSYCH: AxOx4. Good eye contact.. No psychomotor retardation or agitation. Appropriate speech fluency and content. Asks and answers questions appropriately. Mood is congruent.   ASSESSMENT / PLAN: Well adult health check: She needs to return for Pap smear that is quite overdue.  Gave her information on colonoscopy screening and recommend that.  Labs today.  Letter given for her insurance that she had physical done today.  Discussed mammogram.  Would not recommend until age 52 2.  Regarding weight loss she has been very successful having lost over 100 pounds in total.  Urged her to continue to work on getting back to the exercise program.  Would not recommend any additional medication intervention at this time.  No problem-specific Assessment & Plan notes found for this encounter.   Dorcas Mcmurray MD

## 2022-04-10 NOTE — Patient Instructions (Signed)
I will send you note about your lab work.  Everything else looks fine.  You are due a Pap smear in please make an appointment with me for that at your convenience in the next month or so.  Regarding health maintenance, we typically do not recommend a mammogram until age 47.  Guidelines for colonoscopy screening have recently changed however and they started 45 so you are eligible for screening colonoscopy.  I have given you paperwork on how to set that up.  Please make an appointment see me back for her Pap smear.  It was great to see you!

## 2022-04-12 ENCOUNTER — Other Ambulatory Visit: Payer: BC Managed Care – PPO

## 2022-04-12 DIAGNOSIS — Z0000 Encounter for general adult medical examination without abnormal findings: Secondary | ICD-10-CM | POA: Diagnosis not present

## 2022-04-13 LAB — BASIC METABOLIC PANEL
BUN/Creatinine Ratio: 22 (ref 9–23)
BUN: 22 mg/dL (ref 6–24)
CO2: 23 mmol/L (ref 20–29)
Calcium: 9.4 mg/dL (ref 8.7–10.2)
Chloride: 102 mmol/L (ref 96–106)
Creatinine, Ser: 0.98 mg/dL (ref 0.57–1.00)
Glucose: 84 mg/dL (ref 70–99)
Potassium: 4.4 mmol/L (ref 3.5–5.2)
Sodium: 141 mmol/L (ref 134–144)
eGFR: 72 mL/min/{1.73_m2} (ref >59)

## 2022-04-13 LAB — LIPID PANEL
Chol/HDL Ratio: 2.5 ratio (ref 0.0–4.4)
Cholesterol, Total: 199 mg/dL (ref 100–199)
HDL: 81 mg/dL (ref >39)
LDL Chol Calc (NIH): 110 mg/dL — ABNORMAL HIGH (ref 0–99)
Triglycerides: 40 mg/dL (ref 0–149)
VLDL Cholesterol Cal: 8 mg/dL (ref 5–40)

## 2022-04-16 ENCOUNTER — Encounter: Payer: Self-pay | Admitting: Family Medicine

## 2022-07-26 ENCOUNTER — Other Ambulatory Visit: Payer: Self-pay | Admitting: Family Medicine

## 2022-07-26 MED ORDER — AMOXICILLIN 875 MG PO TABS: 875.0000 mg | ORAL_TABLET | Freq: Two times a day (BID) | ORAL | 0 refills | Status: DC

## 2022-08-07 ENCOUNTER — Other Ambulatory Visit: Payer: Self-pay | Admitting: Family Medicine

## 2022-08-08 MED ORDER — PHENTERMINE HCL 37.5 MG PO CAPS: 37.5000 mg | ORAL_CAPSULE | ORAL | 0 refills | Status: DC

## 2022-08-08 NOTE — Telephone Encounter (Signed)
We had discussed via phone trying this again for brief time so will refill

## 2022-10-01 ENCOUNTER — Encounter: Payer: Self-pay | Admitting: Family Medicine

## 2022-10-01 ENCOUNTER — Ambulatory Visit (HOSPITAL_BASED_OUTPATIENT_CLINIC_OR_DEPARTMENT_OTHER)
Admission: RE | Admit: 2022-10-01 | Discharge: 2022-10-01 | Disposition: A | Discharge status: Home / Self Care | Payer: BC Managed Care – PPO | Source: Ambulatory Visit | Attending: Family Medicine | Admitting: Family Medicine

## 2022-10-01 ENCOUNTER — Telehealth: Payer: Self-pay

## 2022-10-01 ENCOUNTER — Ambulatory Visit: Payer: BC Managed Care – PPO | Admitting: Family Medicine

## 2022-10-01 ENCOUNTER — Telehealth: Payer: Self-pay | Admitting: Family Medicine

## 2022-10-01 VITALS — BP 122/70 | HR 102 | Temp 98.1°F | Wt 245.0 lb

## 2022-10-01 DIAGNOSIS — U071 COVID-19: Secondary | ICD-10-CM

## 2022-10-01 DIAGNOSIS — J069 Acute upper respiratory infection, unspecified: Secondary | ICD-10-CM

## 2022-10-01 DIAGNOSIS — Z6839 Body mass index (BMI) 39.0-39.9, adult: Secondary | ICD-10-CM

## 2022-10-01 DIAGNOSIS — R0781 Pleurodynia: Secondary | ICD-10-CM

## 2022-10-01 DIAGNOSIS — R918 Other nonspecific abnormal finding of lung field: Secondary | ICD-10-CM | POA: Diagnosis not present

## 2022-10-01 LAB — POC SOFIA SARS ANTIGEN FIA: SARS Coronavirus 2 Ag: POSITIVE — AB

## 2022-10-01 MED ORDER — IOHEXOL 300 MG/ML  SOLN
100.0000 mL | Freq: Once | INTRAMUSCULAR | Status: AC | PRN
  Administered 2022-10-01: 100 mL via INTRAVENOUS

## 2022-10-01 MED ORDER — NIRMATRELVIR/RITONAVIR (PAXLOVID)TABLET: 3.0000 | ORAL_TABLET | Freq: Two times a day (BID) | ORAL | 0 refills | Status: AC

## 2022-10-01 MED ORDER — PHENTERMINE HCL 37.5 MG PO CAPS: 37.5000 mg | ORAL_CAPSULE | ORAL | 0 refills | Status: DC

## 2022-10-01 NOTE — Patient Instructions (Addendum)
Please go to [facility] to obtain a CT scan of your chest to make sure you do not have a blood clot. If this comes back negative - continue doing what you are doing to take care of your cold! If you start having lots of fevers or shortness of breath, please let us know or seek medical attention.

## 2022-10-01 NOTE — Progress Notes (Addendum)
SUBJECTIVE:   CHIEF COMPLAINT / HPI:   Not feeling well Last week, husband was sick (feverish, cough, diarrhea), now she isn't feeling well Has been having nausea, diarrhea, vomiting, body aches. Has been using dayquil and nyquil. Difficult to keep down food/drink.  She reports 4 episodes of coughing up blood-tinged sputum since Sunday. Sometimes has "needle like feeling" when she takes deep breaths but this is not occurring currently. No shortness of breath or chest pain currently. No leg swelling or leg pain. No recent travel, no hx of cancer.  No personal hx of bleeding disorder or DVT/PE. Father and sister have a hx of DVTs.  Feeling congested. Does endorse sinus pain.  Requesting phentermine refill for weight loss. She has been doing well on this.  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   Class 3 obesity without serious comorbidity with body mass index (BMI) of 50.0 to 59.9 in adult    DEPRESSION, SEVERE 08/09/2009   Qualifier: Diagnosis of  By: [place] MD, Agustina Caroli, ATOPIC 08/30/2009   Qualifier: Diagnosis of  By: [place] MD, Sarah     GERD (gastroesophageal reflux disease)    Mild   GRIEF REACTION, ACUTE 07/04/2009   Qualifier: Diagnosis of  By: [place] MD, Sarah     Heart murmur    Hereditary and idiopathic peripheral neuropathy 07/23/2016   01/12/2019 not currently having problems   History of hiatal hernia    Small   Iron deficiency anemia 07/2016   KNEE PAIN, RIGHT 11/01/2008   Qualifier: Diagnosis of  By: Darrick Penna MD, KARL     Lower extremity edema    MUSCLE CRAMPS 07/04/2009   Qualifier: Diagnosis of  By: [place] MD,  Sarah     OSA (obstructive sleep apnea)    recent diagnosis, no CPAP received at this time    Pallor 07/17/2009   Qualifier: Diagnosis of  By: [place] MD, Sarah     Paresthesia 09/04/2016   Plantar fasciitis, left 09/10/2011   PONV (postoperative nausea and vomiting)    nausea only   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   We'll start her on capsaicin cream topically    PRURITUS, EARS 07/06/2008   Qualifier: Diagnosis of  By: Clelia Croft MD, Kimberlee     Severe obesity (BMI >= 40) (HCC) 07/06/2008   Qualifier: Diagnosis of  By: Clelia Croft MD, Kimberlee     SUBACROMIAL BURSITIS, RIGHT 07/04/2009   Qualifier: Diagnosis of  By: Rexene Alberts  MD, Aurther Loft      OBJECTIVE:  BP 122/70   Pulse (!) 102   Temp 98.1 F (36.7 C) (Oral)   Wt 245 lb (111.1 kg)   LMP 09/24/2022   BMI 38.37 kg/m   General: NAD, pleasant, able to participate in exam Cardiac: tachycardic, regular rhythm Respiratory: CTAB, normal WOB on RA Abdomen: soft, non-tender, non-distended, normoactive bowel sounds Extremities: warm and well perfused, no edema or cyanosis Skin: warm and dry, no rashes noted Neuro: alert, no obvious focal deficits, speech normal Psych: Normal affect and mood  ASSESSMENT/PLAN:    1. Pleuritic  chest pain With hemoptysis and tachycardia. Feel that a majority of her symptoms are viral in nature; however, given multiple episodes of hemoptysis with reported pleuritic chest pain and tachycardia, and reported family hx of blood clots, I am concerned for PE. Moderate Wells score. Reassuringly she is currently breathing normally on room air. - CT Angio Chest Pulmonary Embolism (PE) W or WO Contrast; Future  2. Viral URI with cough / COVID-19 COVID positive in clinic today which is likely the source of most of her symptoms including URI and GI component as well. Ruling out PE as noted above given her hemoptysis, pleuritic chest pain, and tachycardia. Encouraged supportive care otherwise.  3. Adult BMI 39.0-39.9 kg/sq  m refilled - phentermine 37.5 MG capsule; Take 1 capsule (37.5 mg total) by mouth every morning.  Dispense: 30 capsule; Refill: 0  Meds ordered this encounter  Medications   phentermine 37.5 MG capsule    Sig: Take 1 capsule (37.5 mg total) by mouth every morning.    Dispense:  30 capsule    Refill:  0   Return if symptoms worsen or fail to improve.  Vonna Drafts, MD [facility]

## 2022-10-01 NOTE — Telephone Encounter (Signed)
Patient calls nurse line in regards to positive covid test.   She reports she saw result in mychart.   She reports she is interested in antiviral therapy.   She reports symptoms started on Sunday. She reports her main symptoms are body aches and diarrhea.   She reports her work is requesting a return to work date from provider.   Will forward to provider who saw patient.

## 2022-10-01 NOTE — Telephone Encounter (Signed)
Called patient to discuss positive covid result and negative CTA chest.   Discussed risks/benefits of paxlovid and pt elected to proceed - reviewed medication regimen in detail with patient. She expressed understanding. Sent 5 day supply to pharmacy.  Provided work note for pt to be out of work until 7/12. Sent via [employer].

## 2022-11-08 ENCOUNTER — Other Ambulatory Visit: Payer: Self-pay | Admitting: Family Medicine

## 2022-11-08 DIAGNOSIS — Z6839 Body mass index (BMI) 39.0-39.9, adult: Secondary | ICD-10-CM

## 2022-12-20 ENCOUNTER — Telehealth: Payer: Self-pay | Admitting: Family Medicine

## 2022-12-20 MED ORDER — TRAMADOL HCL 50 MG PO TABS: 50.0000 mg | ORAL_TABLET | Freq: Four times a day (QID) | ORAL | 0 refills | Status: AC | PRN

## 2022-12-20 MED ORDER — PENICILLIN V POTASSIUM 500 MG PO TABS: 500.0000 mg | ORAL_TABLET | Freq: Four times a day (QID) | ORAL | 0 refills | Status: AC

## 2022-12-20 NOTE — Telephone Encounter (Signed)
Toothache and has appointment Monday tohave it extracted. Needs something for pain until. I would alos recommend antibiotic. No fever.

## 2023-01-13 ENCOUNTER — Other Ambulatory Visit: Payer: Self-pay | Admitting: Family Medicine

## 2023-01-13 DIAGNOSIS — Z6839 Body mass index (BMI) 39.0-39.9, adult: Secondary | ICD-10-CM

## 2023-01-13 MED ORDER — TIRZEPATIDE-WEIGHT MANAGEMENT 2.5 MG/0.5ML ~~LOC~~ SOLN: 2.5000 mg | SUBCUTANEOUS | 1 refills | Status: DC

## 2023-01-13 NOTE — Progress Notes (Signed)
Received request from pt to try tirzepatide. I told her I would send a rx in but not sure insurance will cover.

## 2023-02-10 ENCOUNTER — Other Ambulatory Visit: Payer: Self-pay | Admitting: Family Medicine

## 2023-02-10 MED ORDER — PHENTERMINE-TOPIRAMATE ER 3.75-23 MG PO CP24: 1.0000 | ORAL_CAPSULE | Freq: Every morning | ORAL | 0 refills | Status: AC

## 2023-02-10 NOTE — Progress Notes (Signed)
Spoke w pt She is frustrated as weight gain is occurring again. Will make appt. We had previously discussed Qsymia and she wasn't to try it.

## 2023-03-31 DIAGNOSIS — K219 Gastro-esophageal reflux disease without esophagitis: Secondary | ICD-10-CM | POA: Diagnosis not present

## 2023-03-31 DIAGNOSIS — Z01818 Encounter for other preprocedural examination: Secondary | ICD-10-CM | POA: Diagnosis not present

## 2023-03-31 DIAGNOSIS — D509 Iron deficiency anemia, unspecified: Secondary | ICD-10-CM | POA: Diagnosis not present

## 2023-03-31 DIAGNOSIS — Z9884 Bariatric surgery status: Secondary | ICD-10-CM | POA: Diagnosis not present

## 2023-04-03 DIAGNOSIS — K219 Gastro-esophageal reflux disease without esophagitis: Secondary | ICD-10-CM | POA: Diagnosis not present

## 2023-04-03 DIAGNOSIS — Z9884 Bariatric surgery status: Secondary | ICD-10-CM | POA: Diagnosis not present

## 2023-04-03 DIAGNOSIS — Z4682 Encounter for fitting and adjustment of non-vascular catheter: Secondary | ICD-10-CM | POA: Diagnosis not present

## 2023-04-03 DIAGNOSIS — R635 Abnormal weight gain: Secondary | ICD-10-CM | POA: Diagnosis not present

## 2023-04-08 DIAGNOSIS — Z713 Dietary counseling and surveillance: Secondary | ICD-10-CM | POA: Diagnosis not present

## 2023-04-23 DIAGNOSIS — Z713 Dietary counseling and surveillance: Secondary | ICD-10-CM | POA: Diagnosis not present

## 2023-04-29 DIAGNOSIS — F4320 Adjustment disorder, unspecified: Secondary | ICD-10-CM | POA: Diagnosis not present

## 2023-04-29 DIAGNOSIS — Z903 Acquired absence of stomach [part of]: Secondary | ICD-10-CM | POA: Diagnosis not present

## 2023-05-01 DIAGNOSIS — D509 Iron deficiency anemia, unspecified: Secondary | ICD-10-CM | POA: Diagnosis not present

## 2023-05-01 DIAGNOSIS — Z713 Dietary counseling and surveillance: Secondary | ICD-10-CM | POA: Diagnosis not present

## 2023-05-01 DIAGNOSIS — Z9884 Bariatric surgery status: Secondary | ICD-10-CM | POA: Diagnosis not present

## 2023-05-28 ENCOUNTER — Ambulatory Visit: Admitting: Sports Medicine

## 2023-05-29 ENCOUNTER — Encounter: Payer: Self-pay | Admitting: Family Medicine

## 2023-05-29 ENCOUNTER — Ambulatory Visit (INDEPENDENT_AMBULATORY_CARE_PROVIDER_SITE_OTHER): Admitting: Family Medicine

## 2023-05-29 VITALS — BP 118/70 | Ht 67.0 in | Wt 265.0 lb

## 2023-05-29 DIAGNOSIS — M2142 Flat foot [pes planus] (acquired), left foot: Secondary | ICD-10-CM

## 2023-05-29 DIAGNOSIS — M2141 Flat foot [pes planus] (acquired), right foot: Secondary | ICD-10-CM | POA: Diagnosis not present

## 2023-05-29 DIAGNOSIS — Z713 Dietary counseling and surveillance: Secondary | ICD-10-CM | POA: Diagnosis not present

## 2023-05-29 DIAGNOSIS — M2042 Other hammer toe(s) (acquired), left foot: Secondary | ICD-10-CM

## 2023-05-29 DIAGNOSIS — M2041 Other hammer toe(s) (acquired), right foot: Secondary | ICD-10-CM | POA: Diagnosis not present

## 2023-05-29 DIAGNOSIS — M722 Plantar fascial fibromatosis: Secondary | ICD-10-CM

## 2023-05-29 DIAGNOSIS — Z6841 Body Mass Index (BMI) 40.0 and over, adult: Secondary | ICD-10-CM | POA: Diagnosis not present

## 2023-05-29 DIAGNOSIS — Z903 Acquired absence of stomach [part of]: Secondary | ICD-10-CM | POA: Diagnosis not present

## 2023-05-29 NOTE — Progress Notes (Signed)
 DATE OF VISIT: 05/29/2023        Sara [name] DOB: 06-05-1975 MRN: [id number]  CC:  Rt foot pain  History of present Illness: Sara [name] is a 48 y.o. female who presents for right foot pain  Having pain in the right foot for approximately last 3 weeks Denies any specific injury or trauma, but did start a new dance [employer] program -Each session is approximately 30 minutes She has had current pair shoes for over 3 years and they are quite worn In addition to starting the dance program, she has been working on her feet more.  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   Pain along the medial foot Worse in the morning upon waking Does persist throughout the day Tried Dr Doug Sou inserts - no help Tried Lidocaine patch - no help Also having some left medial ankle pain - thinks due to compensation. No foot pain on the left, does have history of plantar fasciitis on the left and was seen by [place] in 2013  Medications:  Outpatient Encounter Medications as of 05/29/2023  Medication Sig   fluticasone (FLONASE) 50 MCG/ACT nasal spray Place 2 sprays into both nostrils daily. (Patient not taking: Reported on 10/01/2022)   penicillin v potassium (VEETID) 500 MG tablet Take 1 tablet (500 mg total) by mouth 4 (four) times daily.   Phentermine-Topiramate 3.75-23 MG CP24 Take 1 capsule by mouth every morning.   ZEPBOUND 2.5 MG/0.5ML injection vial INJECT 2.5 MG SUBCUTANEOUSLY WEEKLY   No facility-administered encounter medications on file as of 05/29/2023.    Allergies: is allergic to pork-derived products.  Physical Examination: Vitals: BP 118/70   Ht 5\' 7"  (1.702 m)   Wt 265 lb (120.2 kg)   BMI 41.50 kg/m  GENERAL:  Sara [name] is a 48 y.o. female appearing their stated age, alert and oriented x 3, in no apparent distress.  SKIN: no rashes or lesions, skin clean, dry, intact MSK: Foot/ankle: Bilateral feet with pes planus associated transverse arch collapse with  hammering of right second and third toes with some bossing over the dorsal aspect of the second and third toes.  Early hammering of second and third toes on the left, no dorsal bossing.  She is tender palpation along the insertion of the plantar fascia along right medial calcaneus.  No tenderness on the left. She is walking with a antalgic gait NEURO: sensation intact to light touch VASC: pulses 2+ and symmetric DP/PT bilaterally, no edema  Assessment & Plan Plantar fasciitis, right Acute right foot pain x 3 weeks with associated pes planus and hammertoes -Likely related to increased activity, new exercise program, worn-out footwear  Plan: -Brief bedside ultrasound showing no heel spur.  Normal-appearing plantar fascia.  Images were not saved. -Patient was fitted with Sports Insoles with bilateral medial scaphoid pads and bilateral medial heel wedges.  These were more comfortable in the office and she had a more neutral gait -Should avoid walking barefoot.  Recommend she purchase house shoes such as Birkenstocks or crocs -Home exercise program for plantar fasciitis provided -Continue frozen water bottle massages as needed -Recommend purchasing new footwear with good support -Follow-up 6 weeks for reevaluation, if no improvement could consider ultrasound-guided injection versus consideration of custom orthotics Bilateral pes planus Acute right foot pain x 3 weeks with associated pes planus and hammertoes -Likely related to increased activity, new exercise program, worn-out footwear  Plan: -Brief bedside ultrasound showing no heel spur.  Normal-appearing plantar fascia.  Images were not saved. -Patient was fitted with Sports Insoles with bilateral medial scaphoid pads and bilateral medial heel wedges.  These were more comfortable in the office and she had a more neutral gait -Should avoid walking barefoot.  Recommend she purchase house shoes such as Birkenstocks or crocs -Home exercise  program for plantar fasciitis provided -Continue frozen water bottle massages as needed -Recommend purchasing new footwear with good support -Follow-up 6 weeks for reevaluation, if no improvement could consider ultrasound-guided injection versus consideration of custom orthotics Hammer toes of both feet Acute right foot pain x 3 weeks with associated pes planus and hammertoes -Likely related to increased activity, new exercise program, worn-out footwear  Plan: -Brief bedside ultrasound showing no heel spur.  Normal-appearing plantar fascia.  Images were not saved. -Patient was fitted with Sports Insoles with bilateral medial scaphoid pads and bilateral medial heel wedges.  These were more comfortable in the office and she had a more neutral gait -Should avoid walking barefoot.  Recommend she purchase house shoes such as Birkenstocks or crocs -Home exercise program for plantar fasciitis provided -Continue frozen water bottle massages as needed -Recommend purchasing new footwear with good support -Follow-up 6 weeks for reevaluation, if no improvement could consider ultrasound-guided injection versus consideration of custom orthotics  Patient expressed understanding & agreement with above.  Encounter Diagnoses  Name Primary?   Plantar fasciitis, right Yes   Bilateral pes planus    Hammer toes of both feet     No orders of the defined types were placed in this encounter.

## 2023-06-13 DIAGNOSIS — Z713 Dietary counseling and surveillance: Secondary | ICD-10-CM | POA: Diagnosis not present

## 2023-06-26 DIAGNOSIS — Z713 Dietary counseling and surveillance: Secondary | ICD-10-CM | POA: Diagnosis not present

## 2023-06-26 DIAGNOSIS — Z9884 Bariatric surgery status: Secondary | ICD-10-CM | POA: Diagnosis not present

## 2023-06-26 DIAGNOSIS — D509 Iron deficiency anemia, unspecified: Secondary | ICD-10-CM | POA: Diagnosis not present

## 2023-07-02 DIAGNOSIS — Z01818 Encounter for other preprocedural examination: Secondary | ICD-10-CM | POA: Diagnosis not present

## 2023-07-02 DIAGNOSIS — E66813 Obesity, class 3: Secondary | ICD-10-CM | POA: Diagnosis not present

## 2023-07-02 DIAGNOSIS — I872 Venous insufficiency (chronic) (peripheral): Secondary | ICD-10-CM | POA: Diagnosis not present

## 2023-07-02 DIAGNOSIS — D509 Iron deficiency anemia, unspecified: Secondary | ICD-10-CM | POA: Diagnosis not present

## 2023-07-02 DIAGNOSIS — G4733 Obstructive sleep apnea (adult) (pediatric): Secondary | ICD-10-CM | POA: Diagnosis not present

## 2023-07-02 DIAGNOSIS — D508 Other iron deficiency anemias: Secondary | ICD-10-CM | POA: Diagnosis not present

## 2023-07-09 ENCOUNTER — Encounter: Payer: Self-pay | Admitting: Sports Medicine

## 2023-07-10 ENCOUNTER — Ambulatory Visit: Admitting: Sports Medicine

## 2023-07-21 DIAGNOSIS — Z713 Dietary counseling and surveillance: Secondary | ICD-10-CM | POA: Diagnosis not present

## 2023-07-29 DIAGNOSIS — Z01818 Encounter for other preprocedural examination: Secondary | ICD-10-CM | POA: Diagnosis not present

## 2023-07-29 DIAGNOSIS — D508 Other iron deficiency anemias: Secondary | ICD-10-CM | POA: Diagnosis not present

## 2023-07-29 DIAGNOSIS — Z903 Acquired absence of stomach [part of]: Secondary | ICD-10-CM | POA: Diagnosis not present

## 2023-08-11 DIAGNOSIS — Z6841 Body Mass Index (BMI) 40.0 and over, adult: Secondary | ICD-10-CM | POA: Diagnosis not present

## 2023-08-11 DIAGNOSIS — K429 Umbilical hernia without obstruction or gangrene: Secondary | ICD-10-CM | POA: Diagnosis not present

## 2023-08-11 DIAGNOSIS — Z79899 Other long term (current) drug therapy: Secondary | ICD-10-CM | POA: Diagnosis not present

## 2023-08-11 DIAGNOSIS — M2240 Chondromalacia patellae, unspecified knee: Secondary | ICD-10-CM | POA: Diagnosis not present

## 2023-08-11 DIAGNOSIS — I872 Venous insufficiency (chronic) (peripheral): Secondary | ICD-10-CM | POA: Diagnosis not present

## 2023-08-12 DIAGNOSIS — Z79899 Other long term (current) drug therapy: Secondary | ICD-10-CM | POA: Diagnosis not present

## 2023-08-12 DIAGNOSIS — Z6841 Body Mass Index (BMI) 40.0 and over, adult: Secondary | ICD-10-CM | POA: Diagnosis not present

## 2023-08-12 DIAGNOSIS — E66813 Obesity, class 3: Secondary | ICD-10-CM | POA: Diagnosis not present

## 2023-08-26 DIAGNOSIS — F4320 Adjustment disorder, unspecified: Secondary | ICD-10-CM | POA: Diagnosis not present

## 2023-09-08 DIAGNOSIS — Z6838 Body mass index (BMI) 38.0-38.9, adult: Secondary | ICD-10-CM | POA: Diagnosis not present

## 2023-09-08 DIAGNOSIS — Z713 Dietary counseling and surveillance: Secondary | ICD-10-CM | POA: Diagnosis not present

## 2023-09-08 DIAGNOSIS — Z9884 Bariatric surgery status: Secondary | ICD-10-CM | POA: Diagnosis not present

## 2023-09-08 DIAGNOSIS — E669 Obesity, unspecified: Secondary | ICD-10-CM | POA: Diagnosis not present

## 2023-09-15 DIAGNOSIS — E66812 Obesity, class 2: Secondary | ICD-10-CM | POA: Diagnosis not present

## 2023-09-15 DIAGNOSIS — Z713 Dietary counseling and surveillance: Secondary | ICD-10-CM | POA: Diagnosis not present

## 2023-09-15 DIAGNOSIS — Z9884 Bariatric surgery status: Secondary | ICD-10-CM | POA: Diagnosis not present

## 2023-09-15 DIAGNOSIS — D508 Other iron deficiency anemias: Secondary | ICD-10-CM | POA: Diagnosis not present

## 2024-01-01 DIAGNOSIS — E569 Vitamin deficiency, unspecified: Secondary | ICD-10-CM | POA: Diagnosis not present

## 2024-03-02 DIAGNOSIS — Z9884 Bariatric surgery status: Secondary | ICD-10-CM | POA: Diagnosis not present

## 2024-03-02 DIAGNOSIS — Z6830 Body mass index (BMI) 30.0-30.9, adult: Secondary | ICD-10-CM | POA: Diagnosis not present

## 2024-03-02 DIAGNOSIS — E669 Obesity, unspecified: Secondary | ICD-10-CM | POA: Diagnosis not present

## 2024-03-02 DIAGNOSIS — Z713 Dietary counseling and surveillance: Secondary | ICD-10-CM | POA: Diagnosis not present
